# Patient Record
Sex: Female | Born: 1960
Health system: Southern US, Community
[De-identification: ages and names within clinical notes are randomized; demographics above are authoritative.]

## PROBLEM LIST (undated history)

## (undated) ENCOUNTER — Emergency Department (HOSPITAL_COMMUNITY): Payer: Medicare Other

## (undated) DIAGNOSIS — I739 Peripheral vascular disease, unspecified: Secondary | ICD-10-CM

## (undated) DIAGNOSIS — C801 Malignant (primary) neoplasm, unspecified: Secondary | ICD-10-CM

## (undated) DIAGNOSIS — I10 Essential (primary) hypertension: Secondary | ICD-10-CM

## (undated) DIAGNOSIS — E785 Hyperlipidemia, unspecified: Secondary | ICD-10-CM

## (undated) DIAGNOSIS — I2510 Atherosclerotic heart disease of native coronary artery without angina pectoris: Secondary | ICD-10-CM

## (undated) DIAGNOSIS — E0500 Thyrotoxicosis with diffuse goiter without thyrotoxic crisis or storm: Secondary | ICD-10-CM

## (undated) DIAGNOSIS — D6859 Other primary thrombophilia: Secondary | ICD-10-CM

## (undated) HISTORY — PX: COLON SURGERY: SHX602

## (undated) HISTORY — PX: ABDOMINAL HYSTERECTOMY: SHX81

## (undated) HISTORY — PX: ABDOMINAL SURGERY: SHX537

## (undated) HISTORY — DX: Malignant (primary) neoplasm, unspecified: C80.1

## (undated) HISTORY — DX: Hyperlipidemia, unspecified: E78.5

## (undated) HISTORY — DX: Peripheral vascular disease, unspecified: I73.9

---

## 1998-11-01 ENCOUNTER — Other Ambulatory Visit: Admission: RE | Admit: 1998-11-01 | Discharge: 1998-11-01 | Payer: Self-pay | Admitting: Obstetrics & Gynecology

## 1999-04-15 ENCOUNTER — Emergency Department (HOSPITAL_COMMUNITY): Admission: EM | Admit: 1999-04-15 | Discharge: 1999-04-15 | Payer: Self-pay | Admitting: Emergency Medicine

## 1999-07-20 ENCOUNTER — Encounter (INDEPENDENT_AMBULATORY_CARE_PROVIDER_SITE_OTHER): Payer: Self-pay | Admitting: Specialist

## 1999-07-20 ENCOUNTER — Other Ambulatory Visit: Admission: RE | Admit: 1999-07-20 | Discharge: 1999-07-20 | Payer: Self-pay | Admitting: *Deleted

## 1999-10-03 ENCOUNTER — Ambulatory Visit (HOSPITAL_COMMUNITY): Admission: RE | Admit: 1999-10-03 | Discharge: 1999-10-03 | Payer: Self-pay | Admitting: Internal Medicine

## 1999-10-03 ENCOUNTER — Encounter: Payer: Self-pay | Admitting: Internal Medicine

## 1999-12-06 ENCOUNTER — Other Ambulatory Visit: Admission: RE | Admit: 1999-12-06 | Discharge: 1999-12-06 | Payer: Self-pay | Admitting: Obstetrics & Gynecology

## 2001-01-06 ENCOUNTER — Other Ambulatory Visit: Admission: RE | Admit: 2001-01-06 | Discharge: 2001-01-06 | Payer: Self-pay | Admitting: Obstetrics & Gynecology

## 2002-01-12 ENCOUNTER — Ambulatory Visit (HOSPITAL_COMMUNITY): Admission: RE | Admit: 2002-01-12 | Discharge: 2002-01-12 | Payer: Self-pay | Admitting: Internal Medicine

## 2002-01-13 ENCOUNTER — Encounter: Payer: Self-pay | Admitting: Internal Medicine

## 2002-01-14 ENCOUNTER — Ambulatory Visit (HOSPITAL_COMMUNITY): Admission: RE | Admit: 2002-01-14 | Discharge: 2002-01-14 | Payer: Self-pay | Admitting: Internal Medicine

## 2002-01-14 ENCOUNTER — Encounter: Payer: Self-pay | Admitting: Internal Medicine

## 2002-02-23 ENCOUNTER — Other Ambulatory Visit: Admission: RE | Admit: 2002-02-23 | Discharge: 2002-02-23 | Payer: Self-pay | Admitting: Obstetrics & Gynecology

## 2003-04-09 ENCOUNTER — Encounter: Admission: RE | Admit: 2003-04-09 | Discharge: 2003-04-09 | Payer: Self-pay | Admitting: Gastroenterology

## 2003-04-09 ENCOUNTER — Encounter: Payer: Self-pay | Admitting: Gastroenterology

## 2003-04-27 ENCOUNTER — Other Ambulatory Visit: Admission: RE | Admit: 2003-04-27 | Discharge: 2003-04-27 | Payer: Self-pay | Admitting: Obstetrics & Gynecology

## 2003-08-23 ENCOUNTER — Ambulatory Visit (HOSPITAL_COMMUNITY): Admission: RE | Admit: 2003-08-23 | Discharge: 2003-08-23 | Payer: Self-pay | Admitting: Gastroenterology

## 2003-08-23 ENCOUNTER — Encounter (INDEPENDENT_AMBULATORY_CARE_PROVIDER_SITE_OTHER): Payer: Self-pay | Admitting: Specialist

## 2003-09-18 ENCOUNTER — Emergency Department (HOSPITAL_COMMUNITY): Admission: EM | Admit: 2003-09-18 | Discharge: 2003-09-18 | Payer: Self-pay | Admitting: Emergency Medicine

## 2003-09-20 ENCOUNTER — Inpatient Hospital Stay (HOSPITAL_COMMUNITY): Admission: EM | Admit: 2003-09-20 | Discharge: 2003-09-22 | Payer: Self-pay | Admitting: Emergency Medicine

## 2003-09-21 ENCOUNTER — Encounter (INDEPENDENT_AMBULATORY_CARE_PROVIDER_SITE_OTHER): Payer: Self-pay | Admitting: *Deleted

## 2003-10-13 ENCOUNTER — Encounter (INDEPENDENT_AMBULATORY_CARE_PROVIDER_SITE_OTHER): Payer: Self-pay | Admitting: Specialist

## 2003-10-13 ENCOUNTER — Ambulatory Visit (HOSPITAL_COMMUNITY): Admission: RE | Admit: 2003-10-13 | Discharge: 2003-10-13 | Payer: Self-pay | Admitting: Gastroenterology

## 2003-10-20 ENCOUNTER — Ambulatory Visit (HOSPITAL_COMMUNITY): Admission: RE | Admit: 2003-10-20 | Discharge: 2003-10-20 | Payer: Self-pay | Admitting: Gastroenterology

## 2003-11-09 ENCOUNTER — Ambulatory Visit (HOSPITAL_COMMUNITY): Admission: RE | Admit: 2003-11-09 | Discharge: 2003-11-09 | Payer: Self-pay | Admitting: Gastroenterology

## 2003-11-15 ENCOUNTER — Encounter: Payer: Self-pay | Admitting: Gastroenterology

## 2003-11-16 ENCOUNTER — Ambulatory Visit (HOSPITAL_COMMUNITY): Admission: RE | Admit: 2003-11-16 | Discharge: 2003-11-16 | Payer: Self-pay | Admitting: Gastroenterology

## 2003-11-22 ENCOUNTER — Inpatient Hospital Stay (HOSPITAL_COMMUNITY): Admission: EM | Admit: 2003-11-22 | Discharge: 2003-12-11 | Payer: Self-pay | Admitting: Emergency Medicine

## 2003-11-22 HISTORY — PX: OTHER SURGICAL HISTORY: SHX169

## 2004-05-12 ENCOUNTER — Other Ambulatory Visit: Admission: RE | Admit: 2004-05-12 | Discharge: 2004-05-12 | Payer: Self-pay | Admitting: Obstetrics & Gynecology

## 2006-03-01 ENCOUNTER — Encounter: Admission: RE | Admit: 2006-03-01 | Discharge: 2006-03-01 | Payer: Self-pay | Admitting: Vascular Surgery

## 2006-04-27 ENCOUNTER — Emergency Department (HOSPITAL_COMMUNITY): Admission: EM | Admit: 2006-04-27 | Discharge: 2006-04-27 | Payer: Self-pay | Admitting: Emergency Medicine

## 2006-05-14 ENCOUNTER — Encounter: Admission: RE | Admit: 2006-05-14 | Discharge: 2006-05-14 | Payer: Self-pay | Admitting: Internal Medicine

## 2007-01-19 HISTORY — PX: OTHER SURGICAL HISTORY: SHX169

## 2007-01-21 ENCOUNTER — Inpatient Hospital Stay (HOSPITAL_COMMUNITY): Admission: EM | Admit: 2007-01-21 | Discharge: 2007-02-12 | Payer: Self-pay | Admitting: Emergency Medicine

## 2007-01-21 ENCOUNTER — Ambulatory Visit: Payer: Self-pay | Admitting: Vascular Surgery

## 2007-02-27 ENCOUNTER — Inpatient Hospital Stay (HOSPITAL_COMMUNITY): Admission: EM | Admit: 2007-02-27 | Discharge: 2007-03-05 | Payer: Self-pay | Admitting: Emergency Medicine

## 2007-02-27 ENCOUNTER — Ambulatory Visit: Payer: Self-pay | Admitting: Vascular Surgery

## 2007-03-10 ENCOUNTER — Ambulatory Visit: Payer: Self-pay | Admitting: Internal Medicine

## 2007-03-10 ENCOUNTER — Inpatient Hospital Stay (HOSPITAL_COMMUNITY): Admission: EM | Admit: 2007-03-10 | Discharge: 2007-04-10 | Payer: Self-pay | Admitting: Emergency Medicine

## 2007-03-23 ENCOUNTER — Encounter (INDEPENDENT_AMBULATORY_CARE_PROVIDER_SITE_OTHER): Payer: Self-pay | Admitting: Surgery

## 2007-03-28 ENCOUNTER — Ambulatory Visit: Payer: Self-pay | Admitting: Vascular Surgery

## 2007-05-07 ENCOUNTER — Encounter: Admission: RE | Admit: 2007-05-07 | Discharge: 2007-05-07 | Payer: Self-pay | Admitting: Vascular Surgery

## 2007-05-07 ENCOUNTER — Ambulatory Visit: Payer: Self-pay | Admitting: Vascular Surgery

## 2007-09-16 ENCOUNTER — Encounter: Admission: RE | Admit: 2007-09-16 | Discharge: 2007-09-16 | Payer: Self-pay | Admitting: Internal Medicine

## 2007-10-29 ENCOUNTER — Ambulatory Visit: Payer: Self-pay | Admitting: Vascular Surgery

## 2008-04-14 IMAGING — CR DG SMALL BOWEL
7 series · 7 of 7 positions shown · non-contrast
Comparison: none

CLINICAL DATA: Patient with clotting disorder with mesenteric thrombosis with recent surgery to relieve small bowel adhesions.  
SMALL BOWEL FOLLOW-THROUGH:

[view not recorded (1 of 7)]
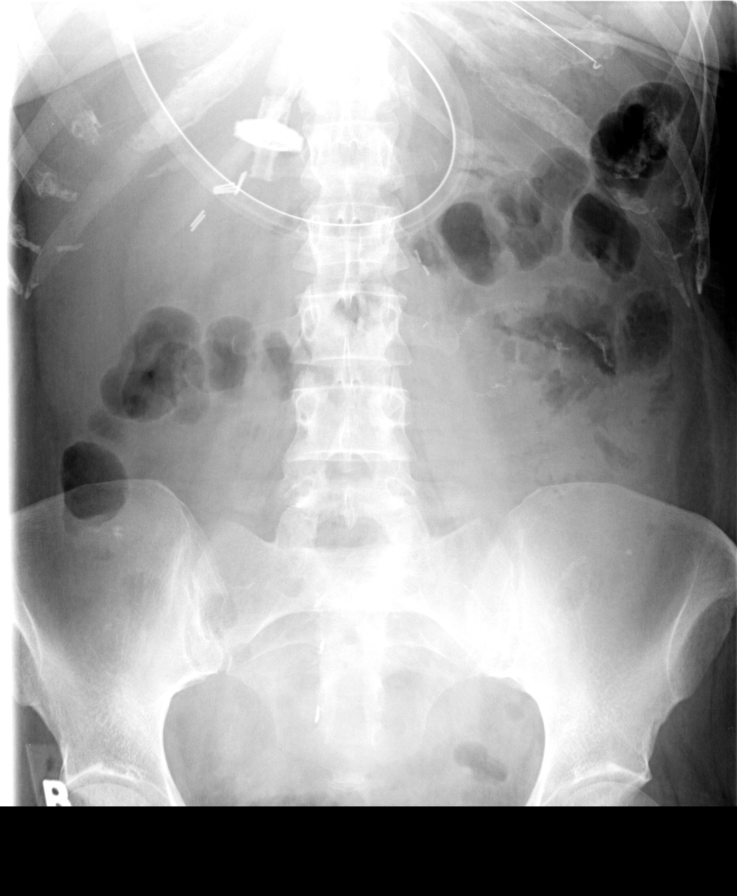

[view not recorded (2 of 7)]
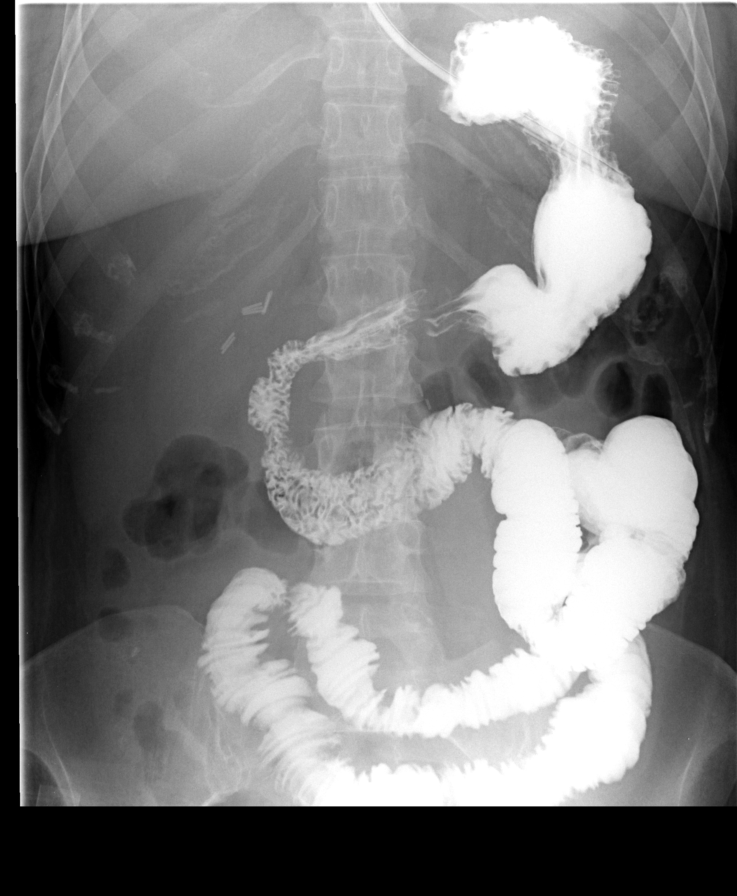

[view not recorded (3 of 7)]
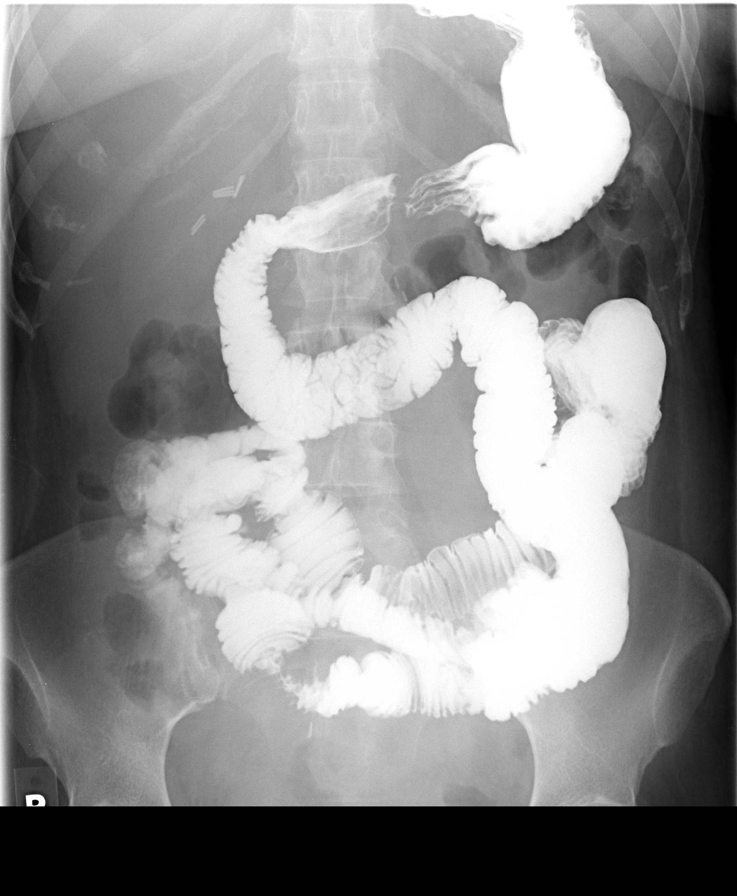

[view not recorded (4 of 7)]
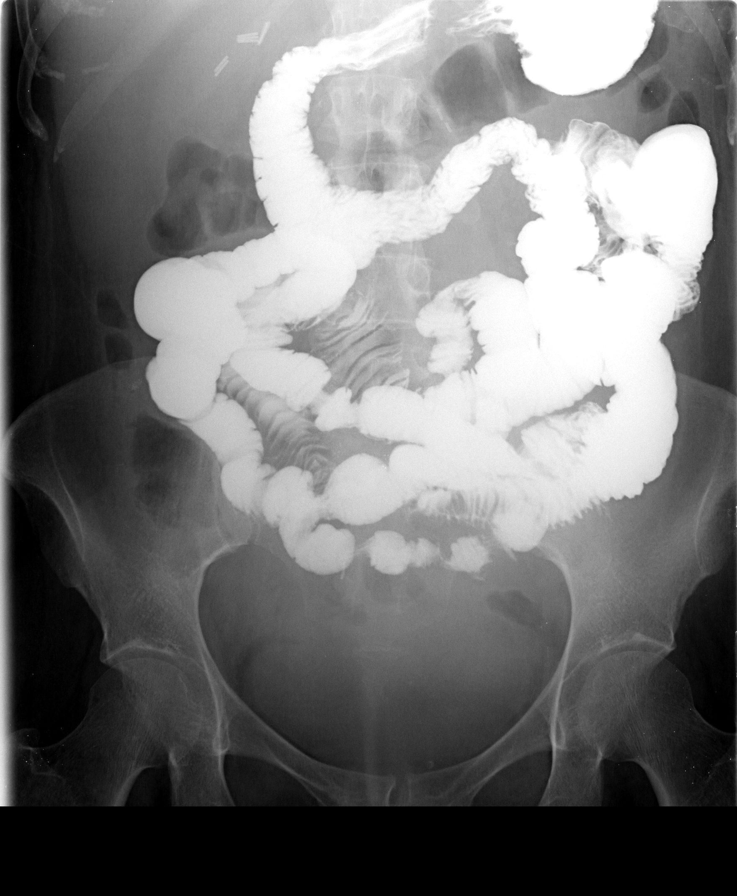

[view not recorded (5 of 7)]
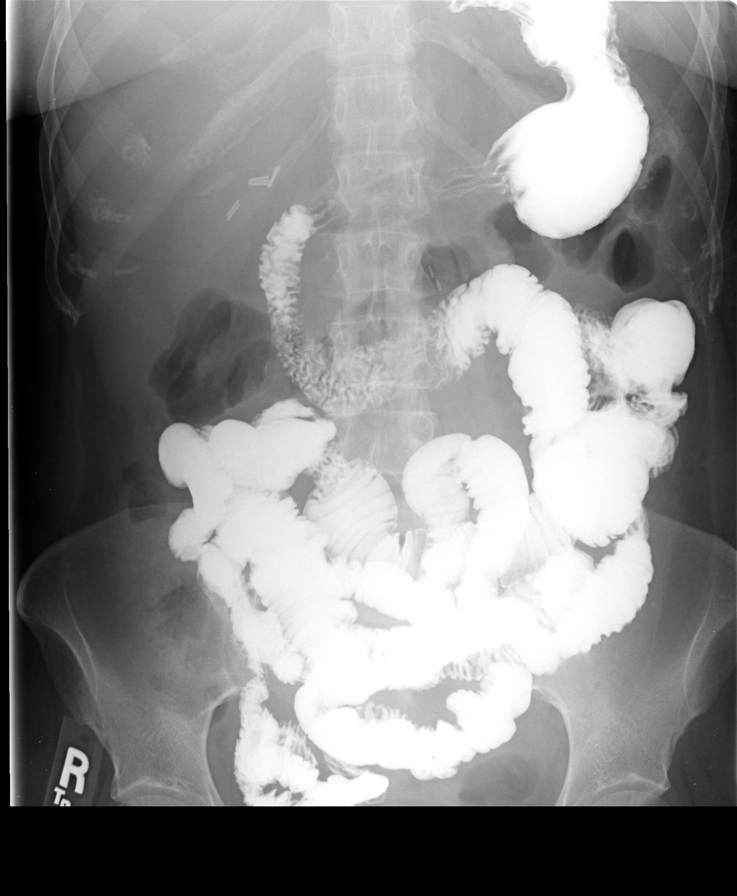

[view not recorded (6 of 7)]
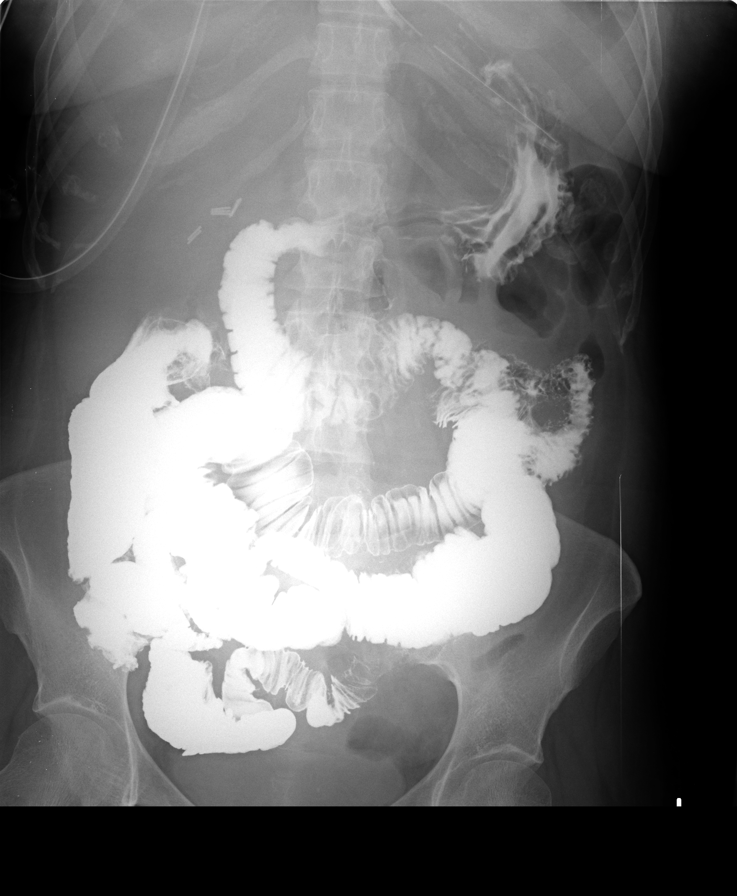

[view not recorded (7 of 7)]
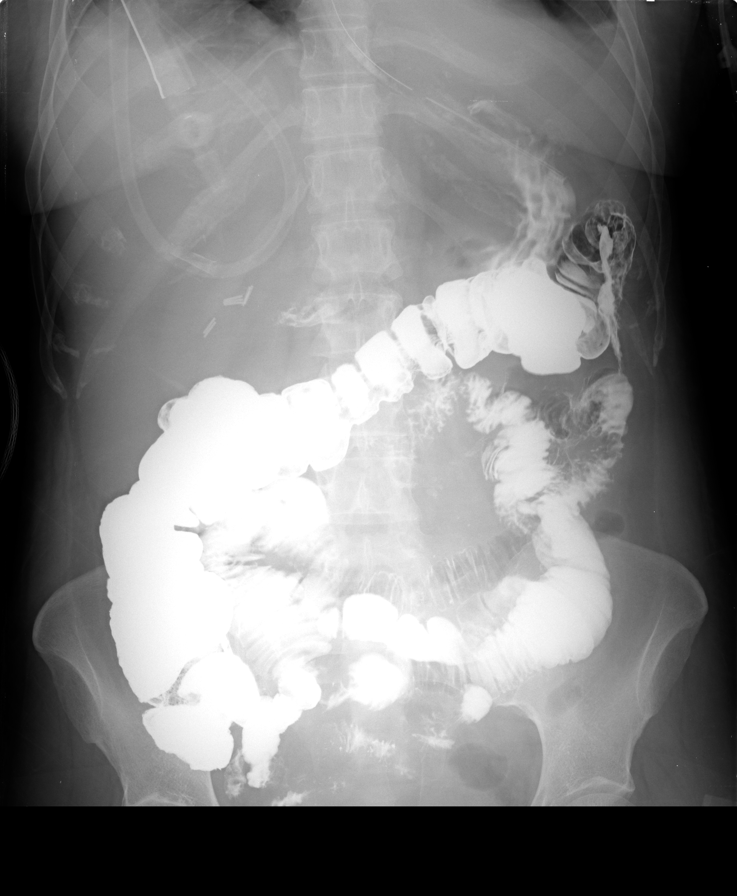

[7 of 7 positions shown; findings below may reference images not displayed]

FINDINGS: As per request of Dr. Urgesi, [place] bowel follow-through was performed via injection through nasogastric tube.  Imaging was performed over 2 hours and 15 minutes in the [HOSPITAL], at which point delayed imaging of the abdomen was performed portably secondary to the slow transit time.   Mild prominence of caliber of proximal small bowel loops initially with normal caliber of distal small bowel loops.  A point of narrowing/obstruction is not identified.  The slow transit time and initially slightly prominent small bowel loops may be related to mild ileus as versus a mild partial obstruction.
IMPRESSION: Slow transit time.  This may be related to a mild ileus as versus mild partial obstruction.

## 2008-04-15 IMAGING — CR DG ABDOMEN 2V
2 series · 2 of 2 positions shown · non-contrast
Comparison: Small bowel followthrough dated 04/01/07 and comparison chest x-ray 03/25/07.

CLINICAL DATA: Follow-up barium.  Patient status post surgery for small bowel obstruction. 
ABDOMEN - 2 VIEW:

[w abdomen upright]
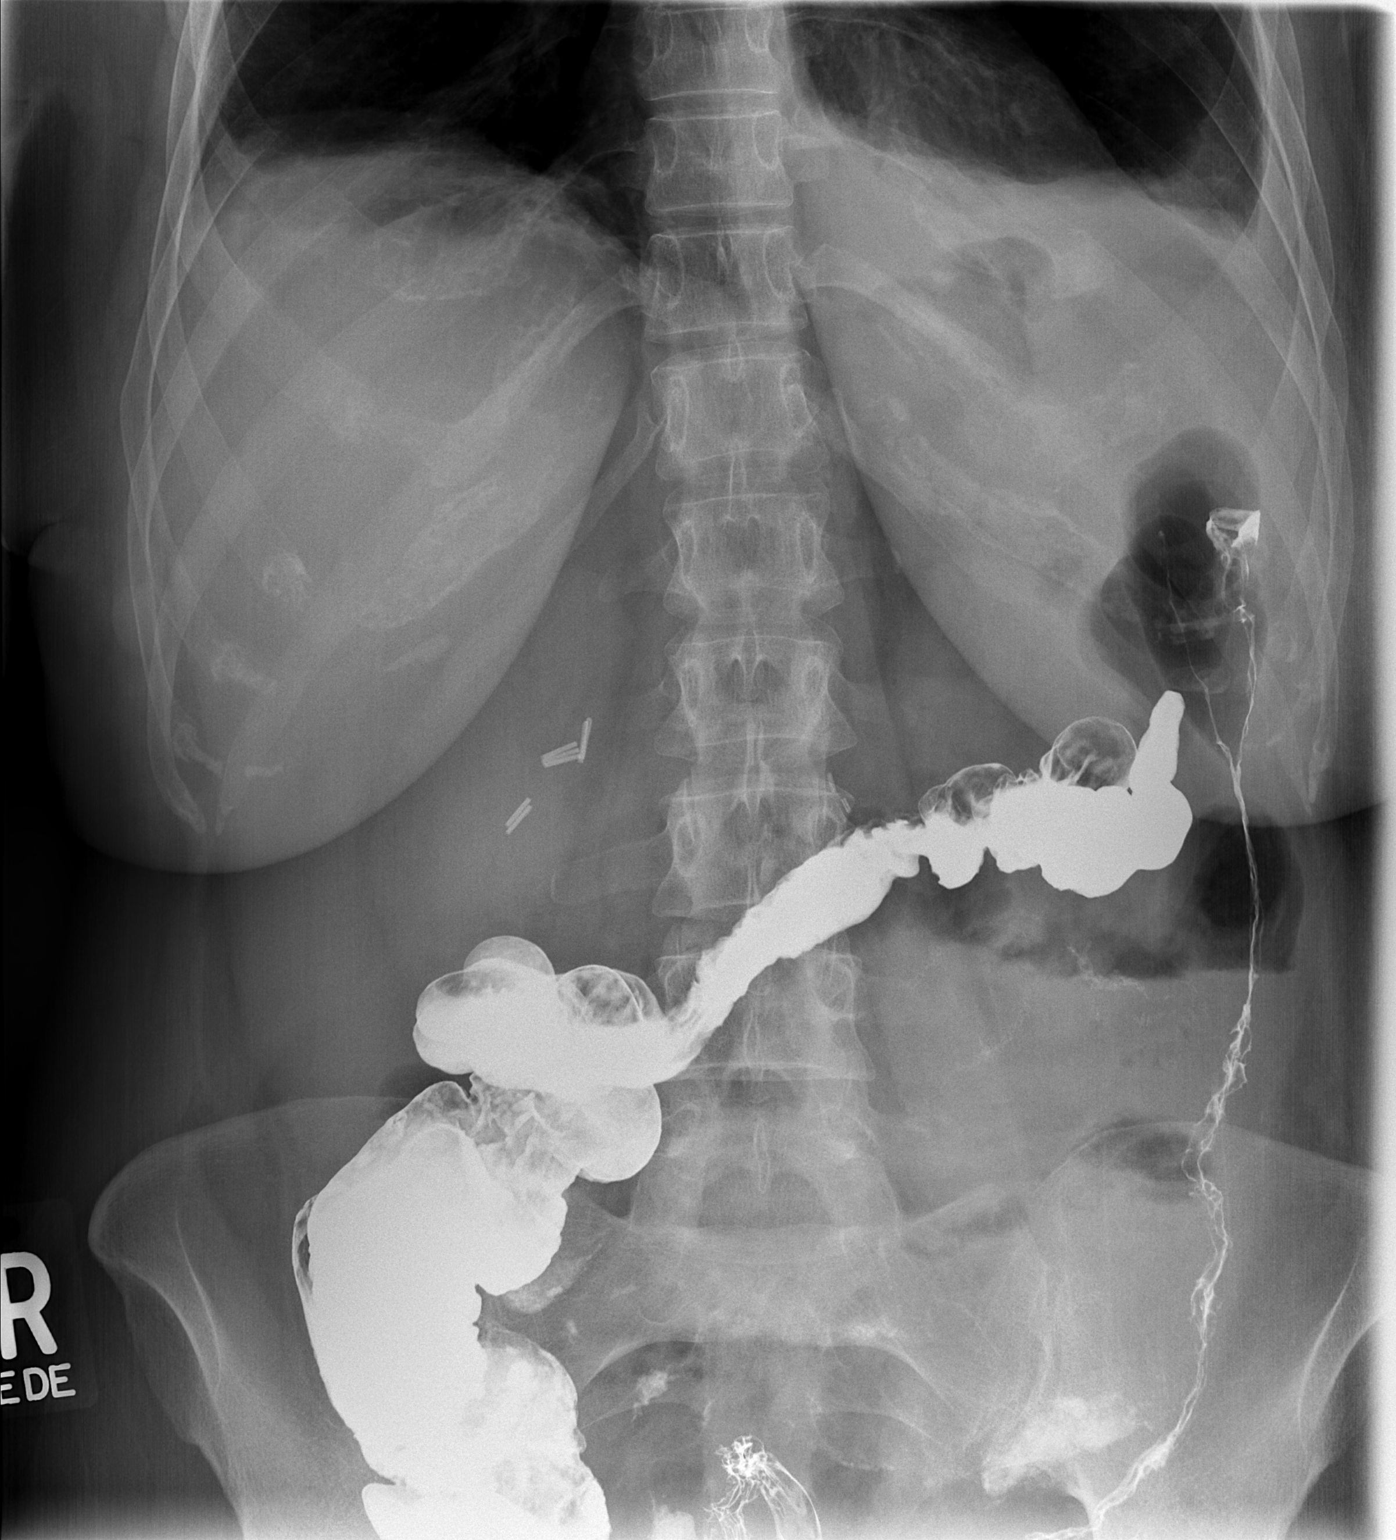

[t abdomen supine]
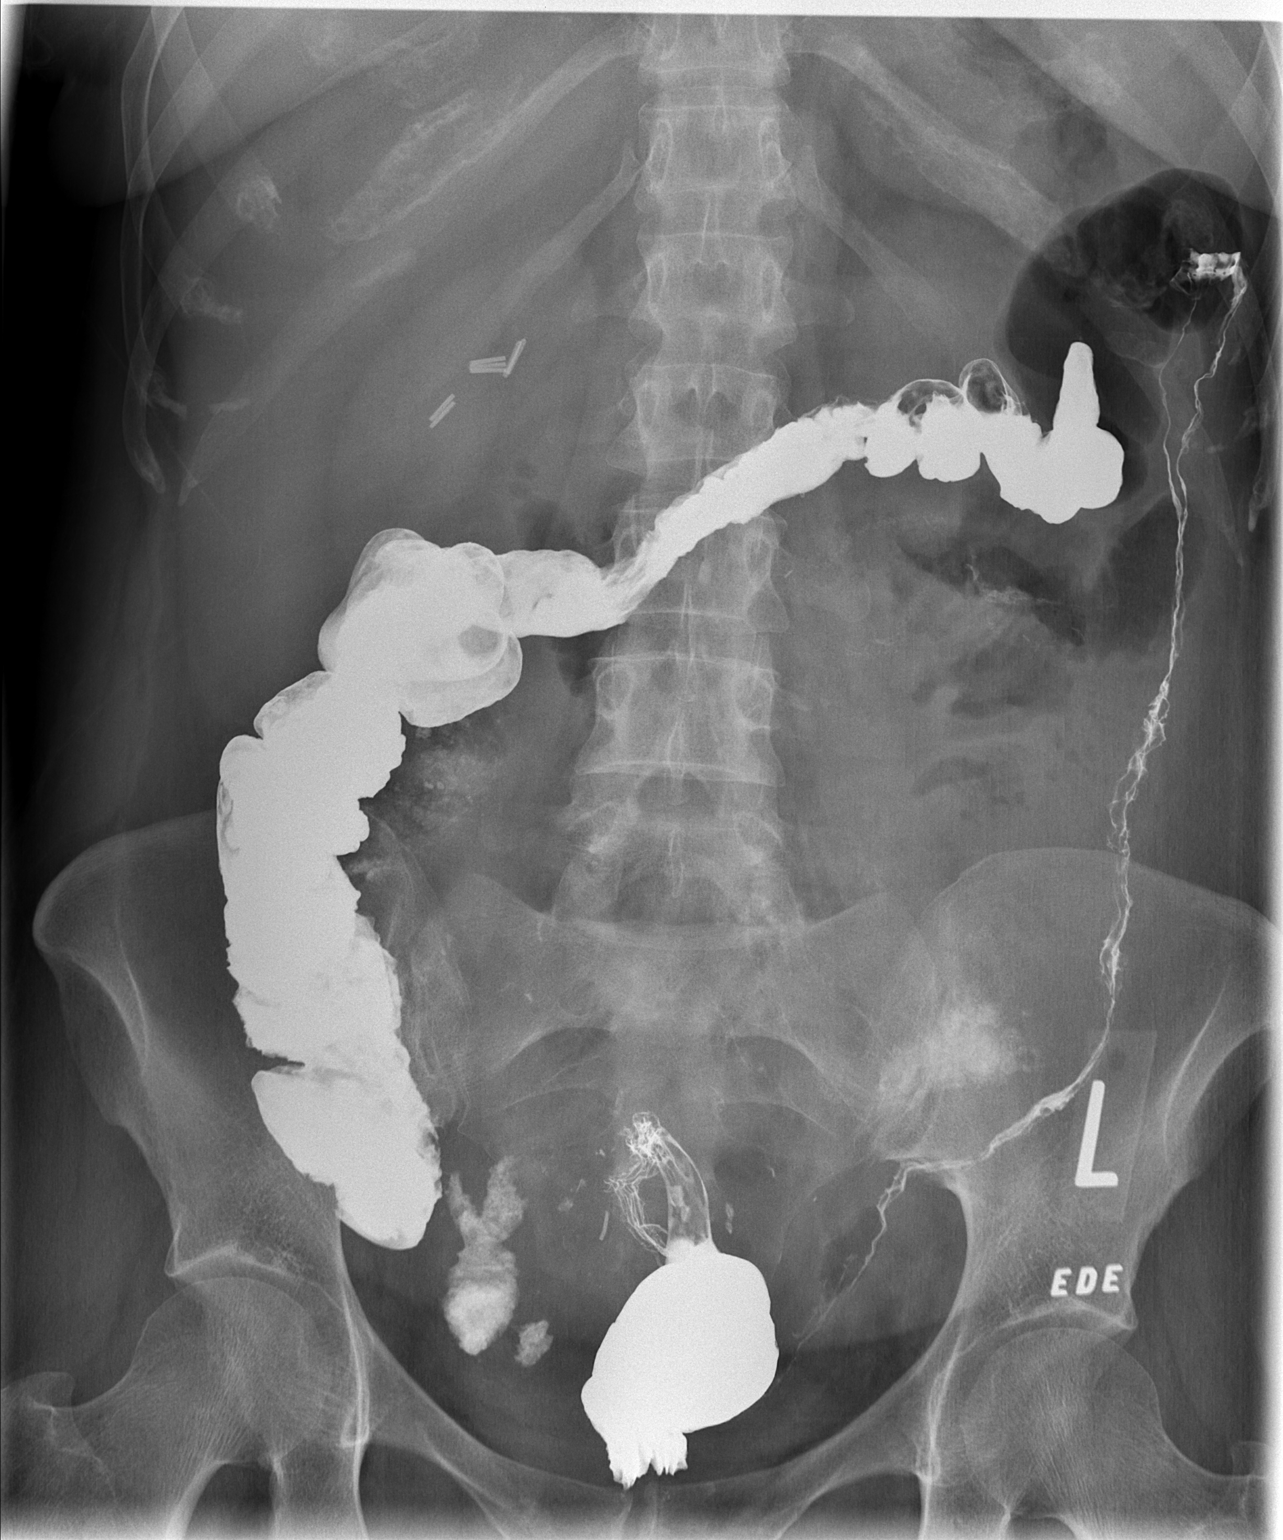

[2 of 2 positions shown; findings below may reference images not displayed]

FINDINGS: Barium has for the most part, traversed into the colon with the majority of this contained within the ascending and transverse colon.  There is a small amount of residual contrast within the left lower quadrant overlying the sacroiliac joint region.  ecommend follow-up until this has cleared to confirm this is in small bowel and not extraluminal.  
Bibasilar atelectatic changes and pleural effusion suspected limiting detection for free intraperitoneal air.    If this were of concern, left side down/right side up decubitus view of the abdomen recommended.  The floor has been contacted.  A call has been made to Dr. Kaki.
IMPRESSION: 1.  Contrast has traversed for the most part into the colon with a small amount remaining within the small bowel. Recommend follow-up until the contrast in the small bowel has completely cleared. 
2.  Bibasilar atelectatic changes and pleural effusions suspected limiting detection for free intraperitoneal air.  Please see above.

## 2008-05-05 ENCOUNTER — Encounter: Admission: RE | Admit: 2008-05-05 | Discharge: 2008-05-05 | Payer: Self-pay | Admitting: Vascular Surgery

## 2008-05-05 ENCOUNTER — Ambulatory Visit: Payer: Self-pay | Admitting: Vascular Surgery

## 2008-05-27 ENCOUNTER — Inpatient Hospital Stay (HOSPITAL_COMMUNITY): Admission: AD | Admit: 2008-05-27 | Discharge: 2008-05-30 | Payer: Self-pay | Admitting: Psychiatry

## 2008-05-27 ENCOUNTER — Ambulatory Visit: Payer: Self-pay | Admitting: Psychiatry

## 2008-07-14 ENCOUNTER — Encounter: Admission: RE | Admit: 2008-07-14 | Discharge: 2008-07-14 | Payer: Self-pay | Admitting: Internal Medicine

## 2008-08-02 ENCOUNTER — Other Ambulatory Visit: Admission: RE | Admit: 2008-08-02 | Discharge: 2008-08-02 | Payer: Self-pay | Admitting: Obstetrics and Gynecology

## 2008-10-29 ENCOUNTER — Ambulatory Visit: Payer: Self-pay | Admitting: Vascular Surgery

## 2008-12-27 ENCOUNTER — Encounter: Admission: RE | Admit: 2008-12-27 | Discharge: 2008-12-27 | Payer: Self-pay | Admitting: Internal Medicine

## 2009-05-11 ENCOUNTER — Ambulatory Visit: Payer: Self-pay | Admitting: Vascular Surgery

## 2009-06-29 ENCOUNTER — Encounter: Admission: RE | Admit: 2009-06-29 | Discharge: 2009-06-29 | Payer: Self-pay | Admitting: Internal Medicine

## 2009-08-08 ENCOUNTER — Other Ambulatory Visit: Admission: RE | Admit: 2009-08-08 | Discharge: 2009-08-08 | Payer: Self-pay | Admitting: Obstetrics and Gynecology

## 2009-11-02 ENCOUNTER — Ambulatory Visit: Payer: Self-pay | Admitting: Vascular Surgery

## 2009-12-21 ENCOUNTER — Observation Stay (HOSPITAL_COMMUNITY): Admission: EM | Admit: 2009-12-21 | Discharge: 2009-12-22 | Payer: Self-pay | Admitting: Emergency Medicine

## 2009-12-22 ENCOUNTER — Encounter (INDEPENDENT_AMBULATORY_CARE_PROVIDER_SITE_OTHER): Payer: Self-pay | Admitting: Internal Medicine

## 2010-02-02 ENCOUNTER — Ambulatory Visit (HOSPITAL_COMMUNITY): Admission: RE | Admit: 2010-02-02 | Discharge: 2010-02-02 | Payer: Self-pay | Admitting: Cardiology

## 2010-02-09 ENCOUNTER — Observation Stay (HOSPITAL_COMMUNITY): Admission: RE | Admit: 2010-02-09 | Discharge: 2010-02-10 | Payer: Self-pay | Admitting: Cardiology

## 2010-02-17 HISTORY — PX: OTHER SURGICAL HISTORY: SHX169

## 2010-05-04 ENCOUNTER — Ambulatory Visit: Payer: Self-pay | Admitting: Vascular Surgery

## 2010-09-09 ENCOUNTER — Encounter: Payer: Self-pay | Admitting: Gastroenterology

## 2010-09-09 ENCOUNTER — Encounter: Payer: Self-pay | Admitting: Vascular Surgery

## 2010-09-10 ENCOUNTER — Encounter: Payer: Self-pay | Admitting: Vascular Surgery

## 2010-10-10 DIAGNOSIS — Z48812 Encounter for surgical aftercare following surgery on the circulatory system: Secondary | ICD-10-CM

## 2010-11-05 LAB — URINALYSIS, ROUTINE W REFLEX MICROSCOPIC
Ketones, ur: NEGATIVE mg/dL
Nitrite: NEGATIVE
Protein, ur: NEGATIVE mg/dL
Urobilinogen, UA: 0.2 mg/dL (ref 0.0–1.0)

## 2010-11-05 LAB — CBC
HCT: 36 % (ref 36.0–46.0)
Hemoglobin: 12.3 g/dL (ref 12.0–15.0)
MCHC: 34.2 g/dL (ref 30.0–36.0)

## 2010-11-05 LAB — PROTIME-INR
INR: 1.65 — ABNORMAL HIGH (ref 0.00–1.49)
INR: 1.79 — ABNORMAL HIGH (ref 0.00–1.49)
INR: 3 — ABNORMAL HIGH (ref 0.00–1.49)
Prothrombin Time: 19.4 seconds — ABNORMAL HIGH (ref 11.6–15.2)

## 2010-11-05 LAB — URINE CULTURE: Culture: NO GROWTH

## 2010-11-05 LAB — APTT: aPTT: 31 seconds (ref 24–37)

## 2010-11-05 LAB — BASIC METABOLIC PANEL
CO2: 27 mEq/L (ref 19–32)
Chloride: 106 mEq/L (ref 96–112)
GFR calc non Af Amer: >60 mL/min (ref >60)
Glucose, Bld: 120 mg/dL — ABNORMAL HIGH (ref 70–99)
Potassium: 4 mEq/L (ref 3.5–5.1)
Sodium: 140 mEq/L (ref 135–145)

## 2010-11-07 LAB — DIFFERENTIAL
Lymphocytes Relative: 22 % (ref 12–46)
Monocytes Absolute: 0.4 10*3/uL (ref 0.1–1.0)
Monocytes Relative: 3 % (ref 3–12)
Neutro Abs: 8.8 10*3/uL — ABNORMAL HIGH (ref 1.7–7.7)

## 2010-11-07 LAB — CARDIAC PANEL(CRET KIN+CKTOT+MB+TROPI)
CK, MB: 0.6 ng/mL (ref 0.3–4.0)
CK, MB: 0.7 ng/mL (ref 0.3–4.0)
Relative Index: INVALID (ref 0.0–2.5)
Troponin I: 0.04 ng/mL (ref 0.00–0.06)

## 2010-11-07 LAB — PROTEIN S, TOTAL: Protein S Ag, Total: 89 % (ref 70–140)

## 2010-11-07 LAB — CK TOTAL AND CKMB (NOT AT ARMC)
CK, MB: 1 ng/mL (ref 0.3–4.0)
Total CK: 95 U/L (ref 7–177)

## 2010-11-07 LAB — MAGNESIUM: Magnesium: 2.4 mg/dL (ref 1.5–2.5)

## 2010-11-07 LAB — POCT CARDIAC MARKERS
CKMB, poc: <1 ng/mL — ABNORMAL LOW (ref 1.0–8.0)
CKMB, poc: <1 ng/mL — ABNORMAL LOW (ref 1.0–8.0)
Myoglobin, poc: 43.2 ng/mL (ref 12–200)
Troponin i, poc: <0.05 ng/mL (ref 0.00–0.09)

## 2010-11-07 LAB — PROTEIN S ACTIVITY: Protein S Activity: 27 % — ABNORMAL LOW (ref 69–129)

## 2010-11-07 LAB — POCT I-STAT, CHEM 8
Chloride: 105 mEq/L (ref 96–112)
Creatinine, Ser: 0.7 mg/dL (ref 0.4–1.2)
Glucose, Bld: 89 mg/dL (ref 70–99)
Potassium: 3.8 mEq/L (ref 3.5–5.1)

## 2010-11-07 LAB — PROTIME-INR
INR: 2.46 — ABNORMAL HIGH (ref 0.00–1.49)
Prothrombin Time: 26.5 seconds — ABNORMAL HIGH (ref 11.6–15.2)

## 2010-11-07 LAB — CARDIOLIPIN ANTIBODIES, IGG, IGM, IGA
Anticardiolipin IgA: 3 APL U/mL — ABNORMAL LOW (ref <22)
Anticardiolipin IgG: 7 GPL U/mL — ABNORMAL LOW (ref <23)

## 2010-11-07 LAB — ETHANOL: Alcohol, Ethyl (B): <5 mg/dL (ref 0–10)

## 2010-11-07 LAB — LIPID PANEL
LDL Cholesterol: 132 mg/dL — ABNORMAL HIGH (ref 0–99)
Total CHOL/HDL Ratio: 7.9 RATIO
VLDL: 76 mg/dL — ABNORMAL HIGH (ref 0–40)

## 2010-11-07 LAB — CBC
Hemoglobin: 14.8 g/dL (ref 12.0–15.0)
MCHC: 34.9 g/dL (ref 30.0–36.0)
RBC: 4.4 MIL/uL (ref 3.87–5.11)

## 2010-11-07 LAB — LUPUS ANTICOAGULANT PANEL
Lupus Anticoagulant: DETECTED — AB
PTTLA 4:1 Mix: 44.3 secs (ref 36.3–48.8)

## 2010-11-07 LAB — T3: T3, Total: 74.7 ng/dl — ABNORMAL LOW (ref 80.0–204.0)

## 2010-11-07 LAB — ANTITHROMBIN III: AntiThromb III Func: 102 % (ref 76–126)

## 2010-11-07 LAB — T4, FREE: Free T4: 1.09 ng/dL (ref 0.80–1.80)

## 2010-11-07 LAB — CULTURE, BLOOD (ROUTINE X 2)

## 2010-11-07 LAB — BETA-2-GLYCOPROTEIN I ABS, IGG/M/A: Beta-2-Glycoprotein I IgA: 15 A Units (ref <20)

## 2011-01-02 NOTE — Procedures (Signed)
MESENTERIC ARTERIAL DUPLEX EVALUATION   INDICATION:  Follow up aorta-SMA bypass graft.  Known aorta-iliac bypass  graft occlusion.   HISTORY:  Diabetes:  No  Cardiac:  No  Hypertension:  Yes  Smoking:  Yes   Mesenteric Duplex Findings:  Aorta - Proximal                            73  Aorta - Mid                                 75  Aorta - Distal                              94   Celiac Trunk - Proximal                     Occluded  Celiac Trunk - Distal                       Occluded   Hepatic Artery                              Not evaluated  Splenic Artery                              Not evaluated   Superior Mesenteric Artery-Origin           80  Superior Mesenteric Artery-Proximal         229  Superior Mesenteric Artery-Mid              99  Superior Mesenteric Artery-Distal           103   Inferior Mesenteric Artery-Proximal         632   IMPRESSION:  1. Patent aorta to SMA bypass graft with stable elevated velocities at      proximal portion of the bypass graft of 229 cm/s, which may be due      to tortuosity.  2. Known high-grade inferior mesenteric artery stenosis with      velocities of 632 cm/s.  3. Known celiac artery occlusion.  4. No significant changes from previous study.        ___________________________________________  Janetta Hora Fields, MD   AS/MEDQ  D:  05/11/2009  T:  05/11/2009  Job:  [id number]

## 2011-01-02 NOTE — Op Note (Signed)
Danielle Barrett, DECLERCQ NO.:  [id number]   MEDICAL RECORD NO.:  [id number]          PATIENT TYPE:  INP   LOCATION:  2115                         FACILITY:  [facility]   PHYSICIAN:  Janetta Hora. Fields, MD  DATE OF BIRTH:  1961/06/09   DATE OF PROCEDURE:  01/22/2007  DATE OF DISCHARGE:                               OPERATIVE REPORT   PROCEDURE:  Exploratory laparotomy and closure of abdomen.   PREOPERATIVE DIAGNOSIS:  Second look laparotomy to inspect for gut  ischemia.   POSTOPERATIVE DIAGNOSIS:  Second look laparotomy to inspect for gut  ischemia.   ANESTHESIA:  General.   SURGEON:  Charles E. Fields, M.D.   ASSISTANT:  Jerold Coombe, P.A.-C.   INDICATIONS:  The patient is a 50 year old female status post  revascularization of her superior mesenteric artery bypass and revision  yesterday.  She presents for a second laparotomy and closure of her  abdomen.   OPERATIVE FINDINGS:  1. Pale appearance of segment of jejunum but no frankly ischemic      segments of bowel and no gangrenous bowel.  2. Ascites.   OPERATIVE DETAILS:  After obtaining informed consent, the patient was  taken to the operating room.  The patient was placed in the supine  position on the operating table.  After induction of general anesthesia,  the patient's abdomen was prepped and draped in the usual sterile  fashion.  The pre-existing laparotomy incision staples were removed and  I proceeded to inspect the bowel from the ligament of Treitz all the way  down to the ileocecal valve.  There was some pale appearing mid to  distal jejunum, but this had good pulses in the distal arterial tree and  there was no frank gangrene. The dusky appearance of the bowel from the  previous day had completely resolve.  The bowel was essentially pink,  normal, and hyperemic in several segments.  The colon was well perfused  and normal in appearance.  The area of the bypass graft was inspected.  There was  a pulse in the graft. This segment of peritoneum was not  reopened.  There was some oozing from the retroperitoneum down below the  segment of operation, but this was not significant.  There was moderate  ascites in the abdomen.  The bowel was edematous.   Dr. Michaell Cowing was also called in to inspect the bowel and there were no  areas that needed resection at this time.  It was decided that we would  close the abdomen.  The fascia was reapproximated with #1 PDS suture.  The wound was  thoroughly irrigated with normal saline solution and the skin closed  with staples.  The patient tolerated the procedure well and there were  no complications.  Instrument, sponge and needle counts were correct at  the end of the case.  The patient was taken to the recovery room in  stable condition.      Janetta Hora. Fields, MD  Electronically Signed     CEF/MEDQ  D:  01/22/2007  T:  01/22/2007  Job:  [id number] 

## 2011-01-02 NOTE — Procedures (Signed)
MESENTERIC ARTERIAL DUPLEX EVALUATION   INDICATION:  Followup of aorto-SMA BPG.  Known aorto-celiac BPG  occlusion.   HISTORY:  Diabetes:  No.  Cardiac:  No.  Hypertension:  Yes.  Smoking:  Yes, one-half pack per day.   Mesenteric Duplex Findings:  Aorta - Proximal                            85  Aorta - Mid                                 96  Aorta - Distal                              111   Celiac Trunk - Proximal                     Known occlusion  Celiac Trunk - Distal                       Known occlusion   Hepatic Artery                              Not evaluated  Splenic Artery                              275   Superior Mesenteric Artery-Origin           62/16 (BPG prox anastomosis)  Superior Mesenteric Artery-Proximal         65 (BPG)  Superior Mesenteric Artery-Mid              119  Superior Mesenteric Artery-Distal           Not evaluated, bowel gas  Distal anastomosis of BPG                   161  Inferior Mesenteric Artery-Proximal         Proximal, 116      IMPRESSION:  1. Patent Aorto-SMA BPG without evidence of stenosis.  2. Native SMA without evidence of stenosis.  3. Patent IMA.   ___________________________________________  Janetta Hora Fields, MD   PB/MEDQ  D:  10/29/2007  T:  10/29/2007  Job:  [id number]

## 2011-01-02 NOTE — Assessment & Plan Note (Signed)
OFFICE VISIT   Danielle Barrett, Danielle Barrett  DOB:  01/28/1961                                       10/29/2007  ZOXWR#:[id number]   Patient is seen back in followup today.  She previously underwent aorta  to celiac and SMA bypass in 04/05.  The celiac portion of this bypass  has been chronically occluded.  She required thrombectomy of the SMA  bypass in 06/08.  She then later required exploratory laparotomy for  small bowel obstruction by Dr. [name] in 08/08.  Since then, she has been  doing well.  She has gained weight.  She is eating well.  Unfortunately,  she continues to smoke.   PHYSICAL EXAMINATION:  Blood pressure is 113/67 in the left arm.  Pulse  is 66 and regular.  Abdomen is soft and nontender.  Normal bowel sounds.  Chest:  Clear to auscultation.  Cardiac:  Regular rate and rhythm.  She  has 2+ femoral pulses bilaterally.  She has 2+ posterior tibial pulses  bilaterally.  Her abdominal incision is well healed.   She reports she is having normal bowel movements.  She is unfortunately  smoking one pack of cigarettes per day.  Her weight today was 155.8  pounds.   She had an ultrasound of her SMA bypass today which showed no stenosis.  The SMA was widely patent.   Patient has continued to take Coumadin for hypercoagulable state.  I  believe she should continue this life-long.  I counseled her again on  smoking cessation and again informed her that she is at risk of her  bypass reoccluding if she continues to smoke.  I will see her again in  six months time with a CT angiogram at that time for further evaluation  of her bypass graft.   Janetta Hora. Fields, MD  Electronically Signed   CEF/MEDQ  D:  10/29/2007  T:  10/30/2007  Job:  [id number]   cc:   Dr. Clydene Pugh. Malon Kindle., M.D.

## 2011-01-02 NOTE — H&P (Signed)
Danielle Barrett, Danielle Barrett                  ACCOUNT NO.:  [id number]   MEDICAL RECORD NO.:  [id number]          PATIENT TYPE:  INP   LOCATION:  2011                         FACILITY:  [facility]   PHYSICIAN:  Larina Earthly, M.D.    DATE OF BIRTH:  1960/12/12   DATE OF ADMISSION:  02/27/2007  DATE OF DISCHARGE:                              HISTORY & PHYSICAL   ADMISSION DIAGNOSIS:  Partial small bowel obstruction, rule out complete  small bowel obstruction.   HISTORY OF PRESENT ILLNESS:  The patient is a 50 year old white female  with a long history of mesenteric ischemia.  She was recently discharged  from the hospital after a prolonged hospitalization on June 24.  She had  presented with recurrent mesenteric ischemia on June 3 and was taken to  the operating room.  She had a prior supraceliac aorta to SMA and celiac  bypass in April of 2005.  She underwent thrombectomy of this and  revision.  She was taken back to the operating room on day 2 for  exploratory laparotomy resecond look and a closure of the abdomen.  She  was initially maintained on TPN and eventually was fed and was  discharged to home.  She did have decreased platelets on heparin and,  therefore, was placed on Coumadin and was discharged with therapeutic  Coumadin.  She has done well in the several weeks since her discharge.  She, over the last several days, has had nausea, vomiting and also loose  stools.  She reports that she was passing gas up until the day prior to  this admission and had a loose bowel movement on the morning of this  admission.  She reports abdominal pain, which is crampy in nature.  She  is admitted for NG tube decompression, observation and hydration.   PAST MEDICAL HISTORY:  Significant for:  1. Hypothyroidism.  2. Graves disease.  3. Asthma,  4. Coronary artery disease.  5. Mesenteric occlusive disease.  6. Anxiety.   DISCHARGE MEDICATIONS:  On February 11, 2007 were:  1. Percocet 5/325 one to two q.4  hours p.r.n. pain.  2. Coumadin dosing.  3. Toprol-XL 50 mg p.o. daily.  4. Xanax 0.5 mg p.o. daily p.r.n. anxiety.  5. Lexapro 10 mg p.o. q.h.s.  6. Altace 5 mg p.o. daily.  7. Singulair 10 mg p.o. daily.  8. Zyrtec 10 mg p.o. daily.  9. Synthroid 75 mcg 1 p.o. daily.  10.TriCor 145 mg p.o. daily.  11.Flax seed oil 1000 mg p.o. daily.  12.Hydrochlorothiazide 25 mg p.o. daily.  13.Vitamin D 400 mg daily.   ALLERGIES:  ARE TO:  1. HEPARIN.  2. PENICILLIN.  3. CODEINE.  4. EGGS.  5. CHICKEN.   SOCIAL HISTORY:  She is married with 2 grown children.  Does smoke 1  pack of cigarettes per day.   PHYSICAL EXAMINATION:  GENERAL:  Well-developed white female in moderate  distress from her abdominal discomfort.  CHEST:  Clear.  ABDOMINAL EXAM:  Reveals a mild, diffuse tenderness, most particularly  in her left upper quadrant.  She does  not have any rebound tenderness.  Her abdominal wound is well-healed without evidence of abdominal hernia.   LABORATORY DATA:  On admission, white blood cell count is normal at  11.0, hemoglobin and hematocrit 13.0 and 39.0.  Creatinine is 0.7,  potassium is 3.8.  Her INR was 2.9, prothrombin time 31.9, PTT was 57.  Total bilirubin is 1.4, direct bili 0.4, indirect 1.0.  Lipase was 40.  CT scan of her abdomen revealed dilated loops of small bowel with some  fluid in her abdomen as well, which was consistent with the study, CT  scan, from 1 month ago.   IMPRESSION:  Partial small bowel obstruction, rule out complete small  bowel obstruction.   PLAN:  The patient will be admitted for NG tube decompression and  hydration.  She will have her Coumadin.  She will have consultation by  [facility] who saw her during her recent admission as  well.      Larina Earthly, M.D.  Electronically Signed     TFE/MEDQ  D:  02/27/2007  T:  02/28/2007  Job:  [id number]

## 2011-01-02 NOTE — Discharge Summary (Signed)
Danielle Barrett, Danielle Barrett                  ACCOUNT NO.:  [id number]   MEDICAL RECORD NO.:  [id number]          PATIENT TYPE:  INP   LOCATION:  5730                         FACILITY:  [facility]   PHYSICIAN:  Currie Paris, M.D.DATE OF BIRTH:  06/09/1961   DATE OF ADMISSION:  03/10/2007  DATE OF DISCHARGE:  04/10/2007                               DISCHARGE SUMMARY   ADMITTING PHYSICIAN:  Dr. Way[facility].   DISCHARGING PHYSICIAN:  Dr. Jamey Ripa.   CONSULTANTS:  Dr. Ovidio Kin with [facility].   OPERATIVE PHYSICIAN:  Dr. Manus Rudd.   CHIEF COMPLAINT AND REASON FOR ADMISSION:  Danielle Barrett is a 50 year old  female patient well known to our service, history of ischemic bowel  status post superior mesenteric artery bypass grafting in 2005,  subsequent occlusion with thrombosis and mesenteric bypass and revision  in June 2008.  During that hospitalization there was some concern that  the patient may have had some ischemic bowel so a 24-hour reexploration  quick-look was done by vascular surgeon within the assistance of [facility]  doctor-of-the-week physician and no obvious ischemic bowel was noted.  The patient apparently did well, had a mild postoperative ileus and had  difficulty with post prandial pain and bloating.  This was in June.  Danielle Barrett  was readmitted to [facility] on July 10 to Dr. Darrick Penna' service.  We  were consulted once again because of partial small-bowel obstruction.  This seemed to resolve with conservative medical management.  Danielle Barrett was re-  discharged on July 16.  By date of discharge the patient apparently was  eating and having bowel movements and appeared to be doing well.   The patient apparently continued to have bowel movements and did  relatively well without nausea or vomiting after discharge, and had no  problems until early in the morning of July 21 when Danielle Barrett had increased  bloating, nausea and vomiting but was still passing bowel  movements.  Danielle Barrett was initially evaluated by Dr. Amador Cunas in the ER for admission  after being evaluated by the ER physicians.  A CT of the abdomen and  pelvis showed what appeared to be no change in the previously seen  partial small-bowel obstruction from prior films.  Her INR was  therapeutic at 2; her white count was mildly elevated at 14,400.  Her  abdominal exam showed slight tenderness in the epigastric and left mid  abdominal area with apparent normal bowel sounds.   Danielle Barrett was admitted by the medicine service with a diagnosis of:  1. Partial small-bowel obstruction.  2. History of mesenteric artery occlusive disease on anticoagulation.  3. History of heparin-induced thrombocytopenia.  4. History of anxiety disorder.  5. Hypothyroidism with history of Graves disease.   HOSPITAL COURSE:  The patient was admitted as stated.  On date of  admission general surgery services were consulted to follow with the  patient.  Because of the partial small-bowel obstruction Danielle Barrett was, as  noted, placed on IV fluids, n.p.o. status, Coumadin placed on hold, and  serial x-rays were followed.   Through the early part  of the hospitalization the patient was quite slow  to progress.  Danielle Barrett continued to have symptoms consistent with a small-  bowel obstruction.  Dr. Darrick Penna also evaluated the patient early in the  hospitalization and recommended Danielle Barrett stay anticoagulated, and since Danielle Barrett  was n.p.o. and there was potential for surgery, Refludan was started as  a bridge for anticoagulation due to the patient's history of HIT.   Within 3 days after admission the patient seemed to worsen.  Danielle Barrett was  having increasing pain, diffuse tenderness in the abdomen.  Danielle Barrett had  bowel sounds but they were hypoactive and Danielle Barrett was quite distended.  Medications were changed to IV route, a PICC line was inserted to begin  TNA, and an NG tube was inserted.   For the next several days the patient seemed to slowly improve.  Danielle Barrett  had  bowel sounds.  Her abdomen was soft and nontender.  Danielle Barrett began passing  flatus but x-rays were more consistent with a partial small-bowel  obstruction with persistent loops of dilated small-bowel seen in the mid  to upper abdomen.  Danielle Barrett was complaining of crampy gas-like pain, even  though Danielle Barrett was still passing flatus and small BMs.  On March 17, 2007, it  was opted to leave the NG place but allow the patient clear liquids.  The following day Danielle Barrett appeared to be tolerating these without nausea and  vomiting but was having the same crampy pain.  On March 19, 2007, the  patient had significant amounts of gas and Mylicon was started.  Her NG  tube was discontinued.  Danielle Barrett had previously been placed on a full-liquid  diet which Danielle Barrett was tolerating and plans were to advance her to a low-  residue diet and resume her Coumadin in the event Danielle Barrett did well and was  able to discharge home soon.   Unfortunately, the next day the patient began to have emesis during the  night, but it seemed to be more related to the Percocet Danielle Barrett was given by  mouth so her diet was placed back to clear, Danielle Barrett was put on IV  medications for discomfort, and we continued to monitor her.   By March 21, 2007, Danielle Barrett seemed to be better.  Two-view x-ray demonstrated  mild small-bowel distention but improved compared to the film on July  28.  Her low-residue diet was resumed and her pain medications were  readjusted to avoid medications that would cause nausea and vomiting.  Unfortunately, by August 2 Danielle Barrett redeveloped nausea and vomiting and by  August 3 Danielle Barrett was having significant abdominal pain with nausea and  vomiting.  Her Coumadin had been placed on hold the day before because  of the nausea and vomiting.  Her INR was 1.4, PTT was 63 on Refludan.  There was some concern that Danielle Barrett may need exploratory laparotomy so the  Refludan was placed on hold.  Danielle Barrett was typed and crossmatched for 4 units  of packed red blood cells as well as  to have FFP available in the event  urgent operative intervention was indicated.   It was decided late in the evening on March 23, 2007, based on the  patient's symptoms, that Danielle Barrett would benefit from exploratory laparotomy.  Danielle Barrett was taken to the OR by Dr. [name] where Danielle Barrett underwent exploratory  laparotomy with extensive lysis of adhesions with small-bowel resection  x2.  Danielle Barrett was found to have a significant fibrous scar formation in the  small bowel, creating the persistent  partial small-bowel obstruction.   In the postoperative period Danielle Barrett was in the intensive care unit because  of requiring ventilator support in the initial postoperative period.  Danielle Barrett remained on TNA.  An NG tube was in place for bowel rest and  Refludan was reinitiated because of her history of ischemic bowel and  SMA grafting.  Postoperatively, vascular services, Dr. Darrick Penna, continued  to follow with [place].   The patient was able to be extubated by postoperative day #2.  By  postoperative day #3 her hemoglobin had dropped down to 7.4, so 2 units  of packed red blood cells were transfused.  Danielle Barrett continued to have  symptoms related to a postoperative ileus.  Her white count remained  elevated at 14,000 but this was trending downward; previous had been  20,000.  By postoperative day #4 Danielle Barrett was deemed appropriate for transfer  to step-down.  Danielle Barrett continued to have difficulties with a postoperative  ileus but otherwise was stable.  Danielle Barrett remained on TNA for nutritional  support.  On postoperative day #5 her abdomen showed significant areas  of induration and erythema about the incision.  No purulence was  expressed but these areas were quite tender.  Eventually, several  staples were removed.  Wound was cultured showing no growth.  The  patient had been placed empirically on Mefoxin postoperatively.  The  wound was packed and within several days the redness had resolved.  The  only other issues related to the wound had to do with  significant  pooling of serous fluid from peripheral edema.  This was treated  initially with dry packing and once the serous fluid resolved Danielle Barrett was  changed back to Restore 4x4 packing once daily.  By date of discharge  the patient's wound was red and granular and beginning to contract in.  Danielle Barrett had two areas in the upper and in the lower wound.   During this same time period the patient continued have persistent  issues related to a mild postoperative ileus, but by postoperative day  #7 her abdomen was much less distended.  Bowel sounds were present.  Her  NG tube was clamped.  Her Foley was discontinued.  By postoperative day  #8 the patient was having bowel sounds, small BM and flatus, so NG tube  was discontinued and Danielle Barrett was started on sips of clear liquids.  For next  several days the patient continued to improve from an ileus standpoint  and her diet was slowly advanced.  Her Coumadin was reinitiated.  By  postoperative day #11 Danielle Barrett was tolerating a low-residue diet.  In  addition, the patient continued to have persistent problems related to  some low-grade leukocytosis, peaking at 14,200 and then stabilizing out  about 11,000.  Urinalysis was checked and was negative.  The wound was  stable, her lungs were clear, Danielle Barrett was not having fevers, but her PICC  line began to become red and so this was discontinued.  TNA was  discontinued since Danielle Barrett was tolerating a low-residue diet, and within 48  hours after discontinuation of PICC line her white count had normalized  to 9500.   At this point we were waiting for her INR to become therapeutic.  This  was the only thing the patient from being discharged home.  Because of  HIT Danielle Barrett had to remain on Refludan and could not take medication such as  Lovenox at home as bridge for Coumadin anticoagulation, and with her  history of SMA grafting  with associated ischemic bowel and history of  thrombosis requiring graft revision, we did not feel that  it would be  appropriate to send the patient home without a therapeutic INR.  By  postoperative day #17 the patient's INR was 1.9 and was felt that soon  the patient will be ready for discharge home.  Discharge planning was  set in place with arranging home health nursing for wound care and  drawing the patient's PT/INR levels.   By April 10, 2007, the patient's INR was 2.6.  Her hemoglobin was 10.7,  white count 7900.  Vital signs were stable, Danielle Barrett was afebrile, and Danielle Barrett  was saturating 95% on room air.  Danielle Barrett was tolerating solid low-residue  diet.  Abdomen was nontender, her midline wound was continuing to  contract down with red granular healing without any purulent exudate,  and Danielle Barrett was otherwise deemed appropriate for discharge home.   FINAL DISCHARGE DIAGNOSES:  1. Persistent small-bowel obstruction secondary to adhesions with      fibrous stricture.  2. Status post exploratory laparotomy with extensive lysis of      adhesions and small-bowel resection x2.  3. Coumadin anticoagulation for history of antitrypsin III deficiency      and associated prior superior mesenteric artery graft with recent      revision for occlusion om June 2008.  4. History of heparin-induced thrombocytopenia requiring Refludan for      anticoagulation.  5. Hypothyroidism.  6. Hypertension.  7. Anxiety disorder.   DISCHARGE MEDICATIONS:  1. Xanax 0.25 mg t.i.d.  2. Coumadin 7.5 mg daily.  3. Toprol-XL 50 mg daily.  4. Lexapro 10 mg at bedtime.  5. Altace 5 mg daily.  6. Singulair 10 mg daily at bedtime.  7. Zyrtec 10 mg at bedtime.  8. Synthroid 75 mcg daily.  9. Hydrochlorothiazide 25 mg daily.  10.Vitamin D 400 units daily.   New medications:  Vicodin 5/325 one to two every 4 hours as need for pain.   FOLLOWUP:  1. Danielle Barrett needs to call our office to be seen by Dr. [name] in 2 weeks.  2. Danielle Barrett needs to follow up with Dr. Nehemiah Settle in 1 week.  3. Home Health will be following the patient's the wound  care which      will be hydrogel packing once daily, cover with dry dressing.  They      will also obtain PT/INR with results faxed to Dr. Nehemiah Settle on this      Monday.  The patient has also been verbally instructed if Danielle Barrett      begins to have more bleeding from the abdominal wound to notify      either [place] or Dr. Idelle Crouch office in the event we need to place      Coumadin on hold briefly.   RETURN TO WORK:  In 6 weeks from March 23, 2007.  Work notes have been  given to the patient.   DIET:  Low residue for the next 2 weeks.   ACTIVITY:  Increase activity slowly.  May walk up steps.  May shower.  No lifting more than 15 pounds for the next 4 weeks.  No driving while  taking Vicodin.   Final determination regarding return to work per Dr. Manus Rudd.      [name], N.P.      Currie Paris, M.D.  Electronically Signed    ALE/MEDQ  D:  04/10/2007  T:  04/11/2007  Job:  [id number]  cc:   Wilmon Arms. Tsuei, M.D.  Janetta Hora. Darrick Penna, MD  Deirdre Peer. Polite, M.D.

## 2011-01-02 NOTE — H&P (Signed)
Danielle Barrett, BAYLESS NO.:  [id number]   MEDICAL RECORD NO.:  [id number]          PATIENT TYPE:  INP   LOCATION:  5707                         FACILITY:  [facility]   PHYSICIAN:  Gordy Savers, MDDATE OF BIRTH:  1960/09/29   DATE OF ADMISSION:  03/10/2007  DATE OF DISCHARGE:                              HISTORY & PHYSICAL   CHIEF COMPLAINT:  Abdominal pain.   HISTORY OF PRESENT ILLNESS:  The patient is a 50 year old white female  with a history of chronic mesenteric artery occlusive disease.  She is  status post aortoceliac and aortosuperior mesenteric artery bypass in  April 2005.  She has a history of chronic graft occlusion and was  admitted on January 20, 2007, with recurrent bowel ischemia and underwent  thrombectomy of the SMA and graft revision.  Antithrombin III deficiency  was diagnosed at that time and the patient has been on chronic Coumadin.  The hospital admission was complicated by heparin-induced  thrombocytopenia and the patient was discharged on February 10, 2007, and  readmitted on February 27, 2007, for partial small-bowel obstruction.  The  patient was discharged from her last admission on March 05, 2007, and  continue had intermittent crampy abdominal pain sporadically.  At  approximately 3:00 a.m., the patient began having severe abdominal pain  with episodes of recurrent vomiting.  Over the past few days, she states  her bowel movements have been looser than normal but does have an  occasional semi-formed bowel movement.  Due to hurt intractable pain and  recurrent nausea and vomiting, the patient presented to the emergency  department for evaluation.  Evaluation revealed the patient to be  afebrile.  White count was 14.4.  INR therapeutic at 2.  Chemistries  were unremarkable except for random blood sugar 129.  Lipase was normal.  CT of the abdomen and pelvis revealed no significant change in the  partial small-bowel obstruction compared to prior  films.  There was  considerable intra-abdominal postoperative fluid collections and  multiple dilated small bowel loops.  The patient is now admitted with  suspected recurrent partial small-bowel obstruction and to rule out  active intestinal ischemia.   PAST MEDICAL HISTORY:  As mentioned,  1. She has extensive history of mesenteric ischemia.  2. History of antithrombin III deficiency.  3. She has a history of heparin-induced thrombocytopenia.  4. Sporadic tobacco use.   ADDITIONAL DIAGNOSES:  1. Asthma.  2. Anxiety disorder.  3. Hypothyroidism.  4. Status post Graves' disease.  5. Hypertension.  6. History of coronary artery disease.   She is a gravida 2, para 2, abortus 0 with history of two C-sections.  She has also had tubal ligation.   PRESENT MEDICAL REGIMEN:  1. Zyrtec 10 mg daily.  2. Alprazolam.  3. Lexapro 10 mg.  4. Hydrochlorothiazide 25 mg daily.  5. Tricor 145 mg daily.  6. Levothyroxine 0.075 mg daily.  7. Altace 5 mg daily.  8. Singulair 10 mg daily.  9. Percocet p.r.n.  10.Valium p.r.n.  11.Coumadin 7.5 mg daily.   ALLERGIES:  CODEINE, EGGS, HEPARIN  and PENICILLIN.   SOCIAL HISTORY:  She is married, two grown children.  She continues to  smoke sporadically, but very infrequent since her readmission in July.   PHYSICAL EXAMINATION:  VITAL SIGNS:  Blood pressure 130/70, pulse rate  74, respiratory rate 20, temperature 98.1, O2 saturation 98%.  GENERAL APPEARANCE:  A well-developed, pleasant white female in no acute  distress.  SKIN:  Warm and dry without rash.  HEENT:  Normal pupil responses.  Conjunctiva anicteric.  Ear, nose and  throat unremarkable.  Mucous membranes pink and moist.  NECK:  A left supra clavicular bruit.  CHEST:  Clear.  CARDIOVASCULAR:  Quiet precordium.  S1-S2 normal.  No murmur.  ABDOMEN:  A midline scar.  There is slight tenderness in epigastric and  left mid abdominal wall area.  Bowel sounds were fairly normal.  No   guarding or rebound.  EXTREMITIES:  No edema.  The left dorsalis pedis pulse was markedly  diminished.  Other peripheral pulses were intact.   IMPRESSION:  1. Probable recurrent partial small-bowel obstruction.  2. History of mesenteric artery occlusive disease.   ADDITIONAL DIAGNOSES:  1. Antithrombin III deficiency.  2. Hypertension.  3. Sporadic tobacco use.   DISPOSITION:  The patient will be admitted to the hospital.  She will be  placed on bowel rest and treated symptomatically.  The patient will be  seen in consultation by general surgery and vascular surgery.  The  patient will be maintained on Coumadin and INRs followed closely.      Gordy Savers, MD  Electronically Signed     PFK/MEDQ  D:  03/10/2007  T:  03/11/2007  Job:  [id number]

## 2011-01-02 NOTE — Discharge Summary (Signed)
NAMEBLAKELEIGH, DOMEK                  ACCOUNT NO.:  [id number]   MEDICAL RECORD NO.:  [id number]          PATIENT TYPE:  INP   LOCATION:  2011                         FACILITY:  [facility]   PHYSICIAN:  Janetta Hora. Fields, MD  DATE OF BIRTH:  06-May-1961   DATE OF ADMISSION:  02/27/2007  DATE OF DISCHARGE:  03/05/2007                               DISCHARGE SUMMARY   ADMISSION DIAGNOSES:  1. Small bowel obstruction.  2. Small bowel partial.  3. Recent admission for recurrent mesenteric ischemia associated with      occlusion of superior mesenteric artery grafting, chronic occlusion      iliac bypass graft, status post thrombectomy of her superior      mesenteric artery bypass on January 21, 2007.  4. Recent diagnosis of antithrombin 3 deficiency, on chronic Coumadin.   DISCHARGE/SECONDARY DIAGNOSES:  1. Small bowel obstruction, improved.  2. Small bowel partial.  3. Recent admission for recurrent mesenteric ischemia associated with      occlusion of superior mesenteric artery grafting, chronic occlusion      iliac bypass graft, status post thrombectomy of her superior      mesenteric artery bypass on January 21, 2007.  4. Positive fecal occult tests this hospitalization.  5. Hypokalemia/supplemented.   OTHER DISCHARGE/SECONDARY DIAGNOSES:  Include:  1. 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  2. Hyperthyroidism.  3. History of Graves disease.  4. Asthma.  5. Ongoing tobacco abuse.  6. Coronary artery disease.  7. History of anxiety.  8. History of C-section x2.  9. Laparoscopic cholecystectomy in 2002 by Dr. Abbey Chatters for a      calculus that is felt acalculous cholecystitis.  10.Aorta to celiac and superior mesenteric artery bypass by Dr.      Fabienne Bruns on November 22, 2003, requiring exploratory laparotomy      and thrombectomy of the aorta to superior mesenteric artery bypass      and revision of aortic to superior mesenteric  artery bypass distal      anastomosis using an 8 mm DACRON graft on January 24, 2007.   ALLERGIES:  SHE IS ALLERGIC TO:  1. PENICILLIN.  2. CODEINE.  3. EGGS.  4. POULTRY   CONSULTS:  On general surgery, Dr. Elliot Dally [facility]).  Smoking cessation, physical therapy and dietician.   BRIEF HISTORY:  Ms. Lady is a 50 year old Caucasian female with a long  history of mesenteric ischemia.  She was recently discharged from the  hospital after a prolonged hospitalization on June 24, where she  presented with recurrent mesenteric ischemia on June 3 and was taken to  the operating room.  She had a prior supraceliac aorta to SMA and celiac  bypass in April of 2005, by Dr. Fabienne Bruns.  This past June, she  underwent thrombectomy and revision of this.  She was taken back to the  operating room 2 days following this surgery for exploratory laparotomy  for a second look and closure of the abdomen, as she had few areas of  dusky appearing bowel at her initial operation for her recurrent  mesenteric ischemia.  These areas appeared to improve.  She was  initially maintained on TPN and was eventually advanced to a regular  diet prior to discharge.  During hospitalization, a workup, she was  found to have positive HIT antibody and tested positive for antithrombin  3 deficiency and was started on Coumadin therapy.  She had been doing  well for the last 2 weeks; however, the last several days she developed  nausea, vomiting and loose stools.  She had been passing some gas up  until July 9 and had a loose bowel movement the morning of July 10.  She  did describe some abdominal pain, which was crampy in nature.  She was  actually going to see her new family physician on July 10 and described  these symptoms and he contacted on-call vascular surgeon, Gretta Began,  for admission for partial small bowel obstruction versus complete bowel  obstruction.   HOSPITAL COURSE:  Ms. Birnie was  admitted to [facility] on February 27, 2007, with the abdominal pain believed partial versus complete small  bowel obstruction.  She is admitted for NG tube decompression,  observation and hydration, as well as a general surgery consultation.  She was seen by Dr. Claud Kelp initially, who recommended continuing  bowel rest.  Apparently, the patient was unable to tolerate NG tube and  this was left out, unless the patient developed recurrent emesis.  She  was continued on IV hydration and started on Refludan while her Coumadin  was held briefly to ensure that she would not need to go to the  operating room.  She had a CT angiogram of the abdomen and pelvis on  admission, which showed dilated loops of small bowel, concerning for  obstructive process, fluid collection in the left lower quadrant  concerning for a small abscess collection, the largest measuring 4.1 cm.  There was another small abscess cavity in the left upper quadrant, which  had probably decreased in size from her prior study.  She had a patent  supraceliac bypass graft.  There is patency of the distal vessels  similar to the previous CTA examination and probable hepatic and renal  cysts were noted.  There was no significant free fluid in the pelvis.  Based on physical exam findings and diagnostics, it was felt that she  most likely did not have ischemia, but agreed with diagnosis of small  bowel obstruction.  It was felt this was most likely due to adhesions.  She did have 2 results that were positive for fecal occult blood.  Her  hemoglobin and hematocrit remained stable, however, at 11.5 and 33.6 at  discharge (she was 13.4 and 39.1 on admission).  She remained n.p.o. for  the first few days.  She did have amylase levels checked, which was  normal at 113.  She had serial and abdominal films performed, which  showed improved daily and her last film, on July 15, showed significant  decrease in small bowel distention  and no clotting gas identified,  except that partial obstruction is expected.  There is no free air  noted.  She was allowed sips of clears on July 13 and slowly advanced to  a pureed diet and was tolerating this.  Once she was tolerating her  diet, she was resume on Coumadin per pharmacy dosing.  By discharge, Dr.  Ezzard Standing, general surgeon, said that she could advance her  diet as  tolerated.  During her admission, she did receive another smoking  cessation consult and again reported that she would quit smoking.  She  also had a nutrition consult that went over her described diet.  Physical therapy also saw her and recommended therapeutic exercises, but  no acute PT needs were felt needed.  At discharge, she was having bowel  movements and was without nausea or vomiting.  She would intermittently  have some short lived abdominal pain, but none were sustained.  She  received relief with Percocet and Xanax.  She maintained stable vital  signs and in sinus rhythm.   LABS AT DISCHARGE:  Showed an INR of 2.1, white blood count of 9.2,  hemoglobin 11.5, hematocrit 33.6, platelet count 370.  Sodium of 139,  potassium of 2.9, which was supplemented that was on July 11, a chloride  of 105, CO2 23, blood glucose 93, BUN of 5, creatinine 0.57, calcium  8.3.   DISPOSITION:  Ms. Dicicco was felt appropriate for discharge on March 05, 2007 as she was tolerating an oral diet and having no further nausea,  vomiting or persistent abdominal pain.  She was discharged home in  stable condition.   DISCHARGE MEDICATIONS:  1. Percocet 5/325 mg 1-2 tabs p.o. q.4 hours p.r.n. pain.  2. Xanax 0.25 mg p.o. t.i.d. p.r.n. anxiety.  3. Coumadin 7.5 mg p.o. q.p.m. and as directed by Dr. Nehemiah Settle.  4. Toprol-XL 50 mg p.o. daily.  5. Lexapro 10 mg p.o. q.h.s.  6. Altace 5 mg p.o. daily.  7. Singulair 10 mg p.o. daily.  8. Zyrtec 10 mg p.o. daily.  9. Synthroid 75 mcg p.o. daily.  10.TriCor 145 mg p.o. daily.  11.Flax  seed oil 1000 mg p.o. daily.  12.Hydrochlorothiazide 25 mg p.o. daily.  13.Vitamin D 400 units daily.  (Patient will resume TriCor and flax      seed oil and vitamin D supplement when she feels that her bowel      movements are more at her baseline).   DISCHARGE INSTRUCTIONS:  She may resume a heart-healthy diet as  tolerated.  She was advised to advance her diet slowly.  She may  increase her activity slowly.  She should avoid heavy lifting for the  next 2 weeks and avoid driving while on Percocet and Xanax.  She should  call if she develops fever greater than 101 or for persistent nausea,  vomiting or abdominal pain.   FOLLOWUP:  1. She is to follow up with Dr. Fabienne Bruns [facility] in 2 weeks.  Our office will contact her      regarding specific appointment, date and time.  2. She is to call [phone], schedule 6 week follow up with Dr. Ovidio Kin for followup for her small bowel obstruction and her heme      positive stools.  3. She is to have  PT/INR on March 10, 2007 at Dr. Idelle Crouch office and      is instructed to call for a specific appointment date and time.      She can discuss rescheduling of routine followup appointment at      that time.      Jerold Coombe, P.A.      Janetta Hora. Fields, MD  Electronically Signed    AWZ/MEDQ  D:  03/05/2007  T:  03/06/2007  Job:  [id number]   cc:  Deirdre Peer. Polite, M.D.  Sandria Bales. Ezzard Standing, M.D.

## 2011-01-02 NOTE — Op Note (Signed)
Danielle Barrett, CHIM NO.:  [id number]   MEDICAL RECORD NO.:  [id number]          PATIENT TYPE:  INP   LOCATION:  2101                         FACILITY:  [facility]   PHYSICIAN:  Danielle Barrett. Danielle Barrett, M.D. DATE OF BIRTH:  17-Oct-1960   DATE OF PROCEDURE:  DATE OF DISCHARGE:                               OPERATIVE REPORT   PREOPERATIVE DIAGNOSIS:  Small bowel obstruction.   POSTOPERATIVE DIAGNOSIS:  Small bowel obstruction.   PROCEDURE PERFORMED:  1. Exploratory laparotomy with extensive lysis of adhesions (greater      than 90 minutes).  2. Small-bowel resection x2.   SURGEON:  Danielle Barrett. Danielle Barrett, M.D.   ASSISTANT:  [name], M.D.   ANESTHESIA:  General endotracheal.   INDICATIONS:  The patient is a 50 year old female who is chronically ill  with a history of chronic mesenteric artery occlusive disease.  She has  undergone an aortoceliac and aorta-SMA bypass, April 2005.  In June  2008, she underwent thrombectomy of the SMA with graft revision..  The  patient has antithrombin III deficiency and has been on chronic  Coumadin.  She also has heparin-induced thrombocytopenia.  She has been  in and out of the hospital recently with partial small bowel  obstruction.  Her latest admission was July 21.  Over the last couple of  weeks, she has intermittently had bowel movements and has been able to  tolerate some liquids.  However, she has failed to progress.  Today she  appeared to be more distended.  Her abdomen was quite tender.  White  count was normal.  The decision was made to proceed with exploratory  laparotomy.  Her Refludan was held for 6 hours prior to proceeding to  the operating room.   DESCRIPTION OF PROCEDURE:  The patient was brought to the operating room  and placed in a supine position on operating table.  After an adequate  level of general anesthesia was obtained, the patient's abdomen was  prepped with Betadine and draped in sterile fashion.  A  time-out was  taken to ensure the proper patient and proper procedure.  We opened up  the midportion of her previous midline incision.  Dissection was carried  down to the fascia with cautery.  We removed some of the previously  placed PDS suture.  We were able to dissect into the peritoneal cavity.  The small bowel was densely adherent to the undersurface of the  abdominal wall, especially just to the left of the midline incision.  Most of the small bowel in the lower abdomen had some of flimsy  adhesions, but this area in the left side of the abdomen was twisted and  densely adherent.  There were several loops, which all were knotted  together.  The degree of inflammation in this area was not easily  explained.  The remainder of abdomen did not appear to have this degree  of inflammation.  We spent over 90 minutes trying to dissect this area  clear.  An enterotomy was made while taking down the small bowel  adhesions.  We  were able to mobilize this knot of small bowel up into  the wound.  We resected a short segment of small bowel.  This appeared  to be fairly proximal in the jejunum.  We were about 30 cm distal to the  ligament of Treitz.  Deep in the mesentery there was a soft mass which  was palpated; this did not appear to be attached to anything.  This was  bluntly dissected free and sent for pathologic examination.  This may  have represent an old walled-off hematoma.  We resected this short  segment of small bowel by firing a GIA stapler twice.  The mesentery was  taken down with the LigaSure device.  We then examined the remainder of  the small bowel, running it distally.  We broke up any adhesions that we  encountered.  We were able to run this all the way to the cecum.  Most  of the ileum appeared to be of normal caliber; however, there was a  clear transition point with a tight stricture in the proximal ileum.  We  attempted to do a stricturoplasty, but when we opened the  small bowel,  there was a thick band of scar tissue causing the stricture.  It was  obvious that a stricturoplasty would not opened this area up we had to  do another segmental resection.  We resected a 5-cm piece of small bowel  with GIA staplers.  We created a side-to-side anastomosis with another  firing of the GIA stapler and closed the enterotomy with a TA-60  stapler.  We then reexamined the small bowel from the cecum retrograde  to our previous resection line.  We then created another side-to-side  stapled anastomosis in the jejunum.  The GIA stapler was fired to create  the anastomosis.  We attempted to close the enterotomy with a TA-60  stapler.  However, when we tested the staple line, it leaked.  It did  not appear that the staples had fired.  We then closed this enterotomy  with 2-0 silk interrupted sutures.  We once again tested our anastomosis  and there was no leak.  Reinforcing sutures of 3-0 silk were placed at  the crotch of each anastomosis.  The mesenteric defects were closed with  figure-of-eight 2-0 silk sutures.  We then copiously irrigated the  peritoneal cavity with 4 L of warm saline.  The omentum was densely  adherent up into the upper abdomen, so there was no omentum to cover the  anastomosis.  A single piece of Seprafilm was placed over the small  bowel underneath the incision.  The fascia was closed with a double-  stranded #1 PDS suture.  The subcutaneous tissues were irrigated.  Skin  staples were used to close the skin.  The patient will be brought to the  intensive care unit intubated.  She was given 2 units of fresh frozen  plasma intraoperatively due to persistent oozing from all raw surfaces.  We will restart Refludan tomorrow.      Danielle Barrett. Tsuei, M.D.  Electronically Signed     MKT/MEDQ  D:  03/23/2007  T:  03/24/2007  Job:  [id number]   cc:   Janetta Hora. Darrick Penna, MD  Deirdre Peer. Polite, M.D.

## 2011-01-02 NOTE — H&P (Signed)
NAMENUVIA, HILEMAN NO.:  [id number]   MEDICAL RECORD NO.:  [id number]          PATIENT TYPE:  IPS   LOCATION:  0507                          FACILITY:  [facility]   PHYSICIAN:  Geoffery Lyons, M.D.      DATE OF BIRTH:  [birth year]   DATE OF ADMISSION:  05/27/2008  DATE OF DISCHARGE:                       PSYCHIATRIC ADMISSION ASSESSMENT   DATE OF THE ASSESSMENT:  May 27, 2008 at 1515 hours.   IDENTIFYING INFORMATION/JUSTIFICATION FOR ADMISSION AND CARE:  This is  50 year old separated Caucasian female.  This is a voluntary admission.   HISTORY OF THE PRESENT ILLNESS:  Chief complaint:  I just wish I didn't  have to be here anymore.Marland Kitchen   HISTORY OF THE PRESENT ILLNESS:  First inpatient psychiatric admission  for this 50 year old female who was referred by her primary care  practitioner for suicidal thoughts with a plan to kill herself by  possibly cutting her wrist.  She is on anticoagulation therapy and last  night made some scratches to her left wrist.  She reports she has had  increased suicidal thoughts for the last week or so and then yesterday  found out that her ex-husband is going to be dating her son's mother-in-  Social worker..  She feels that her husband is doing things that alienate her from  the family, since they have separated in May 2009.  She would like to  resume the marriage but he has declined.  She endorses depressed mood,  frequent tearfulness, increased fatigue, increased anhedonia for about a  month and for the past month notices that her alcohol use has increased  to 2-3 drinks daily when her previous baseline was to drink only rarely,  socially.  In addition, she was placed on Xanax 0.5 mg, to take one a  day, by her primary care practitioner several weeks ago and that is  increased to needing it twice a day and she feels she still cannot cope  with the anxiety.  She denies any homicidal thoughts.   PAST PSYCHIATRIC HISTORY:  No prior treatment.  She  reports 1 prior  episode, of being depressed and anxious, similar to this many years ago  when she cared for her father and handicapped brother at the same time  and at that time had excessive caregiver stress.  She has been followed  by her primary care practitioner who started her 1 year ago on Lexapro  10 mg for depression and she has been taking it consistently for the  past year with no changes in dose, Xanax, for anxiety, was added about a  month ago.   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  She was married 31 years and her husband left in  May 2009.  The patient has been the lifelong caregiver for her older  brother, Fredrik Cove, who is Down syndrome and mute.  She is currently living  at home with this brother and providing his care.  She has 2 sons and 3  grandchildren.  Her son Irene Limbo will look in on her  brother, Fredrik Cove, while  she is in the hospital and she has an older son with 2 children.  No  legal problems.   FAMILY HISTORY:  Remarkable mother with history of alcohol abuse.   ALCOHOL AND DRUG HISTORY:  Noted above.  She denies other substance use.   MEDICAL HISTORY:  Primary care practitioner Trula Slade at [facility].   MEDICAL PROBLEMS:  1. PVD.  2. Dyslipidemia.  3. Antithrombin deficiency.  4. Hypothyroidism.   CURRENT MEDICATIONS:  1. Lexapro 10 mg daily.  2. Xanax 0.5 mg, typically 1 in the morning and 1 at bedtime.  3. HCTZ 25 mg daily.  4. Synthroid 50 mcg every other day and last taken October 8th.  5. Synthroid 75 mcg every other day, last taken on the 7th.  6. Coumadin 7.5 mg on Wednesday, Saturday and Sunday and Coumadin 5 mg      Tuesday, Thursday and Friday.  7. Toprol 50 mg every evening.  8. Altace 25 mg every evening.  9. Zyrtec 10 mg daily.  10.Singulair 10 mg daily.  11.Lexapro 10 mg daily.  12.Tricor 145 mg daily.   DRUG ALLERGIES:  1. HEPARIN has caused HIT reaction in the  past.  2. She is also allergic to PENICILLIN.  3. CODEINE makes her jittery.   POSITIVE PHYSICAL FINDINGS:  Basic labs are pending.  Her physical exam  was done.  As noted in the record, this is a generally healthy  appearing, but anxious-appearing, Caucasian female in no distress.  She  is 5 feet 4-1/2 inches tall, 154 pounds, temperature 97, pulse 63,  respirations 18, blood pressure 158/75.  No significant findings on  physical exam.  MOTOR EXAM:  Is within normal limits.  NEURO EXAM:  Nonfocal.   BASIC LABS:  Are pending.   MENTAL STATUS EXAM:  A fully alert female, pleasant, cooperative with a  blunted affect.  She is tearful but cooperative, gives a coherent  history, good eye contact, fully engaged in conversation.  Mood is  depressed and sad.  Thought process logical, no evidence of psychosis,  no homicidal thoughts.  Clearly has suicidal thoughts.  Feels hopeless  about the future.  Sad about her marriage ending, does not feel that  there is much hope for reconciliation.  Cognitively, she is intact.  Insight is adequate.  Impulse control and judgment within normal limits.  Fund of knowledge good.   AXIS I:  Major depression recurrent, severe, rule out alcohol abuse.  AXIS II:  Deferred.  AXIS III:  (1)  Antithrombin deficiency.  (2)  Hypertension.  (3)  Hypothyroidism with history of Graves disease.  (4)  peripheral vascular  disease.  AXIS IV:  (1)  Severe, issues with marital discord and separation.  (2)  Caregiver stress.  AXIS V:  Current 46, past year 41.   PLAN:  To voluntarily admit her with q.15-minute checks in place, with  the goal of alleviating her suicidal thought.  We are going to continue  her routine medications.  Have already talked with her about the  relationship with alcohol  and benzodiazepines and will start her on Librium 25 mg q.6 h p.r.n. for  any withdrawal symptoms.  Will consider increasing her Lexapro but at  this point will let her settle  in tonight and talk with her further in  the morning.  So, we have provided trazodone 50 mg nightly p.r.n.  insomnia.      Margaret A. Scott, N.P.  Geoffery Lyons, M.D.  Electronically Signed    MAS/MEDQ  D:  05/27/2008  T:  05/27/2008  Job:  [id number]

## 2011-01-02 NOTE — Discharge Summary (Signed)
Danielle Barrett, Danielle Barrett NO.:  [id number]   MEDICAL RECORD NO.:  [id number]          PATIENT TYPE:  INP   LOCATION:  2011                         FACILITY:  [facility]   PHYSICIAN:  Janetta Hora. Fields, MD  DATE OF BIRTH:  11-15-1960   DATE OF ADMISSION:  01/20/2007  DATE OF DISCHARGE:                               DISCHARGE SUMMARY   ADMISSION DIAGNOSIS:  Recurrent mesenteric ischemia associated with  occlusion of the superior mesenteric artery graft and chronic occlusion  of the celiac bypass graft.   DISCHARGE AND SECONDARY DIAGNOSES:  1. Recurrent mesenteric ischemia associated with occlusion of the      superior mesenteric artery graft and chronic occlusion of the      celiac bypass graft status post thrombectomy and revision.  2. Newly-diagnosed antithrombin III deficiency requiring indefinite      anticoagulation therapy.  3. Thrombocytopenia with positive heparin-induced thrombocytopenia      testing (HIT testing).  4. Acute blood loss anemia requiring transfusion.  5. Oral thrush, improved  6. Leukocytosis, resolved.  7. Hypothyroidism.  8. History of Graves' disease.  9. Asthma.  10.Ongoing tobacco abuse.  11.Coronary artery disease.  12.Mesenteric artery occlusive disease.  13.History of anxiety.  14.Allergy to PENICILLIN, CODEINE, EGGS, and POULTRY.   CONSULTS:  1. General surgery.  2. Nutrition.  3. Smoking cessation.  4. Pharmacy for heparin and Refludan management.   PROCEDURES:  1. January 21, 2007:  Exploratory laparotomy with thrombectomy of the      aorta to superior mesenteric artery bypass, revision of aorta to      superior mesenteric artery bypass distal anastomosis using 8-mm      Dacron.  Surgeon, Dr. Fabienne Bruns.  2. January 22, 2007:  Exploratory laparotomy for closure of abdomen, Dr.      Fabienne Bruns.  3. January 28, 2007:  Insertion of right basilic PICC line.   BRIEF HISTORY:  Danielle Barrett is a 50 year old Caucasian female with  history of mesenteric artery occlusive disease.  She underwent aorto-  celiac and aorto-superior mesenteric artery bypass treated by Dr. Darrick Penna  in April 2005.  Subsequent CT scan performed in 2007 revealed occlusion  of the celiac bypass graft.  On January 20, 2007, she presented to [facility] with a complaint of a 52-hour history of central abdominal  pain associated with bilious vomiting and some loose stools.  She has  been anorexic.  CT scan performed after evaluation in the emergency  department revealed occlusion of celiac and SMA bypass grafts.  The  bowel wall was edematous.  There was some perfusion of the bowel with  opacification of the bowel wall with contrast.  The scans were reviewed  by on-call vascular surgeon Dr. Denman George; general surgeon, Dr.  Lindie Spruce; and radiologist, Dr. Kennith Center.  Decision was made that the  patient should be admitted under vascular surgery to undergo mesenteric  arteriogram the next morning for possible redo revascularization.  In  the meantime, she was sent to step-down unit 3300 for IV hydration and  IV  antibiotics with Primaxin.   HOSPITAL COURSE:  Danielle Barrett was admitted to [facility] on January 21, 2007, after being evaluated following the previous evening for  abdominal pain, was found to have recurrent mesenteric ischemia.  As  mentioned, she was started on IV fluids and IV antibiotics and underwent  an INR mesenteric arteriogram.  This showed the bypass graft to the  celiac and SMA both occluded.  The native origin to the celiac and SMA  were occluded.  There was severe stenosis at the origin of the IMA.  This vessel did reconstitute the SMA and the splenic artery.  On exam,  she had mild tenderness but no peritoneal signs.  Decision was made that  she would be taken to the operating room that day for re-exploration.  She ultimately required thrombectomy of aorta to SMA bypass graft and  revision of the distal  anastomosis.  She was transferred to the medical  intensive care unit postoperatively.  Of note, intraoperative findings  showed patchy segments of ischemic-appearing bowel but no gangrene.  General surgeon also examined her bowel intraoperatively and decision  was made to keep her sedated overnight with plans for her to return to  the operating room the following day to recheck her bowel.  This was  done on June 4 and there still appeared to a pale appearance to the  jejunum but no frank ischemia and no gangrene was noted.  She did have  some ascites.  Her abdominal wound was fully closed at that time.  General surgeon was in agreement with this decision.  She was returned  to the medical intensive care unit where she remained sedated and on the  ventilator.  She did have a followup CT angiogram which now showed a  patent graft with edematous bowel.  There was approximately 30%  narrowing in the proximal graft just after the takeoff.  Therefore, it  was felt that she would eventually need the graft revised, but due to  her critically-ill state it was felt it would be best to give her and  her bowel several days to recover.  Instead, she was started on  anticoagulation with IV heparin.  She is also started on TNA for  nutrition and her nasogastric tube remained.   Over the next several days she remained in the unit but was extubated  and eventually weaned from pressors.  She was transferred to telemetry  unit 2000 on January 24, 2007.  She continued on TNA and with her NG tube.  She also remained on Primaxin as she also was being followed for  leukocytosis.  Because of her occlusion of her graft she did undergo an  anticoagulation workup and was found to be negative for factor V Leiden  mutation but was found to have a low antithrombin III level at 61.  Based on this result it was felt that she would require long-term anticoagulation.  After several days of heparin she did have another   followup CT angiogram which now showed decrease in thrombus from the SMA  graft and decreasing small bowel edema.  She appeared to be making  progress, although still with greater than 1000 mL output from her  nasogastric tube.  However, on January 31, 2007, she was noted to have a  rather acute decrease in her hemoglobin from 7.3, down from 8.1.  This  had been trending down over several days.  She also complained of  diffuse abdominal pain and  was having some mild intermittent confusion  and complaints of seeing yellow.  Her IV heparin was subsequently  placed on hold and she was transferred to the surgical intensive care  unit.  It was felt that she was beginning to go into hypovolemic shock.  She was transfused with a total 4 units of packed red blood cells.  A CT  scan was ordered and general surgery was reconsulted.  Her CT showed  stable appearance from her the CT angiogram which had been performed the  day before.  There was no sign of pneumatosis.  There is a stable  mesenteric fluid collection which was thought most likely ischemic  hematoma.  By the time she was examined by general surgery she had  received 2 units of blood and was starting to feel better.  There were  no acute general surgery needs identified, although they continued to  follow her for the next few days.  Because of her acute decrease in  hemoglobin and a decrease in her platelet count from to 252 from 325,  she did undergo rapid HIT panel which was positive.  Subsequently, her  heparin was discontinued and she was started on Refludan for  anticoagulation.   By February 03, 2007, Danielle Barrett was again felt appropriate to transfer back  to telemetry unit 2000.  Her hemoglobin and hematocrit had remained  stable over several days.  She was having decreased NG tube output and  was beginning to have bowel movements.  She had no further diffuse  abdominal pain.  Her nasogastric tube was ultimately discontinued and  she had  no further nausea or vomiting.  She was started on a clear  liquid diet and over the course of several days was slowly advanced to a  regular diet.  Once she was tolerating a diet she was started on  Coumadin and kept on IV Refludan bridge.  Her TNA was discontinued once  she was on a regular diet.  Her medications were also resumed.  At this  point it was felt that she would be appropriate for discharge home once  her INR was therapeutic.  Today - February 10, 2007 - her Coumadin is nearly  therapeutic at 1.8.  She continues to tolerate a regular diet.  Her  abdomen is soft, nontender, with good bowel sounds.  She continues to  have some left upper quadrant tenderness with palpation but has no  guarding or rebound tenderness.  Her incisions are healing well without  signs of infection.  Her extremities remain well-perfused.  On telemetry  she is maintaining sinus rhythm and her lung sounds are clear.  Her oxygen saturations are above 90% on room air and vital signs have been  stable.  Most recently, temperature of 98.4 pulse is 61, respirations  18, blood pressure 129/62.  She had been treated with Diflucan for oral  thrush and this also appeared to be resolving.   Her most recent labs showed a prealbumin of 19.8, triglycerides 218,  sodium 137, potassium 4.0, chloride 104, CO2 26, blood glucose 92, BUN  3, creatinine 0.52, total bilirubin 0.6, alkaline phosphatase 79, AST  12, ALT 18, total protein 6.0, blood albumin 2.6, calcium 8.4.  White  blood count 12.6, hemoglobin 11.9, hematocrit 35.6, platelet count 529.  Her last phosphorus was normal at 4.3.  Her pain has been controlled on  oral medications.  She has been ambulating independently.   DISPOSITION:  If there are no significant changes  in Danielle Barrett status  it is anticipated that she will be ready for discharge home on June 24  or February 12, 2007, pending her INR is therapeutic at greater than or  equal to 2.0.   DISCHARGE  MEDICATIONS:  1. Percocet 5/325 mg one to two tablets p.o. q.4h. p.r.n. pain.  2. Coumadin.  Official dose to be determined date of discharge based      on INR results.  3. Toprol-XL 50 mg p.o. daily.  4. Xanax 0.5 mg p.o. daily p.r.n. anxiety.  5. Lexapro 10 mg p.o. q.h.s.  6. Altace 5 mg p.o. daily.  7. Singulair 10 mg p.o. daily.  8. Zyrtec 10 mg p.o. daily.  9. Synthroid 75 mcg p.o. daily.  10.TriCor 145 mg p.o. daily.  11.Flax seed oil 1000 mg p.o. daily.  12.Hydrochlorothiazide 25 mg p.o.  13.Vitamin D 400 units daily.   DISCHARGE INSTRUCTIONS:  She should follow a low-sodium heart-healthy  diet.  She should increase her activity slowly.  She may shower and  clean her incision gently with soap and water.  She should call if she  develops fever greater than 101, redness or drainage from her incision  site, or if she develops abdominal pain or persistent nausea or  vomiting.  She should avoid driving or heavy lifting for 3 weeks.   FOLLOWUP:  1. She is to follow up with Dr. Fabienne Bruns at the [facility] office on February 28, 2007, at 3:30 p.m.  2. She is to call and schedule routine followup with her primary      physician, Dr. Nehemiah Settle.  3. She is to have her PT/INR lab work done at Dr. Idelle Crouch office      within 1 week of discharge.  We anticipate most likely on February 14, 2007, but we will give her detailed instructions regarding this      prior to discharge.  I have spoken with Dr. Idelle Crouch secretary      regarding these arrangements.      Jerold Coombe, P.A.      Janetta Hora. Fields, MD  Electronically Signed    AWZ/MEDQ  D:  02/10/2007  T:  02/10/2007  Job:  [id number]   cc:   Deirdre Peer. Polite, M.D.  [facility]

## 2011-01-02 NOTE — Consult Note (Signed)
NAMENIREL, BABLER NO.:  [id number]   MEDICAL RECORD NO.:  [id number]          PATIENT TYPE:  EMS   LOCATION:  MAJO                         FACILITY:  [facility]   PHYSICIAN:  Danielle Barrett, M.D.    DATE OF BIRTH:  13-Jul-1961   DATE OF CONSULTATION:  DATE OF DISCHARGE:                                 CONSULTATION   GENERAL SURGERY CONSULTATION:  Dear Dr. Madilyn Barrett:   I was originally asked to see [name] by Dr. Joen Barrett from  the emergency room as an unfortunate 50 year old female who likely had  ischemic small bowel secondary to an occluded, previously placed aorta  mesenteric and celiac artery bypass in 2005.  She had a previous CT scan  done in 2007, which showed the celiac artery branch of this bypass was  occluded.  However, today the CT showed that the SMA branch of this  graft was occluded.  She is only 50 years old and a smoker.   Her problems actually began in 2005 in January when she was having vague  abdominal pain postprandially.  This was to the point, where she thought  to have acute acalculous cholecystitis and a laparoscopic  cholecystectomy was done.  Pathology confirmed the diagnosis of acute  acalculous cholecystitis; however, she continued to have pain and  ischemia and symptoms of mesenteric angina with postprandial abdominal  pain.  Eventually, she had a CT arteriogram, which showed pulses of  significant stenosis of the celiac and the superior mesenteric artery.  In April of 2005, she underwent bypass grafting by Dr. Fabienne Barrett  and she had done well until this past Sunday.  It started in the evening  of Sunday when she had been outside for awhile and  __________  and  thought that she had become dehydrated.  For these reasons, she did not  come to the emergency room right away; however, when it continued over  night through Monday morning, she came to the ED.  She had a CT scan,  which showed occlusion of the superior  mesenteric artery branch and also  fecal small bowel loops without frank evidence of infraction,  pneumatosis or perforation.  Vascular surgery and general surgery were  both consulted.   PAST MEDICAL HISTORY:  Significant for:  1. Peripheral vascular disease, as described previously.  2. History of Graves disease, treated with radioactive iodide      treatment, on replacement therapy currently.  3. Asthma.  4. History of pancreatitis, postop mesenteric bypass.  5. She has a history of a cardiac murmur, but no significant history      of coronary artery disease as listed in previous history and      physical.  She has never had a cardiac catheterization or      myocardial infarction or angina.   CURRENT MEDICATIONS:  Include, Zyrtec, flax seed oil, alprazolam,  hydrochlorothiazide, TriCor, levothyroxine, ramipril, Singulair and  Toprol-XL.  There may be others that were listed, as her medications are  not present when she came in.   ALLERGIES:  SHE HAS ALLERGY  TO:  1. CODEINE.  2. PENICILLIN.  3. EGGS.   SOCIAL HISTORY:  She is a 1 pack a day smoker.  She quit for about 2-1/2  years after her mesenteric bypass, but started up recently at a high  rate.   PAST SURGICAL HISTORY:  1. Cholecystectomy.  2. An aorta mesenteric bypass.  3. Iliac bypass.  4. Colonoscopy in January of 2005.  5. Esophagogastroduodenos at the same time.   REVIEW OF SYSTEMS:  She has had no blood in her stools, but she has had  diarrhea and bilious vomiting.  She has had some feelings of being hot  and chills.   PHYSICAL EXAMINATION:  VITAL SIGNS:  She is afebrile.  Blood pressure  189/94.  Pulse of 113.  Respirations of 18.  HEENT:  She is normocephalic, atraumatic.  She also has scleral icterus.  She does have a bilateral exophthalmos with the right being more  prominent than the left.  She has no carotid bruits.  NECK:  Supple.  LUNGS:  Clear to auscultation.  CARDIAC EXAM:  Grade 2/6 murmur at  the left lower sternal border without  radiation.  ABDOMEN:  Soft with markedly decreased bowel sounds.  She has no  peritonitis, but mild to moderate tenderness in the epigastrium with  deep palpation.  She also has a mild to moderate tenderness in the left  lower quadrant.  She has no peritonitis.  EXTREMITIES:  No pain.  Good pulses.  NEUROLOGIC EXAM:  Cranial nerves II-XII are grossly intact.   LABORATORY DATA:  She has a white count of 30.7, hemoglobin of 14.9,  hematocrit of 43.0, platelets of 400,000.  Electrolytes within normal  limits.  She has a slightly elevated liver function tests, AST and ALT.  Lipase is normal.  UA shows a specific gravity of 1.-31; otherwise,  negative.   IMPRESSION:  1. Patient has significant mesenteric ischemia and possible ischemic      bowels, but does not appear to be frankly infarct at this time.  2. She has nausea and vomiting secondary to number 1 with dehydration.  3. She has a vascular path, which likely coronary artery disease, but      this has not been proven.  4. She has got significant peripheral vascular disease and is a      smoker.  5. History of Graves disease.  6. Hypothyroidism.   PLAN:  Have both vascular surgery and general surgery combine in the  treatment of this patient.  Dr. Liliane Barrett was in to see the patient and  has currently recommended that she get a mesenteric arteriogram to help  define whether or not she has graftable vessels downstream from where  she now likely has an occluded graft.  I do believe that her bowel is  ischemic.  I cannot say that it is not infarct at all; however, I do not  think so based on her clinical findings.  If it is ischemic and we  explore her now, there is no way of deciding what bowel to keep or which  to resect.  Therefore, I think she thinks she needs to be given the best  chance of revascularization prior to proceeding with exploratory laparotomy.  This includes getting an  arteriogram and deciding whether  or not a revascularization is potentially possible.  If not, it could  lead to a very grave situation of short-gut syndrome in a very young  woman.   Even though she has  nausea and vomiting, she is throwing up bilious  fluid and not large amounts and I think this is thus reactive to her  occlusion.  I would not pass an NG tube at this time.  We both agree  upon giving the patient some additional antiemetics and also Primaxin  for the possibility of ischemic bowel.      Danielle Barrett, M.D.  Electronically Signed     JOW/MEDQ  D:  01/21/2007  T:  01/21/2007  Job:  [id number]   cc:   Balinda Quails, M.D.

## 2011-01-02 NOTE — H&P (Signed)
NAMEYANCY, HASCALL                  ACCOUNT NO.:  [id number]   MEDICAL RECORD NO.:  [id number]          PATIENT TYPE:  INP   LOCATION:  1823                         FACILITY:  [facility]   PHYSICIAN:  Balinda Quails, M.D.    DATE OF BIRTH:  05-25-1961   DATE OF ADMISSION:  01/20/2007  DATE OF DISCHARGE:                              HISTORY & PHYSICAL   CHIEF COMPLAINT:  Abdominal pain.   HISTORY.:  Danielle Barrett is a 50 year old female with history of mesenteric  artery occlusive disease.  She underwent aorto-celiac and aorto-superior  mesenteric artery bypass carried out by Dr. Darrick Penna in April 2005.  Subsequent CT scan performed in 2007 revealed occlusion of the celiac  bypass graft.   She presents at this time with an approximately 36-hour history of  central abdominal pain associated with bilious vomiting and some loose  stools.  She has been anorexic.   A CT scan performed after evaluation in the emergency department reveals  occlusion of the celiac and SMA bypass grafts.  There is bowel wall  edema.  There is some perfusion of the bowel with opacification of the  bowel wall with contrast.   PAST MEDICAL HISTORY.:  1. Hypothyroidism.  2. Grave's disease.  3. Asthma.  4. Coronary disease.  5. Mesenteric artery occlusive disease.  6. Anxiety.   MEDICATIONS:  1. Zyrtec 10 mg p.o. daily.  2. Flaxseed oil 1000 mg p.o. daily.  3. Xanax 0.5 mg p.o. p.r.n.  4. Lexapro 10 mg p.o. nightly.  5. Hydrochlorothiazide 25 mg p.o. daily.  6. Toprol XL 50 mg p.o. daily.  7. Levothyroxine 75 mcg p.o. daily.  8. Vitamin D 400 international units daily.  9. Ramipril 5 mg p.o. daily.  10.Omega-3 fish all 1000 mg p.o. daily.  11.Singulair 10 mg p.o. daily.  12.Tricor 140 5 mg p.o. daily   ALLERGIES:  PENICILLIN, CODEINE, EGGS and CHICKEN.   SOCIAL HISTORY:  The patient is married with 2 grown children.  She has  started smoking again, more than 1 pack per day.  Consumes 2-4 alcoholic  beverages weekly.   PHYSICAL EXAMINATION:  Moderately anxious 50 year old female.  She is  alert and oriented.  In mild distress.  VITAL SIGNS:  BP 178/91, heart rate is 113 per minute, temperature 97.2,  respiratory rate 18 per minute.  HEENT:  Reveals marked exophthalmos.  Extraocular movements intact.  Normocephalic.  Mouth and throat clear.  NECK:  Supple.  No thyromegaly or adenopathy.  CHEST:  Reveals equal entry bilaterally without rales or rhonchi.  CARDIOVASCULAR:  Normal heart sounds.  No murmurs.  No carotid bruits.  ABDOMEN:  Midline scar from some xiphoid umbilicus.  No hernia.  Mild to  moderate diffuse tenderness.  No acute peritoneal signs.  No rebound,  guarding.  Bowel sounds are active.  No masses, organomegaly.  No  abdominal bruits.  EXTREMITIES:  Reveal 2+ femoral, popliteal, posterior tibial, dorsalis  pedis pulses bilaterally.  No ankle edema.  NEUROLOGIC:  Alert and oriented.  Moves all extremities to command.  Cranial nerves intact.  LABORATORY:  CBC reveals hemoglobin 14.9, hematocrit 43.7, WBC 30.7, and  platelets 403,000.  B-MET:  Sodium 137, potassium 3.7, chloride 104, CO2 25, BUN 16, creatinine 0.8,  blood sugar 150.   IMPRESSION:  1. Recurrent mesenteric ischemia associated with occlusion of the      superior mesenteric artery graft and chronic occlusion of celiac      bypass graft.  2. Atherosclerotic vascular disease.  3. Hypothyroidism associated with Grave's disease.  4. Coronary artery disease.  5. Asthma   PLAN:  Discussed with Dr. Lindie Spruce who was also consulted on the patient.  CT scans have been reviewed with Dr. Lindie Spruce and Dr. Kennith Center.  The  patient will undergo a mesenteric arteriogram for possible redo  revascularization.  Continue IV hydration.  Start IV antibiotics with  Primaxin.      Balinda Quails, M.D.  Electronically Signed     PGH/MEDQ  D:  01/21/2007  T:  01/21/2007  Job:  [id number]

## 2011-01-02 NOTE — Procedures (Signed)
MESENTERIC ARTERIAL DUPLEX EVALUATION   INDICATION:  Follow-up aorta 2 SMA bypass grafts.   HISTORY:  Diabetes:  No  Cardiac:  No  Hypertension:  Yes  Smoking:  Yes   Mesenteric Duplex Findings:  Aorta - Proximal                            72  Aorta - Mid                                 83  Aorta - Distal                              87   Celiac Trunk - Proximal                     Occluded  Celiac Trunk - Distal                       Occluded   Hepatic Artery                              Not evaluated  Splenic Artery                              Not evaluated   Superior Mesenteric Artery-Origin           61  Superior Mesenteric Artery-Proximal         247  Superior Mesenteric Artery-Mid              169  Superior Mesenteric Artery-Distal           113   Inferior Mesenteric Artery-Proximal         527   IMPRESSION:  1. Greater than 75% stenosis noted at the proximal to mid inferior      mesenteric artery.  2. Patent superior mesenteric artery bypass with increased velocity at      the proximal portion and it may be due to vessel tortuosity.  3. Known celiac artery occlusion noted.      ___________________________________________  Janetta Hora Fields, MD   MG/MEDQ  D:  10/29/2008  T:  10/29/2008  Job:  [id number]

## 2011-01-02 NOTE — Op Note (Signed)
Danielle Barrett, Danielle Barrett NO.:  [id number]   MEDICAL RECORD NO.:  [id number]          PATIENT TYPE:  INP   LOCATION:  2115                         FACILITY:  [facility]   PHYSICIAN:  Janetta Hora. Fields, MD  DATE OF BIRTH:  10-10-1960   DATE OF PROCEDURE:  01/21/2007  DATE OF DISCHARGE:                               OPERATIVE REPORT   PROCEDURE:  1. Exploratory laparotomy.  2. Thrombectomy of the aorta to superior mesenteric artery bypass.  3. Revision of aorta to superior mesenteric artery bypass distal      anastomosis with 8 mm Dacron.   PREOPERATIVE DIAGNOSIS:  Thrombosed mesenteric bypass with gut ischemia,  acute.   POSTOPERATIVE DIAGNOSIS:  Thrombosed mesenteric bypass with gut  ischemia, acute.   ANESTHESIA:  General.   SURGEON:  Charles E. Fields, M.D.   ASSISTANT:  Jerold Coombe, P.A.-C.   INDICATIONS:  The patient is a 50 year old female who is three years  status post aorta to celiac and superior mesenteric artery bypass.  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  She  presented to the emergency room yesterday evening with increasing  abdominal pain, nausea and vomiting.  CT scan of the abdomen and pelvis  showed thrombosis of the SMA limb.  Subsequent arteriogram showed high  grade stenosis of the inferior mesenteric artery greater than 90% with  occlusion of the superior mesenteric and celiac arteries.  The bypass  graft was also occluded.   OPERATIVE FINDINGS:  1. Thrombosed mesenteric bypass with no obvious narrowing.  2. Patchy segments of ischemic appearing bowel, but no gangrene.   OPERATIVE DETAIL:  After obtaining informed consent, the patient was  taken to the operating room.  The patient was placed in the supine  position on the operating table.  After induction of general anesthesia  and endotracheal intubation, the patient's abdomen was prepped and  draped in the usual sterile fashion.  A midline laparotomy  incision was  made through the pre-existing scar.  The incision was carried down  through the subcutaneous tissues down to the level of the fascia.  The  fascia was incised.  There was some abdominal wall adhesions that were  probably omental in character.  There was one area of small bowel which  was adherent to the fascia.  This was taken down with cautery and the  fascia left attached to the bowel.  After the laparotomy incision had  been opened for its full length, the small bowel was inspected from the  ligament of Treitz down to the ileocecal valve.  There was one segment  of bowel approximately 3 cm in length at the mid jejunum that was fairly  dusky in appearance but not frankly gangrenous.  There were other  several patchy areas of duskiness along the distal jejunum but no  gangrene anywhere within the small bowel.  The colon was pale in  appearance but viable.  There was no area of ischemia noted.  The left  lateral segment of the liver was slightly pale in appearance.  Next, dissection was carried down to the level of the root of the  mesentery.  The previous retroperitoneal closure had been done with a  Prolene suture.  This was found and reopened.  The mesenteric bypass was  dissected free circumferentially.  There was no flow within it.  Dissection was carried down to the level of the distal anastomosis to  the superior mesenteric artery.  The SMA was dissected free  circumferentially above and below the area of stenosis.  The artery was  obviously full of clot under direct visualization.  Several small  branches of the SMA were dissected free circumferentially and controlled  with vessel loops.  At this point, the patient was given 7000 units of  intravenous heparin.  A transverse graftotomy was made just above the  level of distal anastomosis.  Number 3, 4, and 5 Fogarty catheters were  used to thrombectomize the mesenteric limb of the graft.  A large amount  of thrombus  was obtained.  An arterialized plug was then obtained and  there was excellent arterial inflow at this point.  The catheter was  passed until two clean passes were obtained.  Next, this was clamped  proximally with an angled coarctate clamp.  The 3 and 4 Fogarty  catheters were then passed proximally up the native SMA.  A moderate  amount of clot was removed.  This was a blind sac at the end of the  proximal SMA.  There was a small amount of forward bleeding, but this  was trivial.  The catheters were then passed down the distal SMA.  A  large amount of thrombus was removed.  The catheter was also passed off  several side branches of the SMA.  All thrombotic material was removed.  The catheters were passed multiple times until two clean passes were  obtained.  There was good arterial back bleeding at this point.  The  distal SFA was then controlled with a fine bulldog clamp.  The distal  anastomosis was completely taken down in order to visualize this portion  of the SMA and make sure there was no narrowing.  There was no obvious  intimal hyperplasia or narrowing of the distal anastomosis.  Next, a new  8 mm Dacron graft was brought up on the operative field.  This was  spatulated and sewn end of graft to side of superior mesenteric artery  using a running 6-0 Prolene suture.  At completion of the  anastomosis,  this was then cut to length and attached to the old graft end-to-end  using a running 5-0 Prolene suture.  Just prior to completion of the  anastomosis, everything was forward bled, back bled, and thoroughly  flushed.  The anastomosis was secured, the clamps were released. There  was a small side branch of the SMA that was repaired with two 6-0  Prolene sutures.  Doppler was then used to check the SMA and there was  good flow in the distal SMA and out into the arterial arcade of the  mesentery.  Next, hemostasis was obtained.  The retroperitoneum was closed with a  running 3-0  Prolene suture.  After hemostasis was adequate, the small  bowel was again inspected from the ligament of Treitz all the way to the  ileocecal valve.  The small bowel pinked up considerably, although there  still were several patchy areas of questionable ischemia.  There was no  frank gangrene.  It was decided, at this point, to bring  the patient  back for a second look laparotomy the following day.  I also called Dr.  Karie Soda in to inspect the bowel and he also agreed that nothing  needed resection at this time.  The abdomen was thoroughly irrigated  with normal saline solution and the skin closed with staples.  The  fascia was left open as a repeat laparotomy was planned for the  following day.  The patient was taken to the intensive care unit in  stable condition.  Instrument, sponge and needle counts were correct at  the end of the case.  The patient tolerated the procedure well and there  were no complications.      Janetta Hora. Fields, MD  Electronically Signed     CEF/MEDQ  D:  01/22/2007  T:  01/22/2007  Job:  [id number]

## 2011-01-02 NOTE — Assessment & Plan Note (Signed)
OFFICE VISIT   Danielle Barrett, Danielle Barrett  DOB:  [birth year]                                       05/04/2010  EAVWU#:[id number]   The patient returns for followup today.  She was last seen in September  of 2009.  She previously underwent an aorto to celiac and SMA bypass  graft in April of 2005.  She has a chronic occlusion of the celiac limb  of her graft but her SMA graft has been patent without any problems.  She did undergo a thrombectomy of the SMA portion of the bypass in June  of 2008.  She has not required any further procedures since then  regarding her mesenteric vascular tree.  She did undergo a lysis of  adhesions by general surgery in 2008 after her latest revision.  She is  on chronic Coumadin therapy.  This is primarily for a history of  antithrombin III deficiency.   Since her last office visit with me she states that her appetite has  been good.  Her current weight is 160 pounds.  She is neither gaining  nor losing weight.  She denies any postprandial abdominal pain.  Unfortunately she continues to smoke one pack of cigarettes per day.   She was recently found to have an endometrial cancer and is under  evaluation by Dr. Clifton James in [place] for possible surgical  intervention for this.   On review of systems she denies any shortness of breath or chest pain  today.   PAST MEDICAL HISTORY:  Coronary artery angioplasty in July of 2011,  hypertension, elevated cholesterol.   SOCIAL HISTORY:  She works as a Nutritional therapist.  She is married.  She has two children.  Smoking history is as listed above.   FAMILY HISTORY:  Remarkable for coronary artery disease at a young age.   PHYSICAL EXAM:  Vital signs:  Blood pressure is 134/80 in the left arm,  temperature is 97.8, heart rate 64 and regular.  HEENT:  Unremarkable.  Neck:  Has no carotid bruits.  Chest:  Clear to auscultation.  Cardiac:  Exam is regular rate and rhythm.  Abdomen:  Soft,  nontender,  nondistended.  No hernia.  She has no abdominal bruits.  She has 2+  radial, 2+ femoral and 1+ posterior tibial pulses in her lower  extremity.  Musculoskeletal:  She has no significant lower extremity  edema.  She has no musculoskeletal deformities.  Neurologic:  Exam shows  symmetric upper extremity and lower extremity motor strength.  Skin:  Has no ulcers or rashes.   She had a mesenteric duplex scan today which I reviewed and interpreted.  This shows chronic occlusion of the celiac limb of her bypass graft but  a widely patent SMA graft with no elevated velocities.  Overall the  patient is doing well.  We again discussed smoking cessation.  She will  return for followup and a repeat mesenteric graft duplex in one year's  time.  I discussed with her today that if she requires an operation in  the future she would need to come off of her Coumadin and would need  heparin bridging around the time of that operation.     Janetta Hora. Fields, MD  Electronically Signed   CEF/MEDQ  D:  05/04/2010  T:  05/05/2010  Job:  [id number]   cc:  Deirdre Peer. Polite, M.D.  Dr Clifton James

## 2011-01-02 NOTE — H&P (Signed)
NAMEJOVAN, Danielle Barrett                  ACCOUNT NO.:  [id number]   MEDICAL RECORD NO.:  [id number]          PATIENT TYPE:  EMS   LOCATION:  MAJO                         FACILITY:  [facility]   PHYSICIAN:  Danielle Barrett, M.D.  DATE OF BIRTH:  November 06, 1960   DATE OF ADMISSION:  03/10/2007  DATE OF DISCHARGE:                              HISTORY & PHYSICAL   REASON FOR CONSULTATION:  Partial bowel obstruction.   HISTORY OF PRESENT ILLNESS:  Danielle Barrett is a 50 year old white female who  was seeing Danielle Barrett through [facility] (she has left) and now  is seeing Danielle Barrett as her primary care doctor.  She has a history  of a ischemic bowel requiring an aorta superior mesenteric artery bypass  in April of 2005 by Danielle Barrett.  She did well, until she  represented with an occlusion or thrombosis of her mesenteric bypass and  required a revision by Danielle Barrett on the 3rd of June 2008.  He even did  a 24-hours re-exploration on her abdomen, to make sure there was no  evidence of ischemic bowel.   She had done well, was discharged; however, she had increasing  difficulty with post prandial pain and bloating.  She required re-  admission to [facility] on the 10th of July by Danielle Barrett  service.  We were consulted to follow her in the hospital where she  stayed until the 16th of July.  It appeared on her CT scan during that  admission she had a partial bowel obstruction.  However, by the date of  discharge, she was eating, having bowel movements, and appeared to be  doing well.   She continued to have bowel movements after hospitalization.  She had no  nausea or vomiting.  She looks like she was doing well enough to have  gone home last Wednesday on the 16th.  She did well at home until early  today, when she had increasing bloating, nausea, vomiting, but she still  even had a bowel movement this morning.   Her only other significant surgery is she had a prior cholecystectomy  by  Danielle Barrett in February of 2005, and I think she has had 2 C-  sections.   Of note, Danielle Barrett also did an upper endoscopy on her the 23rd of  February 2005.   PAST MEDICAL HISTORY:  SHE IS ALLERGIC TO CODEINE, WHICH MAKES HER  JITTERY.  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   CURRENT MEDICATIONS:  Include:  1. Zyrtec 10 mg nightly.  2. Alprazolam daily.  3. Lexapro 10 mg nightly.  4. Hydrochlorothiazide.  5. Tricor, though she has not been taking this recently.  6. Synthroid.  7. Ramipril.  8. Singulair.  9. Coumadin 16.5 mg daily.   REVIEW OF SYSTEMS:  NEUROLOGIC:  She has had no history of seizure or  loss of consciousness.  CARDIAC:  She has hypertension, which she has had for about 3 years.  No  known heart attack or cardiac catheterization.  PULMONARY:  She has  adult-onset asthma.  She has had about 5 years followed by Danielle Barrett.  GASTROINTESTINAL:  See history of present illness.  UROLOGIC:  no history of kidney infections.  ENDOCRINE:  She has a history of hypothyroidism of about 5 years.  She  also has a history of hypertriglyceridemia, though she is unsure kind of  where the status is of that right now.   She is accompanied by her husband in the emergency room.   PHYSICAL EXAMINATION:  VITAL SIGNS:  Her temperature is 98.1, blood  pressure 127/70, pulse is 74, respirations 20.  GENERAL:  She is well-nourished light-haired white female, alert and  cooperative with physical exam.  HEENT:  Unremarkable.  NECK:  Supple.  I felt no mass or thyromegaly.  LUNGS:  Clear to auscultation with symmetric breath sounds.  HEART:  Regular rate and rhythm.  I do not hear a murmur.  ABDOMEN:  Mildly distended.  She has maybe some very mild tenderness or  soreness in her left upper quadrant but no guarding, no rebound.  Her  abdominal incision is well healed.  I feel no hernia, no organomegaly.  EXTREMITIES:   She has good strength in upper and lower extremities.  NEUROLOGIC:  Grossly intact.   LABORATORY:  The labs I have show a white blood count of 14,400,  hemoglobin 15, hematocrit 42.  Platelet count is 456,000.  Her PT is  23.5 and an INR or 2.0.  Her sodium is 133, potassium 3.9, chloride of  96, CO2 of 21, glucose of 129.  Her lipase is 23.  Her creatinine is  0.7.   I reviewed her CT scan with Danielle Barrett.  This appears to show a very  similar appearance to her last films.  First, this was not arteriogram  phase, but she did have oral contrast which does show some dilated small  bowel, ends kind of up in her right lower quadrant, but she really did  not have any symptoms in this area.  She has a couple of extra luminal  fluid collections which seem stable from 2 weeks ago.   DIAGNOSES:  1. Partial bowel obstruction versus ischemic bowel.  Her clinical      picture is more like a partial bowel obstruction.  Danielle Barrett has been consulted.  I think she is being admitted by the  [facility] hospitalist, since she has several medical problems.  Her plans  for IV fluids, NPO, hold her Coumadin, repeat her KUB in the morning,  but if her symptoms persist, I think that  abdominal exploration  is a  possiblity.  Ther is a chance that she has underlying ischemic bowel as  a cause of her symptoms.  I discussed all of these options/possiblities  with her and husband.  1. Antitrypsin 3 deficiency.  2. History of on Coumadin anticoagulate.  3. Asthma followed by Danielle Barrett.  4. Hypothyroidism, followed by Danielle Barrett.  5. Hypertension.  6. Hypertriglyceridemia.      Danielle Barrett, M.D.  Electronically Signed     DHN/MEDQ  D:  03/10/2007  T:  03/10/2007  Job:  [id number]   cc:   Janetta Hora. Darrick Penna, MD  Deirdre Peer. Polite, M.D.  Kerry Kass, M.D. [facility]  Danielle Barrett, M.D.  James L. Malon Kindle., M.D.

## 2011-01-02 NOTE — Discharge Summary (Signed)
Danielle Barrett, SHARPLES NO.:  [id number]   MEDICAL RECORD NO.:  [id number]          PATIENT TYPE:  INP   LOCATION:  2011                         FACILITY:  [facility]   PHYSICIAN:  Janetta Hora. Fields, MD  DATE OF BIRTH:  [birth year]   DATE OF ADMISSION:  01/20/2007  DATE OF DISCHARGE:  02/12/2007                               DISCHARGE SUMMARY   ADDENDUM   This is an addendum to a previously dictated discharge summary; see Job  No. [id number] regarding specific details of patient's admission and  discharge diagnoses, consults, procedures, history and hospital course  through February 10, 2007.   HOSPITAL COURSE FROM JUNE 23 TO February 12, 2007:  As previously mentioned  in her initial discharge summary, [name] was felt appropriate for  discharge, but we were waiting for her INR to become therapeutic.  Her  INR was finally above 2 on February 12, 2007, specifically at 2.1.  Subsequently, her Refludan was discontinued as well as her PICC line and  she was felt appropriate for discharge home on February 12, 2007 in stable  condition.   UPDATED MEDICATIONS:  Medications are as previously dictated with the  exception that she was instructed to take Coumadin 7.5 mg p.o. q.p.m.  and as directed by Dr. Nehemiah Settle.   DISCHARGE INSTRUCTIONS:  Discharge instructions are as previously  dictated.  She was given specific details regarding getting her PT/INR  checked at Dr. Idelle Crouch office on Thursday, February 13, 2007, between 9:00  a.m. and 4:00 p.m.  Otherwise, there are no changes from her previously  dictated discharge summary.      Jerold Coombe, P.A.      Janetta Hora. Fields, MD  Electronically Signed    AWZ/MEDQ  D:  04/04/2007  T:  04/04/2007  Job:  [id number]

## 2011-01-02 NOTE — Procedures (Signed)
MESENTERIC ARTERIAL DUPLEX EVALUATION   INDICATION:  Follow up aorta to superior mesenteric and celiac artery  bypass graft, which was placed in 2005 with a known occlusion of the  celiac limb.   HISTORY:  Diabetes:  No.  Cardiac:  No.  Hypertension:  Yes.  Smoking:  Yes.   Mesenteric Duplex Findings:  Aorta - Proximal                            71  Aorta - Mid                                 97  Aorta - Distal                              83   Celiac Trunk - Proximal                     Occluded  Celiac Trunk - Distal                       Occluded   Hepatic Artery                              138 (collateral)  Splenic Artery                              Not evaluated   Superior Mesenteric Artery-Origin           40  Superior Mesenteric Artery-Proximal         119  Superior Mesenteric Artery-Mid              102  Superior Mesenteric Artery-Distal           123   Inferior Mesenteric Artery-Proximal         Not visualized     IMPRESSION:  1. Patent mesenteric bypass graft with a sharp kink noted at the      proximal segment.  Unable to replicate increased velocities noted      in this region during previous examinations.  2. Known occlusion of the celiac limb of the bypass graft.  3. There is a collateral vessel feeding into the liver, which      originates from the mid superior mesenteric artery with a velocity      of 245 cm/s noted at its origin.  4. Unable to adequately visualize the inferior mesenteric artery,      where a previously noted elevated velocity was seen, due to      overlying bowel gas patterns.       ___________________________________________  Janetta Hora Fields, MD   CH/MEDQ  D:  11/02/2009  T:  11/02/2009  Job:  [id number]

## 2011-01-02 NOTE — Assessment & Plan Note (Signed)
OFFICE VISIT   Danielle Barrett, Danielle Barrett  DOB:  July 18, 1961                                       05/07/2007  EAVWU#:[id number]   HISTORY:  This patient recently underwent thrombectomy and revision of  her SMA bypass on June 3.  This was complicated postoperatively by small  bowel obstruction and she eventually required exploratory laparotomy and  bowel resection by Dr. [name] on August 3.  She has a history of  antithrombin 3 deficiency which was noted on the last hospitalization  for her graft thrombosis.  She also has a history of heparin induced  thrombocytopenia.  She currently is on Coumadin 7.5 mg once a day Monday  through Friday and 5 mg Saturday and Sunday.  Her weight currently is  135 pounds.  This represents a 20 pound weight loss from one year ago.  However, she states she has gained a few pounds back recently.  She does  not have any post prandial abdominal pain.  She says her appetite is  improving.   PHYSICAL EXAMINATION:  Blood pressure is 108/63, heart rate 75 and  regular.  ABDOMEN:  Wounds are granulating with some healing.  She has  continued to have wound care on these.   She had a CT angiogram which showed no evidence of bowel obstruction.  Her superior mesenteric artery bypass graft is widely patent with good  contrast opacification of the celiac by collaterals from the SMA.   Overall the patient seems to be doing well.  Hopefully she will continue  to recover with time.  She will have a mesenteric duplex in our office  in six months time to continue to do surveillance of her bypass graft.  She will also continue her Coumadin for life.   Janetta Hora. Fields, MD  Electronically Signed   CEF/MEDQ  D:  05/07/2007  T:  05/08/2007  Job:  [id number]   cc:   Wilmon Arms. Tsuei, M.D.  Ike Bene, M.D.  James L. Malon Kindle., M.D.

## 2011-01-02 NOTE — Procedures (Signed)
MESENTERIC ARTERIAL DUPLEX EVALUATION   INDICATION:  Followup aorto to celiac/SMA bypass graft in 2005, known  occlusion of the celiac limb of the bypass graft.   HISTORY:  Diabetes:  No.  Cardiac:  No.  Hypertension:  Yes.  Smoking:  Yes.   Mesenteric Duplex Findings:  Aorta - Proximal                            55  Aorta - Mid                                 65  Aorta - Distal                              89   Celiac Trunk - Proximal                     Occluded  Celiac Trunk - Distal                       Occluded   Hepatic Artery                              Not visualized  Splenic Artery                              Not visualized   Superior Mesenteric Artery-Origin           66  Superior Mesenteric Artery-Proximal         124  Superior Mesenteric Artery-Mid              94  Superior Mesenteric Artery-Distal           102   Inferior Mesenteric Artery-Proximal         Not visualized   IMPRESSION:  1. Patent aorta to mesenteric bypass graft with no increase in the      velocities noted.  2. Known occlusion of the celiac limb of the bypass graft.  3. The proximal common hepatic, splenic and inferior mesenteric      arteries were not adequately visualized.  4. No significant change noted when compared to the previous exam on      11/02/2009.       ___________________________________________  Janetta Hora. Fields, MD   CH/MEDQ  D:  05/04/2010  T:  05/04/2010  Job:  [id number]

## 2011-01-02 NOTE — Consult Note (Signed)
NAMEJAHNI, Danielle Barrett NO.:  [id number]   MEDICAL RECORD NO.:  [id number]          PATIENT TYPE:  INP   LOCATION:  2011                         FACILITY:  [facility]   PHYSICIAN:  Angelia Mould. Derrell Lolling, M.D.DATE OF BIRTH:  20-Aug-1961   DATE OF CONSULTATION:  02/28/2007  DATE OF DISCHARGE:                                 CONSULTATION   CONSULTING SURGEON:  Angelia Mould. Derrell Lolling, M.D.   VASCULAR SURGEON:  Janetta Hora. Fields, MD   REASON FOR CONSULTATION:  Small-bowel obstruction.   HISTORY OF PRESENT ILLNESS:  [name] is a 50 year old female patient  with history of aortic superior mesenteric artery bypass graft in 2005,  most recently admitted in June of this year because of occlusion of same  graft. She underwent thrombectomy as well as revision of the distal  superior mesenteric artery to aorta graft.  On initial look during that  procedure, no evidence of ischemia in the bowel and several days later  repeat quick look was done, and this again demonstrated no evidence of  bowel ischemia.  The patient recovered from that procedure and was  discharged home.  Over the past 4 days, the patient has noticed  difficulty with postprandial pain and bloating.  This has progressed to  nausea and vomiting.  The pain would be quite severe, most especially  within 30 minutes after eating.  She would hear gurgling noises and had  some reflux-type symptoms.  She is now complaining of liquid stool  without flatus.  She subsequently presented to the ER where she was  admitted by vascular service.  Her plain x-rays were consistent with a  small-bowel obstruction, and CT angiography demonstrated no ischemia and  patent mesenteric grafts. She has stable fluid collections in the  mesentery.  Her initial white count was somewhat elevated at 11,000 but  has now decreased to 6500 after bowel rest and IV fluid hydration.  There were several attempts to insert an NG tube since admission, and  this  has been unsuccessful.   REVIEW OF SYSTEMS:  As above.  The patient currently denies any nausea.  She feels full but has no actual pain. When she would have pain, it  would be cramp like at times, but again this is markedly improved as  compared to prior to admission.   PAST MEDICAL HISTORY:  1. History of Graves disease. Now she is hypothyroid. She apparently      has either undergone radioactive iodine and/or ablation treatment.  2. Asthma.  3. CAD.  4. Mesenteric artery occlusive disease.  5. Anxiety.  6. 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   PAST SURGICAL HISTORY:  1. C-section x2.  2. Laparoscopic cholecystectomy in 2002 by Dr. Abbey Chatters for      acalculous cholecystitis.  3. Aorta to celiac and superior mesenteric artery bypass grafting in      2005 by Dr. Darrick Penna..  4. Exploratory laparotomy with thrombectomy of the aorta superior      mesenteric artery graft with revision of the distal anastomosis of      same graft in 2008.  ALLERGIES:  PENICILLIN, CODEINE.  She is also sensitive or allergic to  EGGS and CHICKEN.   CURRENT MEDICATIONS:  Include IV fluids, morphine for pain, Zofran for  symptoms, potassium for low potassium.  She also Hirudin ordered when INR less than 2.   PHYSICAL EXAMINATION:  GENERAL:  Pleasant female patient sitting up in  the chair and occasionally ambulating, complaining of recent recurrence  of abdominal pain that has improved since admission to the hospital.  VITAL SIGNS:  Temperature 98.5, blood pressure 117/57, pulse 78 and  regular, respirations 20.  NEUROLOGIC: The patient is alert, oriented x3, moving all extremities  x4.  No focal deficits.  She ambulates without difficulty.  HEENT:  Head normocephalic. Sclera not injected.  NECK:  Supple.  No adenopathy.  CHEST:  Bilateral lung sounds clear to auscultation. Respiratory effort  is not labored.  She is on room air.  CARDIAC:  S1-S2, no rubs, murmurs, or gallops.  Pulses regular.   ABDOMEN:  Slightly distended and soft, mildly tender in the upper  quadrant without guarding or rebound. No bowel sounds are auscultated.  EXTREMITIES:  Symmetrical in appearance without edema, cyanosis or  clubbing.  Pulses were not checked.   LABORATORY DATA:  Sodium 139, potassium  2.9, CO2 23,  Glucose 93,  BUN  5, creatinine 0.57.  PT 30.2, INR 2.7.  White count 6500, hemoglobin  10.9 after hydration, platelets 377,000. Urinalysis is negative.   Diagnostic CT of the abdomen and pelvis as noted.   IMPRESSION:  1. Small-bowel obstruction.  2. Known mesenteric artery occlusive disease status post recent      thrombectomy and revision of distal superior mesenteric artery      graft.  3. Hypothyroidism.  4. Hypokalemia.  5. Leukocytosis, resolved.   PLAN:  1. Would continue bowel rest. NG tube only if the patient begins to      develop emesis.  Since she has had difficulty with previous stent      insertion, NG tube may need to be inserted in radiology if      indicated.  2. Agree with repletion of potassium as will help with increasing      intestinal motility.  3. Continue IV fluid hydration.  4. Clinical exam, laboratory workup, and diagnostics are not      consistent with ischemia as etiology to the bowel obstructions.      Expect this is probably due to adhesions.  It is important to note      that on her clinical exam for ischemia on prior hospitalization.      there was a band-like area of some mild tissue discoloration but      was not consistent enough or concerning enough to be felt more      ischemic. This area could have become more fibrotic,      and this could be the etiology of her bowel obstruction at this      time.  5. I will follow serial two-view abdominal x-rays and give the patient      at least 72 hours to open up without surgical intervention. At that      time, will need to reevaluate and determine if surgical      intervention is necessary.       [name], N.P.      Angelia Mould. Derrell Lolling, M.D.  Electronically Signed    ALE/MEDQ  D:  02/28/2007  T:  03/01/2007  Job:  [id number]  cc:   Janetta Hora. Darrick Penna, MD

## 2011-01-02 NOTE — Assessment & Plan Note (Signed)
OFFICE VISIT   Danielle Barrett, Danielle Barrett  DOB:  [birth year]                                       05/05/2008  ZOXWR#:[id number]   The patient is a 50 year old female who previously underwent aorto to  superior mesenteric and celiac bypass in April of 2005.  She  subsequently underwent thrombectomy of the superior mesenteric limb in  June of 2008.  The celiac limb has been chronically occluded.  Of note,  she does have a replaced right hepatic artery anatomically.   Since the last time she was seen she has been doing well.  Unfortunately  she continues to smoke one pack of cigarettes per day.  She has not had  any recent weight loss.  She denies any postprandial pain.  She says she  has some occasional aches and pains in her abdomen and some occasional  nausea but overall has done well.  She has had no symptoms consistent  with the mesenteric type ischemic symptoms she had previously.   SOCIAL HISTORY:  She is married and has two children.  Smoking history  is as above.   REVIEW OF SYSTEMS:  She does have some mild depression and anxiety.  She  also has a history of hypercoagulable state.  ORTHOPEDICS:  She has some  muscle pains.  NEUROLOGIC:  She has occasional headaches.  PULMONARY:  She has asthma.  CARDIAC:  She is a history of a heart murmur.   PHYSICAL EXAMINATION:  Vital signs:  On physical exam today blood  pressure is 155/75, heart rate 66 and regular.  Weight today was 157.8  pounds.  HEENT:  Is unremarkable.  She has 2+ carotid pulses.  She has  no bruits.  Chest:  Clear to auscultation.  Heart:  Is regular rate and  rhythm without murmur.  Abdomen:  Soft, nontender, nondistended with no  bruits.  She has normal active bowel sounds.  She has a well-healed  midline scar with no evidence of hernia.  She has 2+ femoral pulses  bilaterally.   Overall the patient looks well.  I again counseled her today on smoking  cessation.  She will follow up with me in  1 year's time for a mesenteric  duplex.  She will continue to take her Coumadin for life.   Janetta Hora. Fields, MD  Electronically Signed   CEF/MEDQ  D:  05/05/2008  T:  05/06/2008  Job:  [id number]   cc:   Deirdre Peer. Polite, M.D.

## 2011-01-05 NOTE — H&P (Signed)
NAME:  Danielle Barrett, Danielle Barrett                            ACCOUNT NO.:  [id number]   MEDICAL RECORD NO.:  [id number]                   PATIENT TYPE:  INP   LOCATION:  1825                                 FACILITY:  [facility]   PHYSICIAN:  James L. Malon Kindle., M.D.          DATE OF BIRTH:  [birth year]   DATE OF ADMISSION:  09/20/2003  DATE OF DISCHARGE:                                HISTORY & PHYSICAL   REASON FOR ADMISSION:  Persistent abdominal pain.   HISTORY OF PRESENT ILLNESS:  A 50 year old white female who has had several  month of epigastric abdominal pain.  This has been worse after eating.  She  has also had one or two occasions of rectal bleeding.  We saw her with these  symptoms.  At that time, she had a gallbladder ultrasound, a stimulated  hepatobiliary scan with ejection fraction of 48%, and a CT scan of her  abdomen which had been done in August for ovarian cyst, showing fluid around  the ovary which was subsequently followed up by her gynecologist.  I saw her  first with these symptoms exactly one month ago, and she did have positive  stools and rectal bleeding that was probably hemorrhoids, but she had had  some weight loss and abdominal pain.  We went ahead with an endoscopy and  colonoscopy.  The colonoscopy revealed a small polyp in the cecum and rectum  that were hyperplastic.  Upper endoscopy revealed multiple small shallow  gastric ulcers that had the appearance of ulcers from nonsteroidal drugs,  although the patient has adamantly denied taking any nonsteroidal drugs.  We  have increased her PPI to b.i.d. and added Carafate.  However, this really  has not helped.  She came in to the hospital over the weekend after for the  past month having good days and bad days, but persistent epigastric pain  after eating.  Labs have always revealed a slight elevation of her white  count, but with normal liver tests and amylase.  I have seen the patient  back in the office.  Again, she  has adamantly denied pain.  We have her set  up for tomorrow, an MRCP.  She came in to the ER, had worsening pain in the  right upper quadrant, going through to the back, worse after eating.  Her  bowel movements have been okay.  She has had normal bowel movements, no  visible blood over the weekend.  She went home with some Vicodin and came  back still having severe pain.  She was up all night, nausea, with pain and  unable to eat or take any medicines.  She came back to the ER.  At this  point, her white count is 16 with normal differential.  She has not had any  vomiting but has been nauseated.  She has had no fever, chills or burning  with urination.  Due to  her continued pain that has not responded as would  be expected, it was felt that she needed to be admitted.   CURRENT MEDICATIONS:  1. Singulair.  2. Zyrtec.  3. Synthroid.  4. Protonix.  5. Carafate.   ALLERGIES:  CODEINE.  Chicken and feathers.   PAST MEDICAL HISTORY:  1. She has a history of heart murmur, coronary disease.  2. She has asthma.  She is seen by Dr. Graceann Congress for this in the past.     It comes and goes.  3. She has also had Graves disease that was treated in the past by Dr.     Margaretmary Bayley.   PREVIOUS SURGERY:  Two cesarean section.   FAMILY HISTORY:  Mother had hypertension and died of a heart attack.  Father  died of heart failure.  There is ulcers, gout, mental retardation, asthma,  breast cancer as well as diabetes in her family.  She has two children who  are healthy.  There is no family history of colon cancer.   SOCIAL HISTORY:  She is married.  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  She does not drink.  She does smoke and has for  about 25 years.   REVIEW OF SYSTEMS:  Remarkable for a seven pound weight loss, with  persistent abdominal pain.  Her periods have been unremarkable.  Apparently,  her ovaries have been okay when they were checked out by Dr.  _________  recently.   PHYSICAL EXAMINATION:  VITAL SIGNS:  Temperature 95, blood pressure 126/84,  pulse 100, respirations 22.  GENERAL:  An alert, white female, in no acute distress.  HEENT:  Eyes:  Sclerae nonicteric.  Extraocular movements intact.  Mouth:  Mucosal membranes are dry.  NECK:  Supple, no lymphadenopathy.  Thyroid is normal.  LUNGS:  Clear.  HEART:  Regular rate and rhythm, no murmurs or gallops.  ABDOMEN:  Nondistended.  Soft, positive bowel sounds with marked tenderness  in the right upper quadrant.  Positive Murphy's sign with deep inspiration.  EXTREMITIES:  Without edema.   LABORATORY DATA:  Hemoglobin is normal.  Lipase normal.  Liver function  tests __________ Urinalysis ___________.  White count 16.7 with 77% segs.   ASSESSMENT:  1. Right upper quadrant pain with increased white count, could be     gallbladder disease in spite of the negative ultrasound.  Hepatobiliary     scan.  Ulcers could cause the pain, but they should have been cleared up     at this point.  Agree that we need to start thinking about other causes.  2. History of Graves disease.  3. History of asthma.  4. History of gastric ulcer.   PLAN:  1. Admit.  2. We will get culture of blood and a CT of the abdomen and pelvis with     contrast __________.                                                James L. Malon Kindle., M.D.    Waldron Session  D:  09/20/2003  T:  09/20/2003  Job:  [id number]   cc:   Gabriel Earing, M.D.  [address]  [place]  [place] [address]  Fax: [phone]   Margaretmary Bayley, M.D.  [address]  [place]  [place]  [address]  Fax: [phone]   Adolph Pollack, M.D.  [address], [address]  [place]  [place] [address]  Fax: [phone]

## 2011-01-05 NOTE — Op Note (Signed)
NAME:  Danielle Barrett, Danielle Barrett                            ACCOUNT NO.:  [id number]   MEDICAL RECORD NO.:  [id number]                   PATIENT TYPE:  AMB   LOCATION:  ENDO                                 FACILITY:  [facility]   PHYSICIAN:  James L. Malon Kindle., M.D.          DATE OF BIRTH:  [birth year]   DATE OF PROCEDURE:  08/23/2003  DATE OF DISCHARGE:                                 OPERATIVE REPORT   PROCEDURE PERFORMED:  Colonoscopy and polypectomy.   ENDOSCOPIST:  Llana Aliment. Randa Evens, M.D.   MEDICATIONS:  The patient received a total of fentanyl 150 mcg and Versed  10 mg IV for both procedures.   INSTRUMENT USED:  Pediatric Olympus colonoscope.   INDICATIONS FOR PROCEDURE:  Positive stool.   DESCRIPTION OF PROCEDURE:  The procedure had been explained to the patient  and consent obtained.  With the patient in the left lateral decubitus  position, the pediatric adjustable Olympus pediatric adjustable colonoscope  was inserted and advanced under direct visualization.  Prep was excellent  and we were able to reach the cecum using abdominal pressure and position  changes.  The ileocecal valve and appendiceal orifice were seen.  Very near  the appendiceal orifice was a small polyp 3 to 5 mm.  This was removed with  a snare and sucked through the scope.  It was fairly small and only tiny  fragments were found.  They were placed in jar #1.  No polyps were seen in  the ascending colon, transverse colon, descending and sigmoid colon. No  significant diverticular disease.  In the rectum, a 0.5 cm sessile polyp was  encountered and removed with a snare and sucked through the scope.  This was  placed in jar #2.  The patient tolerated the procedure well.   ASSESSMENT:  Polyps removed from the cecum and rectum, 211.3.   PLAN:  Routine post polypectomy instructions.  Will see back in the office  in six weeks.                                               James L. Malon Kindle., M.D.    Waldron Session  D:   08/23/2003  T:  08/23/2003  Job:  [id number]   cc:   Gabriel Earing, M.D.  [address]  [place]  [place] [address]  Fax: [phone]   Margaretmary Bayley, M.D.  [address]  [place]  [place] [address]  Fax: [phone]

## 2011-01-05 NOTE — Op Note (Signed)
NAME:  Danielle Barrett, Danielle Barrett                            ACCOUNT NO.:  [id number]   MEDICAL RECORD NO.:  [id number]                   PATIENT TYPE:  AMB   LOCATION:  ENDO                                 FACILITY:  [facility]   PHYSICIAN:  James L. Malon Kindle., M.D.          DATE OF BIRTH:  May 13, 1961   DATE OF PROCEDURE:  08/23/2003  DATE OF DISCHARGE:                                 OPERATIVE REPORT   PROCEDURE:  Esophagogastroduodenoscopy and biopsy.   MEDICATIONS:  Fentanyl 50 mcg, Versed 5 mg IV.   INDICATIONS FOR PROCEDURE:  Heme positive stool and abdominal pain.   DESCRIPTION OF PROCEDURE:  The procedure had been explained to the patient  and consent obtained.  With the patient in the left lateral decubitus  position, the Olympus scope was inserted and advanced.  The stomach was  entered.  The pyloris identified and passed.  The duodenum including the  bulb and second portion were seen well and was unremarkable.  The scope was  withdrawn back into the stomach.  There were multiple small gastric ulcers  in the antrum which were not actively bleeding.  A biopsy was taken near  this for Helicobacter.  They were all shallow.  The body of the stomach,  fundus, and cardia were seen well and were normal.  The distal and proximal  esophagus was endoscopically normal.  The scope was withdrawn.  The patient  tolerated the procedure well.   ASSESSMENT:  Multiple gastric ulcers probably due to nonsteroidal drugs.  I  suspect this is the cause of her positive stool and abdominal pain.  I will  increase her Protonix to b.i.d.  Check the results of the test for  Helicobacter.  Make absolutely certain she takes no nonsteroidal drugs.  Will see her back in the office in six weeks and proceed with colonoscopy at  that time.                                               James L. Malon Kindle., M.D.    Waldron Session  D:  08/23/2003  T:  08/23/2003  Job:  [id number]   cc:   Gabriel Earing, M.D.  [address]  [place]  [place] [address]  Fax: [phone]   Margaretmary Bayley, M.D.  [address]  [place]  [place] [address]  Fax: [phone]

## 2011-01-05 NOTE — Op Note (Signed)
NAME:  Danielle Barrett, Danielle Barrett                            ACCOUNT NO.:  [id number]   MEDICAL RECORD NO.:  [id number]                   PATIENT TYPE:  AMB   LOCATION:  ENDO                                 FACILITY:  [facility]   PHYSICIAN:  James L. Malon Kindle., M.D.          DATE OF BIRTH:  1960/08/31   DATE OF PROCEDURE:  10/13/2003  DATE OF DISCHARGE:                                 OPERATIVE REPORT   PROCEDURE:  Esophagogastroduodenoscopy with biopsy.   MEDICATIONS:  Fentanyl 75 mcg, Versed 8 mg IV.   INDICATIONS:  The patient has had a history of Graves disease treated by Dr.  Margaretmary Bayley.  She had EGD which showed small polyps and several shallow  ulcers.  Tests for Helicobacter were negative.  These were felt to be  nonsteroidal.  She has continued to have nausea and vomiting with  progressively worse abdominal pain.  She presented to the ER.  She had a  previous ultrasound and a stimulated hepatobiliary scan by Dr. _________  that was negative.  CT scan showed a massively dilated gallbladder  consistent with acute cholecystitis.  She underwent a laparoscopic  cholecystectomy by Dr. Avel Peace with finding of a distended  gallbladder but no stones.  Her abdominal pain is gone completely but she is  still having nausea and vomiting when she eats and has lost weight, down  approximately six pounds.  Due to the fact that she did have shallow ulcers  on endoscopy, she is continuing to have nausea and vomiting after eating.  An endoscopy is performed to rule out recurrent progressive ulcers or to  document healing.   DESCRIPTION OF PROCEDURE:  After the procedure had been explained to the  patient, consent obtained.  The patient was in the left lateral decubitus  position.  The Olympus scope was inserted and advanced.  The stomach was  entered, pylorus identified and passed.  Duodenal bulb was completely normal  as we went down into the second duodenum.  The folds were seen to be  edematous with whitish material and scalloping of the folds all through the  second duodenum and multiple biopsies were taken.  I did not recall seeing  this on the previous endoscopy, but at that time she had marked inflammation  of the duodenal bulb and the stomach due to her ulcers.  Again the duodenal  bulb was normal.  The pyloric channel was normal.  The antrum and body of  the stomach were completely normal and there are no ulcers there to biopsy.  The fundus and cardia in the retroflexed view were normal.  The scope was  withdrawn.  The distal and proximal esophagus were endoscopically normal.   ASSESSMENT:  1. Gastric ulcers healed.  531.30.  2. Abnormal duodenal folds with scalloping, consider Giardia, celiac,     Whipple's disease, etc.   PLAN:  Will decrease Protonix to once daily.  Will  obtain an outpatient  gastric emptying scan and see back in the office in two weeks.                                               James L. Malon Kindle., M.D.    Waldron Session  D:  10/13/2003  T:  10/13/2003  Job:  [id number]   cc:   Gabriel Earing, M.D.  [address]  [place]  [place] [address]  Fax: [phone]   Adolph Pollack, M.D.  [address]  [place]  [place] [address]  Fax: [phone]

## 2011-01-05 NOTE — Op Note (Signed)
NAME:  Danielle Barrett, Danielle Barrett                            ACCOUNT NO.:  [id number]   MEDICAL RECORD NO.:  [id number]                   PATIENT TYPE:  INP   LOCATION:  1478                                 FACILITY:  [facility]   PHYSICIAN:  Adolph Pollack, M.D.            DATE OF BIRTH:  03/16/1961   DATE OF PROCEDURE:  09/21/2003  DATE OF DISCHARGE:                                 OPERATIVE REPORT   PREOPERATIVE DIAGNOSIS:  Acute acalculous cholecystitis.   POSTOPERATIVE DIAGNOSIS:  Acute acalculous cholecystitis.   PROCEDURE:  Laparoscopic cholecystectomy with intraoperative cholangiogram.   SURGEON:  Adolph Pollack, M.D.   ASSISTANT:  Sandria Bales. Ezzard Standing, M.D.   ANESTHESIA:  General.   INDICATIONS FOR PROCEDURE:  This is a 50 year old female who has been having  problems with epigastric and right upper quadrant pain for a number of  months. Her symptoms have escalated and she was admitted last night by Dr.  Randa Evens.  CT scan demonstrated gallbladder wall thickening and  pericholecystic edema and fluid consistent with acute cholecystitis.  She is  now brought to the operating room.  The procedure and the risks were  discussed with her preoperatively.   DESCRIPTION OF PROCEDURE:  She was seen in the holding area and then brought  to the operating room, placed supine on the operating table and a general  anesthetic was administered.  The abdominal wall was sterilely prepped and  draped.  Local anesthetic was infiltrated in the subumbilical region and a  small supraumbilical incision was made through the skin, subcutaneous  tissue, fascia and peritoneum.  A pursestring suture of 0 Vicryl was placed  around the fascial edges. A Hasson trocar was introduced into the peritoneal  cavity and a pneumoperitoneum was created by insufflation of C02 gas.  Next,  the laparoscope was introduced and she was placed in reverse Trendelenburg  position with the right side tilted slightly up.  An 11 mm  trocar was placed  through an epigastric incision and an inflamed gallbladder was visualized  with edema and some inflammatory adhesions to the omentum.  Two 5 mm trocars  were then placed in the right mid abdomen and the fundus of the gallbladder  was able to be grasped.  Using blunt dissection the omentum was swept free  from the gallbladder and the fundus of the gallbladder was retracted towards  the right shoulder. The infundibulum was then grasped and mobilized with the  cautery and blunt dissection.  An anterior branch of the cystic artery was  isolated and seen to be running anterior to the cystic duct.  It was clipped  and divided.  The cystic duct was then isolated and a window created around  it.  A clip was placed above the cystic duct/gallbladder junction. I then  made an incision at the cystic duct/gallbladder junction. A  cholangiocatheter was passed through the anterior abdominal wall, placed  into the cystic duct and a cholangiogram was performed.   Under real time fluoroscopy, blue contrast material was injected into the  cystic duct.  The cystic duct was of moderate length.  The common hepatic,  right and left hepatic and common bile ducts all filled promptly and  contrast drained promptly from the common bile duct into the duodenum  without obvious evidence of obstruction.  Final report is pending the  radiologist's interpretation.   The cholangiocatheter was then removed.  The cystic duct was then clipped  three times staying inside and divided.  A posterior branch of the cystic  artery was clipped and divided.  The gallbladder was dissected free from the  liver bed intact with electrocautery and placed in an Endopouch bag.   The gallbladder fossa was irrigated with a liter of saline solution and  bleeding points controlled with the cautery.  Once hemostasis was adequate,  the gallbladder was removed through the supraumbilical port and the  supraumbilical fascial  defect was closed by tightening up and tying down the  pursestring suture and adding a simple 0 Vicryl suture.  The perihepatic  area was again evaluated and the gallbladder fossa evaluated. No bleeding or  bile leak was noted. As much of the irrigation fluid as possible was  evacuated.   Following this all trocars were removed and the pneumoperitoneum was  released.  The skin incisions were closed with 4-0 Monocryl subcuticular  stitches.  Steri-Strips and sterile dressings were applied.   She tolerated the procedure well without any apparent complications and was  taken to the recovery room in satisfactory condition.                                               Adolph Pollack, M.D.    Kari Baars  D:  09/21/2003  T:  09/21/2003  Job:  [id number]   cc:   Fayrene Fearing L. Malon Kindle., M.D.  [address], [address]  [place]  [place] [address]  Fax: [phone]   Gabriel Earing, M.D.  [address]  [place]  [place] [address]  Fax: [phone]

## 2011-01-05 NOTE — Consult Note (Signed)
NAME:  Danielle Barrett, Danielle Barrett                            ACCOUNT NO.:  [id number]   MEDICAL RECORD NO.:  [id number]                   PATIENT TYPE:  INP   LOCATION:  1825                                 FACILITY:  [facility]   PHYSICIAN:  James L. Randa Evens, M.D.              DATE OF BIRTH:  06-May-1961   DATE OF CONSULTATION:  09/20/2003  DATE OF DISCHARGE:                                   CONSULTATION   REASON FOR CONSULTATION:  Epigastric and right upper quadrant abdominal  pain.   HISTORY OF PRESENT ILLNESS:  Danielle Barrett is a 50 year old female who has had  epigastric pain after eating for a number of months.  It appears to be more  in the right upper quadrant, now radiating some to the back and associated  with nausea.  She has had a fairly extensive workup by Dr. Fayrene Fearing L. Edwards  including an ultrasound of the abdomen which has demonstrated normal  gallstone. A nuclear medicine and hepatobiliary scan with CCK injection  demonstrated normal ejection fraction.  She had an upper endoscopy which  demonstrated some shower gastric ulcerations and colonoscopy was relatively  unremarkable.  She has been treated with proton pump inhibitor and Carafate.  She continues to have pain.  She denies use of aspirin-like products.   Over the weekend, her pain has become worse and Dr. Fayrene Fearing L. Randa Evens has  seen her and admitted her.  She denies any jaundice or hepatitis.  Apparently, in the past, she has had a CT scan which was unremarkable as  well.   PAST MEDICAL HISTORY:  1. Grave's disease.  2. Asthma.  3. Hypothyroidism.   ALLERGIES:  She states CODEINE causes a rapid heart rate in her.   PAST SURGICAL HISTORY:  1. Cesarean section x 2.  2. Bilateral tubal ligation.   SOCIAL HISTORY:  She is married and employed.  She has a 25-year history of  smoking.  Occasionally has an alcoholic beverage.   FAMILY HISTORY:  Positive for heart disease and hypertension.  There is  breast cancer and diabetes  present as well.   REVIEW OF SYMPTOMS:  CARDIOVASCULAR:  She denies any known heart attack.  PULMONARY:  She denies any emphysema, tuberculosis, or pneumonia.  GASTROINTESTINAL:  She denies any hepatitis or diverticulitis.  GENITOURINARY:  No kidney stones.  ENDOCRINE:  She denies  hypercholesterolemia or diabetes mellitus.  NEUROLOGIC:  No strokes or  seizures.  HEMATOLOGIC:  No bleeding, sores, or deep vein thrombosis.   PHYSICAL EXAMINATION:  GENERAL:  A well-developed, well-nourished female who  is in no acute distress.  Pleasant and cooperative.  VITAL SIGNS:  She is afebrile.  Blood pressure is 156/59, pulse is 68.  SKIN:  Warm, dry, and no jaundice.  HEENT:  Mild exophthalmos is present.  Extraocular movements intact.  No  icterus is noted.  NECK:  Supple without palpable or obvious thyroid  enlargement.  LUNGS:  Breath sounds are equal and clear.  Respirations are unlabored.  CARDIOVASCULAR:  Demonstrates a regular rate and rhythm.  No murmur is  heard.  ABDOMEN:  Soft with mild to moderate right upper quadrant tenderness to  palpation.  No palpable masses or organomegaly.  A naval ring is present.  EXTREMITIES:  Good muscle tone.  No cyanosis or edema.   LABORATORY DATA:  A white cell count has been chronically elevated in the  12,000 and 13,000 range as checked by Dr. Fayrene Fearing L. Edwards and currently  today it is 16,000 which is up from 13,900 on September 15, 2003.  Her liver  function tests from September 10, 2003 are within normal limits as was her  amylase and lipase.   I have reviewed all of her laboratory data from Dr. Fayrene Fearing L. Edwards' office  as well as her ultrasound report and hepatobiliary report from [facility].  I have also reviewed a CT of her abdomen and pelvis from August  2004 which demonstrated no significant abnormality except some small cyst on  the right ovary.   IMPRESSION:  Upper gastric and right upper quadrant pain radiating to the  back  with nausea, particularly after eating.  Symptoms certainly suggest  potential for gallbladder disease, although current diagnostic studies have  been negative for gallstones or functional gallbladder problem.  Other  differentials certainly could include some kind of mild diverticulitis or  persistent gastric ulcerations.   PLAN:  I agree with getting another CT scan and the plan for upper endoscopy  in the morning.  Further recommendations are pending these results.  I did  tell the patient if all of these results were unrevealing and  I discussed  with her the possibility that some people with this type of situation indeed  have gallbladder disease despite normal test and have proceeded with a  laparoscopic cholecystectomy in the past.  Some people improve and some  people do not.  I did go over the procedure and the risks with her.  The  risk include but are not limited to bleeding, infection, common bile duct  injury, hepatic injury, bile leak, small intestinal injury, risks of general  anesthesia, and possible postcholecystectomy diarrhea.  She seems to  understand all this.     Adolph Pollack, M.D.                  James L. Randa Evens, M.D.    Kari Baars  D:  09/20/2003  T:  09/20/2003  Job:  [id number]   cc:   Fayrene Fearing L. Malon Kindle., M.D.  [address], [address]  [place]  [place] [address]  Fax: [phone]

## 2011-01-05 NOTE — Op Note (Signed)
NAME:  Danielle Barrett, Danielle Barrett                            ACCOUNT NO.:  [id number]   MEDICAL RECORD NO.:  [id number]                   PATIENT TYPE:  INP   LOCATION:  2314                                 FACILITY:  [facility]   PHYSICIAN:  Janetta Hora. Fields, MD               DATE OF BIRTH:  05-24-1961   DATE OF PROCEDURE:  11/22/2003  DATE OF DISCHARGE:                                 OPERATIVE REPORT   POSTOPERATIVE DIAGNOSIS:  Chronic mesenteric ischemia with acute change in  abdominal exam.   POSTOPERATIVE DIAGNOSIS:  Chronic mesenteric ischemia.   PROCEDURE:  Aorto- to celiac and superior mesenteric artery bypass.   ANESTHESIA:  General.   ASSISTANT:  Carolyn A. Duffy Rhody Hart Rochester, M.D., Di Kindle.  Edilia Bo, M.D.   INDICATIONS:  The patient is a 50 year old female with a lengthy history of  smoking.  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  The patient had increase in abdominal pain over the last 24 hours.   OPERATIVE FINDINGS:  A 12 x 6 mm Dacron bifurcated graft to celiac trunk and  superior mesenteric artery.   OPERATIVE DETAILS:  After obtaining informed consent, the patient was taken  to the operating room.  The patient was placed in the supine position on the  operating room table.  After induction of general anesthesia and  endotracheal intubation, a nasogastric tube and a Foley catheter were  placed.  Next the patient's entire abdomen was prepped and draped in the  usual sterile fashion.  A midline laparotomy incision was then made  extending from the xiphoid down to just below the umbilicus.  The incision  was carried down through the subcutaneous tissues, down through the fascia  and through the peritoneum.  Exploration of the abdomen showed a large left  lobe of the liver.  Otherwise, the viscera were normal in appearance and no  evidence of any other gross  abnormality.   Next the Omni retractor was brought up in the operative field to assist in  mobilizing the supraceliac aorta.  The supraceliac aorta was dissected free  by dividing the left crus of the diaphragm.  Dissection then proceeded down  to the level of the celiac trunk.  The celiac trunk was dissected free  circumferentially and controlled with a vessel loop.  All branches of the  celiac artery were then dissected free circumferentially and controlled with  vessel loops.  Next attention was turned toward isolation of the superior  mesenteric artery.  The colon and greater omentum were reflected superiorly.  The duodenum was reflected slightly to the left.  The superior mesenteric  artery was then isolated near its origin and dissected free proximally and  distally and controlled with vessel loops.  Next a retropancreatic tunnel  was made and an  umbilical tape passed through this tunnel.  To assist in  exposing the supraceliac aorta, the entire left lateral segment of the liver  was taken down as well as dividing the falciform ligament.  The esophagus  was also retracted to the patient's left and out of harm's way.  Next the  patient was given 5000 units of intravenous heparin and 25 g of mannitol.  The supraceliac aorta was then clamped proximally and a clamp was also  applied just above the celiac origin.  A longitudinal arteriotomy was made.  A 12 x 6 mm Dacron graft was brought up in the operative field and sewn end-  to-side to the aorta using a running 3-0 Prolene suture.  Prior to  completion of the anastomosis, it was tested and two small repair stitches  with pledgets were placed.  Supraceliac clamp time was approximately 15-20  minutes.  The clamp was then moved down just below the bifurcation of the  graft.  Vessel loops were pulled taut on the celiac artery.  The graft was  cut to length.  A longitudinal arteriotomy was made in the celiac artery  near the confluence of  the branches.  The graft was then cut to length and  spatulated.  The graft was then sewn end of graft to side of artery using a  running 6-0 Prolene suture.  Prior to completion of the anastomosis the  usual flushing procedures were performed.  After completion of the  anastomosis, it was checked for hemostasis and one repair stitch was placed.  Next attention was turned toward the superior mesenteric artery.  The other  limb of the graft was pulled through the retropancreatic tunnel down to the  level of the superior mesenteric artery dissection.  The graft was then cut  to length and one limb in this portion of the graft was clamped proximally.  The superior mesenteric artery was clamped at its origin and several side  branches controlled with vessel loops.  A longitudinal arteriotomy was made.  There was noted to be good backbleeding from all the branches.  There was  very slow and sluggish forward bleeding from the native superior mesenteric  trunk.  Next the graft was cut to length and spatulated and sewn end of  graft to side of artery using a running 6-0 Prolene suture.  Prior to  completion of the anastomosis the usual flushing procedures were performed.  The anastomosis was found to be hemostatic.  Both anastomoses were covered  in thrombin and Gelfoam, and the patient was given 50 mg of protamine.  Doppler inspection of the celiac trunk, its branches, and the SMA trunk and  its branches all had biphasic Doppler signals at the end of the case.  After  hemostasis was obtained, the fascia was closed with a running #1 PDS suture.  The bowel on closing the abdomen was pink in character.  Also the left  lateral segment of the liver was pink in character.  The spleen had no  evidence of laceration.  After the fascia was closed with PDS suture, the  abdomen wound was thoroughly irrigated with normal saline solution.  The  skin was then closed with staples.  The patient tolerated the  procedure well, and there were no complications.  Instrument, sponge, and needle count  was correct at the end of the case.  The patient was taken to the recovery  room in stable condition.  Janetta Hora. Fields, MD    CEF/MEDQ  D:  11/22/2003  T:  11/23/2003  Job:  [id number]

## 2011-01-05 NOTE — Discharge Summary (Signed)
NAME:  Danielle Barrett, Danielle Barrett                            ACCOUNT NO.:  [id number]   MEDICAL RECORD NO.:  [id number]                   PATIENT TYPE:  INP   LOCATION:  5033                                 FACILITY:  [facility]   PHYSICIAN:  Janetta Hora. Fields, MD               DATE OF BIRTH:  12-25-1960   DATE OF ADMISSION:  11/22/2003  DATE OF DISCHARGE:  12/11/2003                                 DISCHARGE SUMMARY   ADMISSION DIAGNOSES:  Chronic mesenteric ischemia with acute change  abdominal examination.   PAST MEDICAL HISTORY:  1. Hypothyroidism.  2. Hypertension.  3. History of coronary artery disease.  4. Asthma.   ALLERGIES:  EGGS, CHICKEN, and CODEINE.   DISCHARGE DIAGNOSES:  1. Chronic mesenteric ischemia with acute change in abdominal exam status     post aortic-to-celiac-and-superior-mesenteric-artery bypass.  2. Postoperative pancreatitis.   BRIEF HISTORY:  This is a 50 year old female with known three-vessel  mesenteric ischemia.  The patient presented to the emergency room on November 22, 2003, with worsening abdominal pain and diarrhea.  Dr .Fabienne Bruns, of  [facility], was consulted to evaluate the patient.  The patient complained of some  vomiting at that time.  On exam, the abdomen was soft and diffusely tender  to palpitations without guarding.  The patient was ill appearing with a  blood pressure of 178/92, temperature 98.1, and heart rate of 114.  After  examination of the patient, and due tot he patient's known chronic  mesenteric ischemia, it was Dr. Darrick Penna' opinion the patient should undergo  emergency exploration and mesenteric bypass.   HOSPITAL COURSE:  The patient was admitted through the emergency department  by Dr. Fabienne Bruns and subsequently taken to the OR on November 22, 2003, for  aorta-to-celiac-and-superior-mesenteric-artery bypass with a 6 mm Hemashield  graft.  The patient tolerated the procedure well and was hemodynamically  stable immediately  postoperatively.  The patient was transferred from the OR  to the postanesthesia care unit and then to the SICU in stable condition.  The patient was extubated without complication and woke up from anesthesia  neurologically intact.  The patient was transfused with fresh frozen plasma  postoperatively.   On postop day #1, the patient was complaining of abdominal spasm and was,  therefore, started on a PCA pump.  The patient had 2+ posterior tibial  pulses, and the abdomen was soft.   It was noted on postoperative day #2 that the patient had elevated LFTs and  amylase.  The patient's AST was 991, ALT 774, alkaline phosphatase 83,  amylase 225, lipase 36.  The patient's INR was 2.3 at that time.  The  patient's pain was improved, and physical exam was unremarkable.  The  patient was started on TNA and kept n.p.o.  The patient was transferred to  unit 2000 without complications.   The patient's bowel function was slow to return,  and she was kept n.p.o.  with NG tube in place.  On postoperative day #4, chest x-ray revealed  bilateral pleural effusions, and the patient was subsequently scheduled for  a left thoracentesis.  The patient was ambulating well and was stable  overall.  The patient underwent a left ultrasound-guided thoracentesis by  interventional radiology on November 26, 2003.  Approximately 500 ml of  serosanguineous fluid was drained, and The patient tolerated the procedure  well.   On postoperative day #7, the patient's bowel function was noted to be slowly  returning with flatus and small bowel movement.  The abdomen was soft and  nontender.  It was nondistended, and the incisions were clean and intact.  The patient's amylase remained elevated at 364, alkaline phosphatase 183,  SGOT 36, and SGPT 128.  The patient's amylase remained elevated; however,  she was clinically asymptomatic for pancreatitis.  Her NG tube was  discontinued on postop day #7, and the patient was kept  strictly n.p.o.  The  patient's TNA was continued.   On postoperative day #8, the patient remained stable overall clinically;  however, she continued to have a rise in amylase.  On postoperative day #8,  amylase level was 450.  Therefore, Dr. Darrick Penna ordered a CT angiogram of the  abdomen and pelvis.  The patient was kept n.p.o.  CT of the abdomen and  pelvis revealed a mild peripancreatic inflammation.  There was no necrosis  or pseudocyst present.  There was some infarction of the left lateral  segment, probably secondary to retraction during surgery.  The patient was  stable overall and continued to improve.   On postoperative day #10, the patient's thyroid function tests were  significant for a TSH of 52.33, free T4 of 52, thyroxin of 4.3, and a T3  uptake of 25.3.  The patient sees Dr. Margaretmary Bayley regularly for her  thyroid disorder and, therefore, Dr. Chestine Spore was notified.  The patient was  doing well after discontinuation of NG tube.  She was having flatus and  small bowel movements.  The patient was started on clear liquid diet.  The  patient was ambulating well and continued to do so independently.   On postoperative day #11, Dr. Chestine Spore started patient on Levothyroxine.  The  patient was tolerating clear liquids without difficulty, and amylase  continued to trend down.  The patient's diet was slowly advanced from clear  liquids to soft diet and then to a full diet as her amylase trended down.  The patient continued to progress as expected postoperatively.  The patient  TNA was discontinued on postop day #16.  The patient was stable overall;  however, her white count was noted to be slightly elevated at 13.  The  source was questionable, but the patient's central line was discontinued as  a possible source.   On postoperative day 17, the patient was eating better.  She was afebrile,  and vital signs were stable.  The abdominal incision was intact; however, there was a small  stitch abscess at the superior portion.  The abdomen was  soft and nontender, and the patient's white count was trending down.  The  patient was felt to be in stable condition at this time.  As long as the  white count continues to improved, it is felt the patient will be ready for  discharge within the next one to two days.   LABORATORY AND X-RAY DATA:  CBC on December 14, 2003:  White count  12.4,  hemoglobin 10.9, hematocrit 32, platelets 507.  CMP on December 09, 2003:  Sodium 138, potassium 4.1, chloride 105, CO2 27, glucose 99, BUN 12,  creatinine 0.6, total bilirubin 0.4, alkaline phosphatase 122, SGOT 24, SGPT  43, total protein 6.3, albumin 3, calcium 8.6, phosphorus 4.5, magnesium  2.0, amylase 281, lipase 75.   CONDITION ON DISCHARGE:  Improved.   DISCHARGE MEDICATIONS:  The patient is to resume her home medications which  include;  1. Toprol XL 25 mg daily.  2. Singulair 10 mg daily.  3. Zyrtec.  4. She was given prescription for Synthroid 100 mcg daily.  5. Tylox 1 to 2 q.4-6h. p.r.n. pain.   ACTIVITY:  No driving, no strenuous activity, and the patient is to continue  daily breathing and walking exercises.   DIET:  The patient is to eat small, frequent meals high in protein.   WOUND CARE:  The patient may shower daily and clean the incisions with soap  and water.  If the incision becomes red, swollen, or drainage, or if patient  has fever of 101 degrees F, she is to call [facility] office.   FOLLOW UP:  The patient will follow up with Dr. Darrick Penna on Dec 24, 2003, at  10:30 a.m.  The patient must go to [facility] one hour prior to  that appointment to have CMP performed, specifically amylase and lipase  checked.   The patient is to call Dr. Chestine Spore, her endocrinologist and set up an  appointment with him one to two weeks after discharge for followup of  thyroid disease.      Pecola Leisure, PA                      Charles E. Fields, MD    AY/MEDQ  D:  12/10/2003   T:  12/12/2003  Job:  [id number]

## 2011-01-05 NOTE — Discharge Summary (Signed)
NAME:  Danielle Barrett, Danielle Barrett                            ACCOUNT NO.:  [id number]   MEDICAL RECORD NO.:  [id number]                   PATIENT TYPE:  INP   LOCATION:  6702                                 FACILITY:  [facility]   PHYSICIAN:  James L. Malon Kindle., M.D.          DATE OF BIRTH:  01/27/1961   DATE OF ADMISSION:  09/20/2003  DATE OF DISCHARGE:  09/22/2003                                 DISCHARGE SUMMARY   ADMISSION DIAGNOSIS:  Abdominal pain.   FINAL DIAGNOSIS:  Acalculous cholecystitis.   PERTINENT HISTORY:  A 50 year old who has had several months of epigastric  abdominal pain, worse after eating.  He has had endoscopy and colonoscopy.  Continues to feel sick. We sent for MRCP tomorrow to look at her biliary  system.  She came into the ER with worsening pain.  White count was 16,000.  She was quite tender in the right upper quadrant.  For more details, please  see dictated History and Physical.   HOSPITAL COURSE:  The patient was admitted to the medical floor where CT of  abdomen and pelvis was obtained as well as blood cultures.  The CT showed  markedly dilated gallbladder with gallbladder wall thickening,  pericholecystic fluid, and inflammatory changes.  This was felt to be  consistent with acalculous cholecystitis.  She was seen in consultation by  Dr. Avel Peace who agreed, and she underwent laparoscopic  cholecystectomy on February 1 with intraoperative cholangiogram that was  normal.   The patient did well postoperatively, and her pain was much better.  It was  felt that she was in satisfactory condition for discharge.   DISPOSITION:  The patient was discharged home.  I will plan on seeing her  back in the office for followup of her pain.  She will follow up with Dr.  Abbey Chatters and will continue all of her home medications.                                                James L. Malon Kindle., M.D.    Waldron Session  D:  11/06/2003  T:  11/08/2003  Job:  [id number]   cc:    Adolph Pollack, M.D.  [address]  [place]  [place] [address]  Fax: [phone]   Margaretmary Bayley, M.D.  [address]  [place]  [place] [address]  Fax: [phone]   Gabriel Earing, M.D.  [address]  [place]  [place] [address]  Fax: [phone]

## 2011-01-05 NOTE — Discharge Summary (Signed)
Danielle Barrett, Danielle Barrett NO.:  [id number]   MEDICAL RECORD NO.:  [id number]          PATIENT TYPE:  IPS   LOCATION:  0507                          [facility]ACILITY[facility]   PHYSICIAN:  Anselm Jungling, MD  DATE OF BIRTH:  1961-05-09   DATE OF ADMISSION:  05/27/2008  DATE OF DISCHARGE:  05/30/2008                               DISCHARGE SUMMARY   IDENTIFYING DATA/REASON FOR ADMISSION:  The patient is a 50 year old  female who was admitted on a voluntary basis due to increasing  depression and suicidal ideation.  Please refer to the admission note  for further details pertaining to the symptoms, circumstances and  history that led to her hospitalization.  She was given an initial Axis  I diagnosis of major depressive disorder, recurrent, and rule out  alcohol abuse.   MEDICAL/LABORATORY:  The patient was medically and physically assessed  by the psychiatric nurse practitioner.  She came to [place] with a history of  peripheral vascular disease, dyslipidemia, antithrombin  deficiency and  hypothyroidism.  She had been on nonpsychotropic regimen of  hydrochlorothiazide, Synthroid, Coumadin, Toprol, Altace, Zyrtec,  Singulair, and Tricor.  Her primary care physician is Dr. Nehemiah Settle at  [facility].  Her nonpsychotropic medications were followed closely.  There were no significant medical issues during this brief inpatient  psychiatric stay.   HOSPITAL COURSE:  The patient was admitted to the adult inpatient  psychiatric facility.  She presented as a well-nourished, normally-  developed adult female who was described as pleasant and cooperative in  the initial interview, but with blunted affect.  She was somewhat  tearful.  She appeared to be a fairly good historian.  Her mood was  depressed with sad affect.  Thought processes were logical with no  evidence of psychosis or homicidal thoughts, but she clearly had  suicidal thoughts and was hopeless about the future.  She was sad  about  the recent ending of her marriage and did not feel that there was much  hope for reconciliation.  Her insight level was felt to be good.   She was continued on her usual Lexapro, but the dose was increased to 20  mg daily.  She was involved in therapeutic groups and activities, and  was a good participant in the treatment program.  She was initially  evaluated by Dr. Milford Cage, and on the second hospital day, the  undersigned assumed her care.  At that time, she indicated I got real  depressed, I was going through a separation.  She described a complex  marital and family situation.  She denied any further suicidal ideation.  She stated that she felt her treatment was going pretty good.  She  stated God wants me here for a reason and I want to see my  grandchildren grow up.   She was also treated with low-dose Librium to address alcohol cessation  which she was in favor of.   On the third hospital day, the patient stated that she was feeling  better, still without suicidal ideation, and felt ready for discharge.  She agreed fully to the idea of outpatient treatment.  She reported that  her husband had visited her every day and was quite supportive in spite  of their marital crisis.  The patient was discharged according to her  wishes.  She agreed to the following aftercare plan.   AFTERCARE:  The patient was to follow up with [facility] for  individual therapy with an appointment on June 02, 2008.   DISCHARGE MEDICATIONS:  1. Lexapro 20 mg daily.  2. Toprol XL 50 mg daily.  3. Altace 2.5 mg daily.  4. Hydrochlorothiazide 25 mg daily.  5. Synthroid 50 mcg every other day alternating with 75 mcg every      other day.  6. Claritin 10 mg daily.  7. Singulair 10 mg daily.  8. Tricor 145 mg daily.  9. Coumadin per usual regimen.   DISCHARGE DIAGNOSES:  AXIS I:  Major depressive disorder, recurrent.  Marital problem.  Alcohol abuse.  AXIS II:  Deferred.  AXIS  III:  History of hypertension, hypothyroidism, peripheral vascular  disease.  AXIS IV:  Stressors severe.  AXIS V:  Global assessment of functioning on discharge 63.      Anselm Jungling, MD  Electronically Signed     SPB/MEDQ  D:  06/03/2008  T:  06/03/2008  Job:  [id number]

## 2011-02-10 ENCOUNTER — Inpatient Hospital Stay (HOSPITAL_COMMUNITY)
Admission: EM | Admit: 2011-02-10 | Discharge: 2011-02-11 | DRG: 313 | Disposition: A | Discharge status: Home / Self Care | Payer: Medicaid Other | Attending: Cardiology | Admitting: Cardiology

## 2011-02-10 ENCOUNTER — Emergency Department (HOSPITAL_COMMUNITY): Payer: Medicaid Other

## 2011-02-10 DIAGNOSIS — I739 Peripheral vascular disease, unspecified: Secondary | ICD-10-CM | POA: Diagnosis present

## 2011-02-10 DIAGNOSIS — F17200 Nicotine dependence, unspecified, uncomplicated: Secondary | ICD-10-CM | POA: Diagnosis present

## 2011-02-10 DIAGNOSIS — E0500 Thyrotoxicosis with diffuse goiter without thyrotoxic crisis or storm: Secondary | ICD-10-CM | POA: Diagnosis present

## 2011-02-10 DIAGNOSIS — R0789 Other chest pain: Principal | ICD-10-CM | POA: Diagnosis present

## 2011-02-10 DIAGNOSIS — Z9861 Coronary angioplasty status: Secondary | ICD-10-CM

## 2011-02-10 DIAGNOSIS — Z888 Allergy status to other drugs, medicaments and biological substances status: Secondary | ICD-10-CM

## 2011-02-10 DIAGNOSIS — F411 Generalized anxiety disorder: Secondary | ICD-10-CM | POA: Diagnosis present

## 2011-02-10 DIAGNOSIS — E785 Hyperlipidemia, unspecified: Secondary | ICD-10-CM | POA: Diagnosis present

## 2011-02-10 DIAGNOSIS — Z880 Allergy status to penicillin: Secondary | ICD-10-CM

## 2011-02-10 DIAGNOSIS — I2510 Atherosclerotic heart disease of native coronary artery without angina pectoris: Secondary | ICD-10-CM | POA: Diagnosis present

## 2011-02-10 DIAGNOSIS — I10 Essential (primary) hypertension: Secondary | ICD-10-CM | POA: Diagnosis present

## 2011-02-10 DIAGNOSIS — Z91012 Allergy to eggs: Secondary | ICD-10-CM

## 2011-02-10 DIAGNOSIS — D6859 Other primary thrombophilia: Secondary | ICD-10-CM | POA: Diagnosis present

## 2011-02-10 DIAGNOSIS — J45909 Unspecified asthma, uncomplicated: Secondary | ICD-10-CM | POA: Diagnosis present

## 2011-02-10 DIAGNOSIS — Z885 Allergy status to narcotic agent status: Secondary | ICD-10-CM

## 2011-02-10 LAB — DIFFERENTIAL
Basophils Absolute: 0.1 10*3/uL (ref 0.0–0.1)
Eosinophils Absolute: 0.3 10*3/uL (ref 0.0–0.7)
Eosinophils Relative: 2 % (ref 0–5)
Lymphs Abs: 2.3 10*3/uL (ref 0.7–4.0)
Monocytes Absolute: 0.6 10*3/uL (ref 0.1–1.0)

## 2011-02-10 LAB — COMPREHENSIVE METABOLIC PANEL
AST: 17 U/L (ref 0–37)
Albumin: 3.6 g/dL (ref 3.5–5.2)
Alkaline Phosphatase: 111 U/L (ref 39–117)
Chloride: 101 mEq/L (ref 96–112)
Potassium: 3.8 mEq/L (ref 3.5–5.1)
Sodium: 137 mEq/L (ref 135–145)
Total Bilirubin: 0.5 mg/dL (ref 0.3–1.2)

## 2011-02-10 LAB — TROPONIN I
Troponin I: <0.3 ng/mL (ref <0.30)
Troponin I: <0.3 ng/mL (ref <0.30)

## 2011-02-10 LAB — CK TOTAL AND CKMB (NOT AT ARMC)
CK, MB: 1.2 ng/mL (ref 0.3–4.0)
Relative Index: INVALID (ref 0.0–2.5)
Relative Index: INVALID (ref 0.0–2.5)

## 2011-02-10 LAB — CBC
MCH: 32.2 pg (ref 26.0–34.0)
MCV: 90.7 fL (ref 78.0–100.0)
Platelets: 298 10*3/uL (ref 150–400)
RDW: 13 % (ref 11.5–15.5)

## 2011-02-10 LAB — PROTIME-INR: Prothrombin Time: 31.8 seconds — ABNORMAL HIGH (ref 11.6–15.2)

## 2011-02-11 LAB — CK TOTAL AND CKMB (NOT AT ARMC): CK, MB: 1.2 ng/mL (ref 0.3–4.0)

## 2011-02-20 NOTE — Discharge Summary (Signed)
Danielle Barrett, Danielle Barrett NO.:  [id number]  MEDICAL RECORD NO.:  [id number]  LOCATION:  2025                         FACILITY:  [facility]  PHYSICIAN:  Georga Hacking, M.D.DATE OF BIRTH:  July 02, 1961  DATE OF ADMISSION:  02/10/2011 DATE OF DISCHARGE:  02/11/2011                              DISCHARGE SUMMARY   FINAL DIAGNOSES: 1. Prolonged chest pain of undetermined etiology. 2. Coronary artery disease with previous cutting balloon angioplasty     of the diagonal branch.     a.     Residual disease of the left anterior descending of 40% and      marginal of 80%.     b.     Normal left ventricular function. 3. History of heparin-induced thrombocytopenia. 4. Antithrombin III deficiency, on chronic warfarin anticoagulation. 5. Ongoing cigarette abuse. 6. Peripheral vascular disease with previous mesenteric ischemia and     mesenteric bypass. 7. Hypertension. 8. Hyperlipidemia. 9. Anxiety disorder.  HISTORY:  The patient is a 50 year old female with a long history of vascular disease.  A year ago, she had chest pain and had a cutting balloon angioplasty done of a severe diagonal stenosis with Dr. Eldridge Dace.  She 80% stenosis of the marginal branch and a mild LAD stenosis.  She presented to the emergency room with sharp chest pain radiating to her jaw and the left side of her chest that persisted. Point-of-care enzymes were negative and EKG showed minor ST changes. She also has chronic pain and has had adjustments of several of her pain medicines recently.  Please see the previously dictated history and physical for the remainder of the details.  HOSPITAL COURSE:  Her pain abated gradually with a whole lot of intervention other than the addition of some Imdur.  White count was 11,300.  Protime INR is 3.02.  Chemistry panel was normal.  Serial cardiac enzymes were negative with negative troponin and negative CPK- MB.  EKG was normal the next morning.  Chest x-ray  showed no disease. She wanted to go home the next day and so we sent her home to continue outpatient followup.  She will be seen later on this week at Dr. Anne Fu' office and there is a question of whether he could do stress testing or whether he should just continue to treat her medically at this point. She was not anticoagulated while she was in the hospital.  She is discharged on the following medications: 1. Aspirin 81 mg daily. 2. Isosorbide mononitrate 60 mg daily. 3. Metoprolol 25 b.i.d. 4. Alprazolam 0.5 mg 1-2 tablets 3 times a day as needed. 5. Colace 100 mg daily. 6. Warfarin 7.5 mg nightly. 7. Cymbalta 90 mg at bedtime. 8. Fish oil 1000 mg b.i.d. 9. Gabapentin 600 mg 1 in the morning and 3 at bedtime. 10.Levothyroxine 0.075 mg daily. 11.Lisinopril/hydrochlorothiazide 20/25 mg daily. 12.Morphine sulfate continuous release 15 mg 3 times a day. 13.Nitroglycerin p.r.n. 14.Pravachol 40 mg daily.  She is to follow up with Dr. Anne Fu later this week.     Georga Hacking, M.D.     WST/MEDQ  D:  02/11/2011  T:  02/11/2011  Job:  [id number]  cc:  Deirdre Peer. Polite, M.D.  Electronically Signed by Lacretia Nicks. Donnie Aho M.D. on 02/20/2011 05:12:38 PM

## 2011-02-20 NOTE — H&P (Signed)
Danielle Barrett, HAMRE NO.:  [id number]  MEDICAL RECORD NO.:  [id number]  LOCATION:  [facility]                         FACILITY:  [facility]  PHYSICIAN:  Georga Hacking, M.D.DATE OF BIRTH:  [birth year]  DATE OF ADMISSION:  02/10/2011                             HISTORY & PHYSICAL   REASON FOR ADMISSION:  Chest pain.  HISTORY OF PRESENT ILLNESS:  The patient is a very complex 50 year old female with a history of vascular disease.  She unfortunately continues to smoke.  She has a longstanding history of chest pain, precipitated by emotional stress.  She has a significant history of peripheral vascular disease and continues to smoke a pack of cigarettes per day.  She has a history of heparin-induced thrombosis in the past as well as hypertension and hyperlipidemia.  She was admitted with chest discomfort a year ago and had a cutting balloon angioplasty done of a severe diagonal stenosis by Dr. Eldridge Dace.  She had an 80% stenosis in the first marginal branch of the circumflex, but it was not felt good for intervention and a 40% LAD site.  She had the onset of sharp chest discomfort this morning that radiated up into her jaw and around the left side of her chest that persisted and she came to the emergency room.  Initial point-of-care enzymes were negative and the pain has been gradually abating and has been present for several hours.  She has a history of antithrombin III deficiency and is on chronic warfarin anticoagulation.  She has had a hysterectomy earlier this year for some sort of possible cancer and states that showed nerve damage from the surgery and is in a pain clinic now and has recently been placed on a higher dose of gabapentin as well as morphine every 8 hours and a higher dose of Cymbalta.  She is not currently having active chest discomfort.  PAST MEDICAL HISTORY:  Complex.  She has a history of antithrombin III deficiency, previous mesenteric ischemia  with previous bypass.  She has heparin-induced thrombocytopenia in the past and anxiety disorder with a previous hospitalization for suicidal ideation, asthma, a history of previous Graves' disease, hypertension.  PAST SURGICAL HISTORY:  Multiple abdominal surgeries secondary to thrombosis of the mesenteric artery after a mesenteric bypass, cesarean section, cholecystectomy, tubal ligation, and hysterectomy.  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  CURRENT MEDICATIONS:  Unfortunately, the family took the medicines home. We do not know the doses.  She is on alprazolam, Colace, Cymbalta, gabapentin, levothyroxine, lisinopril, morphine, nitroglycerin, and pravastatin.  She is supposed to be on aspirin.  SOCIAL HISTORY:  Previously worked as a Nutritional therapist.  She is single and lives alone, is previously divorced.  She smokes a pack of cigarettes per day for multiple years.  Has two children.  FAMILY HISTORY:  Positive for coronary artery disease at a young age a.  REVIEW OF SYSTEMS:  She complains of occasional headaches.  She has chronic pain due to her previous pelvic surgery.  She does not have any recent abdominal discomfort, diarrhea, or hematochezia.  No genitourinary  symptoms, but significant pain.  Other than as noted above, the remainder of review of systems unremarkable.  PHYSICAL EXAMINATION:  GENERAL:  She is a pleasant female appearing her stated age and currently in no acute distress. VITAL SIGNS:  Blood pressure currently 130/80, pulse currently 80 and regular. SKIN:  Warm and dry. HEENT:  EOMI.  PERRLA.  CNS clear.  Fundi not examined.  Pharynx negative. NECK:  Supple.  No masses, JVD, thyromegaly, or bruits. LUNGS:  Clear to A and P. CARDIAC:  Normal sinus rhythm.  No S3, S4, or murmur. ABDOMEN:  Soft, nontender.  Pulses present were 2+.  No edema is noted. NEUROLOGIC:   Normal cranial nerves.  Sensory and motor intact.  LABORATORY DATA:  EKG shows minor nonspecific ST and T-wave changes. White count is 11,300.  INR is 3.02.  Initial point-of-care enzymes was normal.  Liver enzymes were normal.  Glucose is 106.  IMPRESSION: 1. Prolonged chest discomfort with minor nonspecific changes on her     EKG. 2. Coronary artery disease with previous cutting balloon angioplasty     of the diagonal.     a.     Residual disease of 40% LAD and 80% of first marginal      branch.     b.     Normal LV function. 3. History of heparin-induced thrombosis. 4. Antithrombin III deficiency, on chronic warfarin anticoagulation. 5. Cigarette abuse ongoing. 6. History of mesenteric ischemia with previous mesenteric bypass with     redo and multiple small-bowel obstructions and resection of bowel. 7. Hypertension. 8. Hyperlipidemia.  RECOMMENDATIONS:  The patient is clinically very complex.  We will use no anticoagulation.  At the present time, her pain seems to have resolved.  Add nitroglycerin long-acting to her regimen.  Continue beta- blockers.  Unfortunately, all of her medicines were taken home.  We will hold her Coumadin for the time being and let it slowly drift down.  Dr. Anne Fu may or may not want to do a catheterization on Monday or may do stress testing.     Georga Hacking, M.D.     WST/MEDQ  D:  02/10/2011  T:  02/10/2011  Job:  [id number]  cc:   Jake Bathe, MD Deirdre Peer. Nehemiah Settle, M.D. Madaline Guthrie, MD  Electronically Signed by Lacretia Nicks. Donnie Aho M.D. on 02/20/2011 05:12:25 PM

## 2011-05-01 ENCOUNTER — Other Ambulatory Visit: Payer: Self-pay | Admitting: Lab

## 2011-05-01 DIAGNOSIS — I708 Atherosclerosis of other arteries: Secondary | ICD-10-CM

## 2011-05-03 ENCOUNTER — Other Ambulatory Visit (INDEPENDENT_AMBULATORY_CARE_PROVIDER_SITE_OTHER): Payer: Self-pay | Admitting: *Deleted

## 2011-05-03 DIAGNOSIS — K551 Chronic vascular disorders of intestine: Secondary | ICD-10-CM

## 2011-05-14 ENCOUNTER — Other Ambulatory Visit: Payer: Self-pay | Admitting: *Deleted

## 2011-05-14 DIAGNOSIS — I774 Celiac artery compression syndrome: Secondary | ICD-10-CM

## 2011-05-14 NOTE — Procedures (Unsigned)
VASCULAR LAB EXAM  INDICATION:  Chronic mesenteric ischemia with bifurcated aorta to celiac trunk and aorta to SMA graft performed for 2005.  Known occlusion of celiac trunk limb.  Revision and thrombectomy of the SFA limb, 01/2007.  Diabetes:  No Cardiac:  No Hypertension:  Yes. Smoking:  Yes  HISTORY:  EXAM: 1. Duplex of aorta to SMA graft is patent with elevated velocities in     the proximal segment, which appears to be a slight kink in the     graft, possibly at the graft-to-graft revision site. 2. Waveforms within the graft are low resistant with triphasic     waveforms in the distal SMA.  IMPRESSION:  Patent graft, as described above.  ___________________________________________ Janetta Hora. Fields, MD  LT/MEDQ  D:  05/03/2011  T:  05/03/2011  Job:  [id number]

## 2011-05-16 ENCOUNTER — Encounter: Payer: Self-pay | Admitting: Vascular Surgery

## 2011-05-22 LAB — COMPREHENSIVE METABOLIC PANEL
AST: 29
CO2: 27
Calcium: 9.6
Creatinine, Ser: 0.91
GFR calc Af Amer: >60
GFR calc non Af Amer: >60
Glucose, Bld: 159 — ABNORMAL HIGH

## 2011-05-22 LAB — TSH: TSH: 0.556

## 2011-05-22 LAB — PROTIME-INR: Prothrombin Time: 33.3 — ABNORMAL HIGH

## 2011-05-22 LAB — CBC
MCHC: 33.9
MCV: 96.1
RBC: 4.76

## 2011-05-22 LAB — APTT: aPTT: 50 — ABNORMAL HIGH

## 2011-06-01 LAB — CBC
Hemoglobin: 10.2 — ABNORMAL LOW
Hemoglobin: 10.4 — ABNORMAL LOW
MCHC: 33.2
MCHC: 33.4
MCHC: 33.6
MCHC: 33.6
MCHC: 33.8
MCHC: 33.8
MCHC: 33.9
MCV: 88.2
MCV: 88.8
MCV: 89.3
MCV: 89.4
MCV: 90
Platelets: 517 — ABNORMAL HIGH
Platelets: 566 — ABNORMAL HIGH
Platelets: 593 — ABNORMAL HIGH
Platelets: 722 — ABNORMAL HIGH
RBC: 3.55 — ABNORMAL LOW
RDW: 15 — ABNORMAL HIGH
RDW: 15 — ABNORMAL HIGH
RDW: 15.1 — ABNORMAL HIGH
RDW: 15.4 — ABNORMAL HIGH
RDW: 15.6 — ABNORMAL HIGH
RDW: 15.9 — ABNORMAL HIGH

## 2011-06-01 LAB — PROTIME-INR
INR: 1.3
INR: 1.3
INR: 1.3
INR: 1.3
INR: 1.6 — ABNORMAL HIGH
INR: 1.9 — ABNORMAL HIGH
INR: 2.6 — ABNORMAL HIGH
Prothrombin Time: 16.3 — ABNORMAL HIGH
Prothrombin Time: 16.5 — ABNORMAL HIGH
Prothrombin Time: 19.6 — ABNORMAL HIGH
Prothrombin Time: 22.5 — ABNORMAL HIGH
Prothrombin Time: 29 — ABNORMAL HIGH

## 2011-06-01 LAB — COMPREHENSIVE METABOLIC PANEL
ALT: 70 — ABNORMAL HIGH
BUN: 10
Calcium: 8.8
Creatinine, Ser: 0.53
Glucose, Bld: 77
Sodium: 141
Total Protein: 6

## 2011-06-01 LAB — DIFFERENTIAL
Basophils Relative: 0
Eosinophils Absolute: 0.2
Lymphocytes Relative: 17
Lymphs Abs: 1.9
Monocytes Relative: 6
Neutro Abs: 8.4 — ABNORMAL HIGH
Neutrophils Relative %: 76
Neutrophils Relative %: 79 — ABNORMAL HIGH

## 2011-06-01 LAB — PHOSPHORUS: Phosphorus: 4.6

## 2011-06-01 LAB — PREALBUMIN: Prealbumin: 21.7

## 2011-06-01 LAB — APTT: aPTT: 53 — ABNORMAL HIGH

## 2011-06-01 LAB — CHOLESTEROL, TOTAL: Cholesterol: 82

## 2011-06-04 LAB — BASIC METABOLIC PANEL
BUN: 11
BUN: 7
BUN: 9
BUN: 14
CO2: 25
CO2: 26
CO2: 26
CO2: 27
CO2: 23
CO2: 25
Calcium: 8.7
Calcium: 8.8
Chloride: 101
Chloride: 102
Chloride: 103
Chloride: 100
Chloride: 107
Chloride: 107
Creatinine, Ser: 0.4
Creatinine, Ser: 0.5
Creatinine, Ser: 0.58
GFR calc Af Amer: >60
GFR calc Af Amer: >60
GFR calc Af Amer: >60
GFR calc non Af Amer: >60
GFR calc non Af Amer: >60
GFR calc non Af Amer: >60
GFR calc non Af Amer: >60
GFR calc non Af Amer: >60
Glucose, Bld: 111 — ABNORMAL HIGH
Glucose, Bld: 114 — ABNORMAL HIGH
Glucose, Bld: 134 — ABNORMAL HIGH
Glucose, Bld: 74
Glucose, Bld: 74
Potassium: 3.7
Potassium: 3.9
Potassium: 4
Potassium: 4
Potassium: 4.2
Potassium: 4.5
Potassium: 4.1
Potassium: 4.1
Sodium: 135
Sodium: 136
Sodium: 137
Sodium: 138
Sodium: 136

## 2011-06-04 LAB — COMPREHENSIVE METABOLIC PANEL
ALT: 147 — ABNORMAL HIGH
ALT: 31
ALT: 17
AST: 21
AST: 11
AST: 15
Albumin: 1.4 — ABNORMAL LOW
Albumin: 1.9 — ABNORMAL LOW
Albumin: 2.2 — ABNORMAL LOW
Albumin: 2.5 — ABNORMAL LOW
Albumin: 2.7 — ABNORMAL LOW
Albumin: 3.9
Alkaline Phosphatase: 198 — ABNORMAL HIGH
Alkaline Phosphatase: 209 — ABNORMAL HIGH
Alkaline Phosphatase: 256 — ABNORMAL HIGH
Alkaline Phosphatase: 267 — ABNORMAL HIGH
Alkaline Phosphatase: 67
Alkaline Phosphatase: 70
Alkaline Phosphatase: 68
Alkaline Phosphatase: 70
Alkaline Phosphatase: 73
BUN: 10
BUN: 13
BUN: 7
BUN: 7
BUN: 7
BUN: 9
BUN: 14
BUN: 3 — ABNORMAL LOW
BUN: 6
CO2: 25
CO2: 26
CO2: 27
CO2: 23
Calcium: 7.8 — ABNORMAL LOW
Calcium: 7.9 — ABNORMAL LOW
Calcium: 8.6
Chloride: 104
Chloride: 104
Chloride: 105
Chloride: 106
Chloride: 107
Chloride: 109
Creatinine, Ser: 0.44
Creatinine, Ser: 0.66
Creatinine, Ser: 0.67
Creatinine, Ser: 0.58
Creatinine, Ser: 0.77
GFR calc Af Amer: >60
GFR calc Af Amer: >60
GFR calc non Af Amer: >60
GFR calc non Af Amer: >60
GFR calc non Af Amer: >60
GFR calc non Af Amer: >60
GFR calc non Af Amer: >60
Glucose, Bld: 235 — ABNORMAL HIGH
Glucose, Bld: 71
Glucose, Bld: 71
Glucose, Bld: 79
Glucose, Bld: 88
Glucose, Bld: 89
Glucose, Bld: 110 — ABNORMAL HIGH
Glucose, Bld: 126 — ABNORMAL HIGH
Potassium: 3.5
Potassium: 3.7
Potassium: 3.8
Potassium: 3.9
Potassium: 4.1
Potassium: 4.2
Potassium: 3.9
Potassium: 4.1
Potassium: 4.2
Sodium: 138
Sodium: 137
Total Bilirubin: 0.4
Total Bilirubin: 0.5
Total Bilirubin: 1.2
Total Bilirubin: 1.6 — ABNORMAL HIGH
Total Bilirubin: 1.9 — ABNORMAL HIGH
Total Bilirubin: 0.9
Total Bilirubin: 0.9
Total Bilirubin: 1
Total Protein: 4.5 — ABNORMAL LOW
Total Protein: 4.7 — ABNORMAL LOW
Total Protein: 4.8 — ABNORMAL LOW
Total Protein: 5.8 — ABNORMAL LOW
Total Protein: 5.6 — ABNORMAL LOW
Total Protein: 7.8

## 2011-06-04 LAB — CBC
HCT: 23.1 — ABNORMAL LOW
HCT: 28.1 — ABNORMAL LOW
HCT: 28.3 — ABNORMAL LOW
HCT: 28.6 — ABNORMAL LOW
HCT: 28.7 — ABNORMAL LOW
HCT: 29.4 — ABNORMAL LOW
HCT: 29.6 — ABNORMAL LOW
HCT: 30 — ABNORMAL LOW
HCT: 30.1 — ABNORMAL LOW
HCT: 30.4 — ABNORMAL LOW
HCT: 30.5 — ABNORMAL LOW
HCT: 31.2 — ABNORMAL LOW
HCT: 32.7 — ABNORMAL LOW
HCT: 32.9 — ABNORMAL LOW
HCT: 33.4 — ABNORMAL LOW
HCT: 33.6 — ABNORMAL LOW
HCT: 33.7 — ABNORMAL LOW
HCT: 34.2 — ABNORMAL LOW
HCT: 34.7 — ABNORMAL LOW
HCT: 44.2
Hemoglobin: 10 — ABNORMAL LOW
Hemoglobin: 10.1 — ABNORMAL LOW
Hemoglobin: 10.2 — ABNORMAL LOW
Hemoglobin: 10.3 — ABNORMAL LOW
Hemoglobin: 10.7 — ABNORMAL LOW
Hemoglobin: 11 — ABNORMAL LOW
Hemoglobin: 7.7 — CL
Hemoglobin: 9.7 — ABNORMAL LOW
Hemoglobin: 9.7 — ABNORMAL LOW
Hemoglobin: 9.8 — ABNORMAL LOW
Hemoglobin: 9.9 — ABNORMAL LOW
Hemoglobin: 10.7 — ABNORMAL LOW
Hemoglobin: 11.1 — ABNORMAL LOW
Hemoglobin: 11.2 — ABNORMAL LOW
Hemoglobin: 11.4 — ABNORMAL LOW
Hemoglobin: 11.5 — ABNORMAL LOW
Hemoglobin: 11.7 — ABNORMAL LOW
MCHC: 33.3
MCHC: 33.3
MCHC: 33.4
MCHC: 33.5
MCHC: 33.6
MCHC: 33.6
MCHC: 33.7
MCHC: 33.7
MCHC: 33.8
MCHC: 34.1
MCHC: 34.1
MCHC: 33.8
MCHC: 33.8
MCHC: 34
MCHC: 34
MCHC: 34.1
MCV: 88.6
MCV: 88.9
MCV: 89.8
MCV: 91.1
MCV: 91.8
MCV: 92.4
MCV: 92.7
MCV: 93.3
MCV: 90.2
MCV: 91.7
MCV: 91.8
MCV: 91.9
MCV: 92
MCV: 92.3
MCV: 92.5
MCV: 93
Platelets: 275
Platelets: 280
Platelets: 298
Platelets: 312
Platelets: 346
Platelets: 394
Platelets: 313
Platelets: 322
Platelets: 325
Platelets: 351
Platelets: 368
Platelets: 374
Platelets: 456 — ABNORMAL HIGH
RBC: 3.19 — ABNORMAL LOW
RBC: 3.21 — ABNORMAL LOW
RBC: 3.26 — ABNORMAL LOW
RBC: 3.3 — ABNORMAL LOW
RBC: 3.31 — ABNORMAL LOW
RBC: 3.34 — ABNORMAL LOW
RBC: 3.36 — ABNORMAL LOW
RBC: 3.37 — ABNORMAL LOW
RBC: 3.58 — ABNORMAL LOW
RBC: 3.56 — ABNORMAL LOW
RBC: 3.67 — ABNORMAL LOW
RBC: 3.68 — ABNORMAL LOW
RBC: 3.73 — ABNORMAL LOW
RBC: 3.85 — ABNORMAL LOW
RBC: 3.88
RDW: 13.9
RDW: 14.1 — ABNORMAL HIGH
RDW: 14.1 — ABNORMAL HIGH
RDW: 14.3 — ABNORMAL HIGH
RDW: 14.4 — ABNORMAL HIGH
RDW: 14.4 — ABNORMAL HIGH
RDW: 14.5 — ABNORMAL HIGH
RDW: 14.7 — ABNORMAL HIGH
RDW: 15.3 — ABNORMAL HIGH
RDW: 15.3 — ABNORMAL HIGH
RDW: 15.5 — ABNORMAL HIGH
RDW: 15.5 — ABNORMAL HIGH
RDW: 15.6 — ABNORMAL HIGH
RDW: 13.8
RDW: 14.1 — ABNORMAL HIGH
RDW: 14.1 — ABNORMAL HIGH
RDW: 14.1 — ABNORMAL HIGH
RDW: 14.3 — ABNORMAL HIGH
RDW: 14.5 — ABNORMAL HIGH
WBC: 12.7 — ABNORMAL HIGH
WBC: 14.6 — ABNORMAL HIGH
WBC: 17.3 — ABNORMAL HIGH
WBC: 20.4 — ABNORMAL HIGH
WBC: 8.3
WBC: 9.3
WBC: 5.3
WBC: 6.7
WBC: 7
WBC: 7.4
WBC: 7.6
WBC: 7.9
WBC: 8.6
WBC: 8.7

## 2011-06-04 LAB — CROSSMATCH

## 2011-06-04 LAB — PREPARE FRESH FROZEN PLASMA

## 2011-06-04 LAB — DIFFERENTIAL
Basophils Absolute: 0
Basophils Absolute: 0
Basophils Absolute: 0
Basophils Absolute: 0.1
Basophils Absolute: 0.1
Basophils Relative: 0
Basophils Relative: 0
Basophils Relative: 0
Basophils Relative: 0
Basophils Relative: 1
Basophils Relative: 1
Basophils Relative: 1
Eosinophils Absolute: 0.4
Eosinophils Absolute: 0.3
Eosinophils Absolute: 0.3
Eosinophils Relative: 3
Lymphocytes Relative: 3 — ABNORMAL LOW
Lymphocytes Relative: 6 — ABNORMAL LOW
Lymphocytes Relative: 14
Lymphocytes Relative: 23
Lymphocytes Relative: 23
Lymphs Abs: 0.3 — ABNORMAL LOW
Lymphs Abs: 0.8
Lymphs Abs: 1.5
Lymphs Abs: 1.6
Monocytes Absolute: 0.4
Monocytes Absolute: 0.6
Monocytes Absolute: 0.5
Monocytes Absolute: 0.5
Monocytes Absolute: 0.7
Monocytes Absolute: 0.7
Monocytes Relative: 3
Monocytes Relative: 4
Monocytes Relative: 4
Monocytes Relative: 5
Monocytes Relative: 6
Monocytes Relative: 7
Neutro Abs: 10.2 — ABNORMAL HIGH
Neutro Abs: 12 — ABNORMAL HIGH
Neutro Abs: 12.8 — ABNORMAL HIGH
Neutro Abs: 9.9 — ABNORMAL HIGH
Neutro Abs: 11.4 — ABNORMAL HIGH
Neutro Abs: 4.4
Neutro Abs: 5.7
Neutrophils Relative %: 83 — ABNORMAL HIGH
Neutrophils Relative %: 84 — ABNORMAL HIGH
Neutrophils Relative %: 88 — ABNORMAL HIGH
Neutrophils Relative %: 95 — ABNORMAL HIGH
Neutrophils Relative %: 65
Neutrophils Relative %: 66
Neutrophils Relative %: 67

## 2011-06-04 LAB — PROTIME-INR
INR: 1.2
INR: 1.2
INR: 1.3
INR: 1.3
INR: 1.2
INR: 1.9 — ABNORMAL HIGH
INR: 2 — ABNORMAL HIGH
Prothrombin Time: 16 — ABNORMAL HIGH
Prothrombin Time: 17.7 — ABNORMAL HIGH
Prothrombin Time: 18.6 — ABNORMAL HIGH
Prothrombin Time: 22.3 — ABNORMAL HIGH
Prothrombin Time: 23.5 — ABNORMAL HIGH

## 2011-06-04 LAB — URINE CULTURE: Culture: NO GROWTH

## 2011-06-04 LAB — MAGNESIUM
Magnesium: 1.9
Magnesium: 2
Magnesium: 2.1
Magnesium: 2.1
Magnesium: 2.1
Magnesium: 2.3

## 2011-06-04 LAB — WOUND CULTURE

## 2011-06-04 LAB — APTT
aPTT: 28
aPTT: 36
aPTT: 57 — ABNORMAL HIGH
aPTT: 60 — ABNORMAL HIGH
aPTT: 66 — ABNORMAL HIGH
aPTT: 76 — ABNORMAL HIGH
aPTT: 79 — ABNORMAL HIGH
aPTT: 59 — ABNORMAL HIGH
aPTT: 64 — ABNORMAL HIGH
aPTT: 66 — ABNORMAL HIGH
aPTT: 72 — ABNORMAL HIGH
aPTT: 83 — ABNORMAL HIGH

## 2011-06-04 LAB — PHOSPHORUS
Phosphorus: 2.6
Phosphorus: 3.5
Phosphorus: 4.4
Phosphorus: 4.7 — ABNORMAL HIGH
Phosphorus: 3.9
Phosphorus: 4.1

## 2011-06-04 LAB — POCT I-STAT 3, ART BLOOD GAS (G3+)
Acid-Base Excess: 2
Bicarbonate: 20.2
Bicarbonate: 24.5 — ABNORMAL HIGH
O2 Saturation: 98
Operator id: 270211
Patient temperature: 101.1
TCO2: 21
pH, Arterial: 7.34 — ABNORMAL LOW
pO2, Arterial: 88

## 2011-06-04 LAB — PREALBUMIN
Prealbumin: 11.3 — ABNORMAL LOW
Prealbumin: 7.9 — ABNORMAL LOW
Prealbumin: 14.7 — ABNORMAL LOW

## 2011-06-04 LAB — URINALYSIS, ROUTINE W REFLEX MICROSCOPIC
Glucose, UA: NEGATIVE
Hgb urine dipstick: NEGATIVE
Specific Gravity, Urine: 1.015
pH: 7

## 2011-06-04 LAB — AMYLASE: Amylase: 61

## 2011-06-04 LAB — CULTURE, BLOOD (ROUTINE X 2)
Culture: NO GROWTH
Culture: NO GROWTH

## 2011-06-04 LAB — TSH: TSH: 0.232 — ABNORMAL LOW

## 2011-06-04 LAB — HEMOGLOBIN A1C: Hgb A1c MFr Bld: 5.6

## 2011-06-04 LAB — POCT I-STAT 4, (NA,K, GLUC, HGB,HCT): Hemoglobin: 9.5 — ABNORMAL LOW

## 2011-06-04 LAB — HEPATITIS PANEL, ACUTE
HCV Ab: NEGATIVE
Hep B C IgM: NEGATIVE
Hepatitis B Surface Ag: NEGATIVE

## 2011-06-04 LAB — HEMOGLOBIN AND HEMATOCRIT, BLOOD
HCT: 22.2 — ABNORMAL LOW
Hemoglobin: 7.4 — CL

## 2011-06-04 LAB — TRIGLYCERIDES: Triglycerides: 146

## 2011-06-04 LAB — CHOLESTEROL, TOTAL: Cholesterol: 58

## 2011-06-05 LAB — PROTIME-INR
INR: 1.5
INR: 1.6 — ABNORMAL HIGH
INR: 2.7 — ABNORMAL HIGH
Prothrombin Time: 18.8 — ABNORMAL HIGH
Prothrombin Time: 19.6 — ABNORMAL HIGH
Prothrombin Time: 30.2 — ABNORMAL HIGH

## 2011-06-05 LAB — CBC
HCT: 32.2 — ABNORMAL LOW
HCT: 36.4
HCT: 39.1
Hemoglobin: 10.9 — ABNORMAL LOW
Hemoglobin: 10.9 — ABNORMAL LOW
Hemoglobin: 11.1 — ABNORMAL LOW
Hemoglobin: 13.4
MCHC: 33.7
MCHC: 34
MCV: 91.7
Platelets: 396
Platelets: 432 — ABNORMAL HIGH
RBC: 3.49 — ABNORMAL LOW
RBC: 3.52 — ABNORMAL LOW
RBC: 3.59 — ABNORMAL LOW
RBC: 4.32
RDW: 13.8
RDW: 14
RDW: 14.2 — ABNORMAL HIGH
WBC: 11 — ABNORMAL HIGH
WBC: 6.5
WBC: 6.6

## 2011-06-05 LAB — HEPATIC FUNCTION PANEL
ALT: 17
AST: 21
Alkaline Phosphatase: 83
Bilirubin, Direct: 0.4 — ABNORMAL HIGH
Indirect Bilirubin: 1 — ABNORMAL HIGH

## 2011-06-05 LAB — I-STAT 8, (EC8 V) (CONVERTED LAB)
Acid-Base Excess: 5 — ABNORMAL HIGH
Bicarbonate: 27.3 — ABNORMAL HIGH
Potassium: 3.8
TCO2: 28
pH, Ven: 7.54 — ABNORMAL HIGH

## 2011-06-05 LAB — POCT I-STAT CREATININE
Creatinine, Ser: 0.7
Operator id: 146091

## 2011-06-05 LAB — DIFFERENTIAL
Eosinophils Relative: 1
Lymphocytes Relative: 21
Lymphs Abs: 2.3
Monocytes Absolute: 0.6

## 2011-06-05 LAB — URINE MICROSCOPIC-ADD ON

## 2011-06-05 LAB — URINALYSIS, ROUTINE W REFLEX MICROSCOPIC
Glucose, UA: NEGATIVE
Ketones, ur: NEGATIVE
Nitrite: NEGATIVE
pH: 7

## 2011-06-05 LAB — APTT
aPTT: 50 — ABNORMAL HIGH
aPTT: 56 — ABNORMAL HIGH
aPTT: 57 — ABNORMAL HIGH

## 2011-06-05 LAB — BASIC METABOLIC PANEL
CO2: 25
Calcium: 8.3 — ABNORMAL LOW
Calcium: 8.7
Creatinine, Ser: 0.49
Creatinine, Ser: 0.57
GFR calc Af Amer: >60
GFR calc Af Amer: >60
GFR calc non Af Amer: >60
GFR calc non Af Amer: >60
Sodium: 139
Sodium: 142

## 2011-06-05 LAB — SAMPLE TO BLOOD BANK

## 2011-06-06 LAB — CBC
HCT: 35.6 — ABNORMAL LOW
Hemoglobin: 10.6 — ABNORMAL LOW
Hemoglobin: 10.6 — ABNORMAL LOW
Hemoglobin: 11.1 — ABNORMAL LOW
MCHC: 33.2
MCHC: 33.3
MCHC: 33.3
MCHC: 33.5
MCHC: 33.6
MCHC: 33.7
MCHC: 33.9
MCHC: 34.2
MCV: 92.2
MCV: 92.8
MCV: 92.8
MCV: 93.3
MCV: 94.4
Platelets: 294
Platelets: 320
Platelets: 499 — ABNORMAL HIGH
Platelets: 518 — ABNORMAL HIGH
Platelets: 525 — ABNORMAL HIGH
Platelets: 551 — ABNORMAL HIGH
RBC: 3.23 — ABNORMAL LOW
RBC: 3.52 — ABNORMAL LOW
RBC: 3.54 — ABNORMAL LOW
RBC: 3.75 — ABNORMAL LOW
RBC: 3.77 — ABNORMAL LOW
RDW: 14.6 — ABNORMAL HIGH
RDW: 14.6 — ABNORMAL HIGH
RDW: 14.6 — ABNORMAL HIGH
RDW: 14.7 — ABNORMAL HIGH
RDW: 14.7 — ABNORMAL HIGH
RDW: 14.9 — ABNORMAL HIGH
WBC: 12.1 — ABNORMAL HIGH
WBC: 15 — ABNORMAL HIGH

## 2011-06-06 LAB — BASIC METABOLIC PANEL
BUN: 5 — ABNORMAL LOW
BUN: 7
CO2: 24
CO2: 28
Calcium: 8.1 — ABNORMAL LOW
Calcium: 8.3 — ABNORMAL LOW
Calcium: 8.7
Chloride: 104
Creatinine, Ser: 0.46
Creatinine, Ser: 0.55
GFR calc Af Amer: >60
GFR calc Af Amer: >60
GFR calc non Af Amer: >60
GFR calc non Af Amer: >60
GFR calc non Af Amer: >60
Glucose, Bld: 112 — ABNORMAL HIGH
Glucose, Bld: 124 — ABNORMAL HIGH
Glucose, Bld: 92
Potassium: 3.8
Potassium: 4.3
Sodium: 135
Sodium: 138
Sodium: 138

## 2011-06-06 LAB — COMPREHENSIVE METABOLIC PANEL
ALT: 15
ALT: 18
AST: 15
Alkaline Phosphatase: 73
Alkaline Phosphatase: 79
CO2: 25
CO2: 26
Calcium: 8.4
Chloride: 104
GFR calc Af Amer: >60
GFR calc non Af Amer: >60
GFR calc non Af Amer: >60
Glucose, Bld: 119 — ABNORMAL HIGH
Glucose, Bld: 92
Potassium: 4
Potassium: 4
Sodium: 133 — ABNORMAL LOW
Sodium: 137
Total Bilirubin: 0.6
Total Protein: 6

## 2011-06-06 LAB — PROTIME-INR
INR: 1.2
INR: 1.2
INR: 1.2
INR: 1.4
INR: 1.9 — ABNORMAL HIGH
INR: 2.1 — ABNORMAL HIGH
Prothrombin Time: 15.2
Prothrombin Time: 15.3 — ABNORMAL HIGH
Prothrombin Time: 17.7 — ABNORMAL HIGH
Prothrombin Time: 17.8 — ABNORMAL HIGH
Prothrombin Time: 20.1 — ABNORMAL HIGH
Prothrombin Time: 21.8 — ABNORMAL HIGH
Prothrombin Time: 23.1 — ABNORMAL HIGH
Prothrombin Time: 24.7 — ABNORMAL HIGH

## 2011-06-06 LAB — PHOSPHORUS: Phosphorus: 4.9 — ABNORMAL HIGH

## 2011-06-06 LAB — APTT
aPTT: 47 — ABNORMAL HIGH
aPTT: 51 — ABNORMAL HIGH
aPTT: 54 — ABNORMAL HIGH
aPTT: 61 — ABNORMAL HIGH
aPTT: 67 — ABNORMAL HIGH
aPTT: 68 — ABNORMAL HIGH

## 2011-06-06 LAB — CHOLESTEROL, TOTAL: Cholesterol: 90

## 2011-06-06 LAB — MAGNESIUM: Magnesium: 2

## 2011-06-06 LAB — PREALBUMIN: Prealbumin: 19.8

## 2011-06-07 LAB — CBC
HCT: 21.6 — ABNORMAL LOW
HCT: 24.8 — ABNORMAL LOW
HCT: 24.8 — ABNORMAL LOW
HCT: 25.4 — ABNORMAL LOW
HCT: 25.6 — ABNORMAL LOW
HCT: 26.3 — ABNORMAL LOW
HCT: 26.5 — ABNORMAL LOW
HCT: 28.6 — ABNORMAL LOW
HCT: 43.7
Hemoglobin: 11.4 — ABNORMAL LOW
Hemoglobin: 8.1 — ABNORMAL LOW
Hemoglobin: 8.4 — ABNORMAL LOW
Hemoglobin: 8.7 — ABNORMAL LOW
Hemoglobin: 8.7 — ABNORMAL LOW
Hemoglobin: 9.9 — ABNORMAL LOW
MCHC: 33.6
MCHC: 33.7
MCHC: 33.8
MCHC: 33.9
MCHC: 34.3
MCHC: 34.3
MCHC: 34.4
MCHC: 34.4
MCHC: 34.4
MCHC: 34.4
MCHC: 34.8
MCHC: 35.2
MCV: 91.9
MCV: 93.8
MCV: 94
MCV: 94.4
MCV: 95.1
Platelets: 152
Platelets: 206
Platelets: 215
Platelets: 220
Platelets: 253
Platelets: 255
Platelets: 390
Platelets: 403 — ABNORMAL HIGH
Platelets: 486 — ABNORMAL HIGH
RBC: 2.3 — ABNORMAL LOW
RBC: 2.5 — ABNORMAL LOW
RBC: 2.56 — ABNORMAL LOW
RBC: 2.64 — ABNORMAL LOW
RBC: 2.65 — ABNORMAL LOW
RBC: 2.67 — ABNORMAL LOW
RBC: 2.67 — ABNORMAL LOW
RBC: 3.64 — ABNORMAL LOW
RBC: 4.18
RDW: 13.3
RDW: 13.3
RDW: 13.4
RDW: 13.4
RDW: 13.4
RDW: 13.4
RDW: 13.9
RDW: 14.3 — ABNORMAL HIGH
RDW: 14.6 — ABNORMAL HIGH
RDW: 14.7 — ABNORMAL HIGH
WBC: 15.7 — ABNORMAL HIGH
WBC: 17.7 — ABNORMAL HIGH
WBC: 18.4 — ABNORMAL HIGH
WBC: 18.7 — ABNORMAL HIGH
WBC: 19.3 — ABNORMAL HIGH
WBC: 20.2 — ABNORMAL HIGH
WBC: 22.3 — ABNORMAL HIGH
WBC: 23.3 — ABNORMAL HIGH
WBC: 30.7 — ABNORMAL HIGH

## 2011-06-07 LAB — URINALYSIS, ROUTINE W REFLEX MICROSCOPIC
Hgb urine dipstick: NEGATIVE
Ketones, ur: NEGATIVE
Nitrite: NEGATIVE
Nitrite: NEGATIVE
Protein, ur: NEGATIVE
Specific Gravity, Urine: 1.015 (ref 1.005–1.035)
Specific Gravity, Urine: 1.031 — ABNORMAL HIGH
Urobilinogen, UA: 0.2
Urobilinogen, UA: 1
pH: 5.5

## 2011-06-07 LAB — I-STAT EC8
BUN: 9
BUN: 9
Bicarbonate: 18.1 — ABNORMAL LOW
Chloride: 107
Chloride: 107
Glucose, Bld: 129 — ABNORMAL HIGH
HCT: 27 — ABNORMAL LOW
HCT: 35 — ABNORMAL LOW
Hemoglobin: 9.2 — ABNORMAL LOW
Operator id: 132841
Potassium: 3.9
Sodium: 139
pCO2 arterial: 38.3
pH, Arterial: 7.434 — ABNORMAL HIGH

## 2011-06-07 LAB — POCT I-STAT 3, ART BLOOD GAS (G3+)
O2 Saturation: 96
O2 Saturation: 97
Operator id: 135351
TCO2: 22
pCO2 arterial: 34.9 — ABNORMAL LOW
pCO2 arterial: 36.9
pCO2 arterial: 37.2
pH, Arterial: 7.394
pH, Arterial: 7.414 — ABNORMAL HIGH
pO2, Arterial: 88

## 2011-06-07 LAB — TRIGLYCERIDES
Triglycerides: 180 — ABNORMAL HIGH
Triglycerides: 193 — ABNORMAL HIGH
Triglycerides: 253 — ABNORMAL HIGH

## 2011-06-07 LAB — BLOOD GAS, ARTERIAL
Acid-base deficit: 3.1 — ABNORMAL HIGH
Bicarbonate: 21.5
Bicarbonate: 23.4
FIO2: 0.21
O2 Saturation: 54.4
Patient temperature: 37
TCO2: 22.8
pCO2 arterial: 34.2 — ABNORMAL LOW
pCO2 arterial: 39.9
pH, Arterial: 7.451 — ABNORMAL HIGH
pO2, Arterial: 32.6 — CL

## 2011-06-07 LAB — HDL CHOLESTEROL: HDL: 15 — ABNORMAL LOW

## 2011-06-07 LAB — PREALBUMIN
Prealbumin: 4 — ABNORMAL LOW
Prealbumin: 7 — ABNORMAL LOW

## 2011-06-07 LAB — BASIC METABOLIC PANEL
BUN: 6
BUN: 6
BUN: 8
CO2: 22
CO2: 25
CO2: 25
CO2: 26
CO2: 30
Calcium: 7.4 — ABNORMAL LOW
Calcium: 7.6 — ABNORMAL LOW
Calcium: 8.1 — ABNORMAL LOW
Calcium: 8.4
Chloride: 105
Chloride: 105
Chloride: 111
Creatinine, Ser: 0.39 — ABNORMAL LOW
Creatinine, Ser: 0.42
Creatinine, Ser: 0.53
Creatinine, Ser: 0.55
Creatinine, Ser: 0.56
GFR calc Af Amer: >60
GFR calc Af Amer: >60
GFR calc Af Amer: >60
GFR calc Af Amer: >60
GFR calc non Af Amer: >60
GFR calc non Af Amer: >60
GFR calc non Af Amer: >60
Glucose, Bld: 130 — ABNORMAL HIGH
Glucose, Bld: 132 — ABNORMAL HIGH
Glucose, Bld: 132 — ABNORMAL HIGH
Glucose, Bld: 156 — ABNORMAL HIGH
Potassium: 3.2 — ABNORMAL LOW
Potassium: 3.3 — ABNORMAL LOW
Potassium: 4.2
Potassium: 4.7
Sodium: 138
Sodium: 140

## 2011-06-07 LAB — COMPREHENSIVE METABOLIC PANEL
ALT: 146 — ABNORMAL HIGH
ALT: 29
ALT: 78 — ABNORMAL HIGH
ALT: 92 — ABNORMAL HIGH
AST: 118 — ABNORMAL HIGH
AST: 160 — ABNORMAL HIGH
AST: 21
AST: 35
AST: 92 — ABNORMAL HIGH
AST: 99 — ABNORMAL HIGH
Albumin: 1.6 — ABNORMAL LOW
Albumin: 1.7 — ABNORMAL LOW
Albumin: 2 — ABNORMAL LOW
Albumin: 3.7
Alkaline Phosphatase: 38 — ABNORMAL LOW
Alkaline Phosphatase: 39
Alkaline Phosphatase: 57
Alkaline Phosphatase: 90
BUN: 5 — ABNORMAL LOW
BUN: 6
BUN: 8
CO2: 24
CO2: 25
CO2: 28
Calcium: 7.3 — ABNORMAL LOW
Calcium: 7.4 — ABNORMAL LOW
Calcium: 7.4 — ABNORMAL LOW
Calcium: 8.4
Chloride: 103
Chloride: 103
Chloride: 104
Chloride: 104
Chloride: 109
Creatinine, Ser: 0.4
Creatinine, Ser: 0.43
Creatinine, Ser: 0.63
GFR calc Af Amer: >60
GFR calc Af Amer: >60
GFR calc Af Amer: >60
GFR calc Af Amer: >60
GFR calc Af Amer: >60
GFR calc non Af Amer: >60
GFR calc non Af Amer: >60
GFR calc non Af Amer: >60
Glucose, Bld: 128 — ABNORMAL HIGH
Glucose, Bld: 78
Glucose, Bld: 95
Potassium: 3.1 — ABNORMAL LOW
Potassium: 3.5
Potassium: 3.6
Potassium: 3.7
Potassium: 4.1
Sodium: 136
Sodium: 137
Sodium: 140
Sodium: 140
Total Bilirubin: 0.6
Total Bilirubin: 0.8
Total Protein: 3.9 — ABNORMAL LOW
Total Protein: 4.1 — ABNORMAL LOW
Total Protein: 4.3 — ABNORMAL LOW
Total Protein: 5.9 — ABNORMAL LOW
Total Protein: 6.8

## 2011-06-07 LAB — POCT I-STAT 7, (LYTES, BLD GAS, ICA,H+H)
Bicarbonate: 24.4 — ABNORMAL HIGH
Calcium, Ion: 1.08 — ABNORMAL LOW
Calcium, Ion: 1.12
HCT: 23 — ABNORMAL LOW
Hemoglobin: 7.8 — CL
Hemoglobin: 8.2 — ABNORMAL LOW
Hemoglobin: 8.5 — ABNORMAL LOW
Operator id: 198871
Operator id: 198871
Potassium: 3.2 — ABNORMAL LOW
Potassium: 3.5
Sodium: 139
Sodium: 143
TCO2: 22
TCO2: 24
pCO2 arterial: 33.4 — ABNORMAL LOW
pH, Arterial: 7.401 — ABNORMAL HIGH
pH, Arterial: 7.464 — ABNORMAL HIGH
pH, Arterial: 7.473 — ABNORMAL HIGH
pO2, Arterial: 349 — ABNORMAL HIGH

## 2011-06-07 LAB — URINE MICROSCOPIC-ADD ON: WBC, UA: NONE SEEN

## 2011-06-07 LAB — LACTIC ACID, PLASMA
Lactic Acid, Venous: 0.7
Lactic Acid, Venous: 1
Lactic Acid, Venous: 1.3
Lactic Acid, Venous: 1.3
Lactic Acid, Venous: 2.7 — ABNORMAL HIGH
Lactic Acid, Venous: 2.9 — ABNORMAL HIGH
Lactic Acid, Venous: 4.5 — ABNORMAL HIGH

## 2011-06-07 LAB — HEPARIN INDUCED PLATELET AGGREGATION (CONVERTED LAB)
Interpretation-HITDU: NORMAL
Saline Donor:: 0
Saline Patient:: 2

## 2011-06-07 LAB — URINE CULTURE
Colony Count: 5000
Culture: NO GROWTH

## 2011-06-07 LAB — CROSSMATCH: ABO/RH(D): O POS

## 2011-06-07 LAB — CHOLESTEROL, TOTAL: Cholesterol: 74

## 2011-06-07 LAB — HEPARIN LEVEL (UNFRACTIONATED)
Heparin Unfractionated: 0.2 — ABNORMAL LOW
Heparin Unfractionated: 0.26 — ABNORMAL LOW
Heparin Unfractionated: 0.34
Heparin Unfractionated: 0.39
Heparin Unfractionated: 0.42
Heparin Unfractionated: 0.46
Heparin Unfractionated: 0.56
Heparin Unfractionated: <0.1 — ABNORMAL LOW
Heparin Unfractionated: <0.1 — ABNORMAL LOW
Heparin Unfractionated: <0.1 — ABNORMAL LOW
Heparin Unfractionated: <0.1 — ABNORMAL LOW
Heparin Unfractionated: <0.1 — ABNORMAL LOW

## 2011-06-07 LAB — LIPASE, BLOOD
Lipase: 26
Lipase: 40

## 2011-06-07 LAB — DIFFERENTIAL
Basophils Absolute: 0
Basophils Relative: 0
Eosinophils Absolute: 0
Lymphocytes Relative: 5 — ABNORMAL LOW
Lymphs Abs: 1.5
Monocytes Absolute: 1.5 — ABNORMAL HIGH
Neutro Abs: 27.7 — ABNORMAL HIGH

## 2011-06-07 LAB — HEPARIN ASSOCIATED ANTIBODY DETECTION (CONVERTED LAB): Heparin Associated Ab Detection: POSITIVE — AB

## 2011-06-07 LAB — PHOSPHORUS: Phosphorus: 2.4

## 2011-06-07 LAB — FACTOR 5 LEIDEN

## 2011-06-07 LAB — MAGNESIUM
Magnesium: 1.9
Magnesium: 2.1

## 2011-06-07 LAB — AMYLASE: Amylase: 195 — ABNORMAL HIGH

## 2011-06-07 LAB — HEPARIN ANTIBODY SCREEN: Heparin Antibody Screen: POSITIVE

## 2011-06-07 LAB — HEPATITIS C ANTIBODY, REFLEX: HCV Ab: NEGATIVE

## 2011-06-07 LAB — HIV RAPID SCREEN (BLD OR BODY FLD EXPOSURE)

## 2011-06-07 LAB — APTT: aPTT: 31

## 2011-08-27 ENCOUNTER — Ambulatory Visit: Payer: Medicaid Other | Attending: Pain Medicine

## 2011-08-27 DIAGNOSIS — M545 Low back pain, unspecified: Secondary | ICD-10-CM | POA: Insufficient documentation

## 2011-08-27 DIAGNOSIS — G8929 Other chronic pain: Secondary | ICD-10-CM | POA: Insufficient documentation

## 2011-08-27 DIAGNOSIS — IMO0001 Reserved for inherently not codable concepts without codable children: Secondary | ICD-10-CM | POA: Insufficient documentation

## 2011-08-29 ENCOUNTER — Emergency Department (HOSPITAL_COMMUNITY)
Admission: EM | Admit: 2011-08-29 | Discharge: 2011-08-29 | Disposition: A | Discharge status: Home / Self Care | Payer: Self-pay | Attending: Emergency Medicine | Admitting: Emergency Medicine

## 2011-08-29 ENCOUNTER — Encounter (HOSPITAL_COMMUNITY): Payer: Self-pay | Admitting: Emergency Medicine

## 2011-08-29 DIAGNOSIS — S4490XA Injury of unspecified nerve at shoulder and upper arm level, unspecified arm, initial encounter: Secondary | ICD-10-CM | POA: Insufficient documentation

## 2011-08-29 DIAGNOSIS — I10 Essential (primary) hypertension: Secondary | ICD-10-CM | POA: Insufficient documentation

## 2011-08-29 DIAGNOSIS — J45909 Unspecified asthma, uncomplicated: Secondary | ICD-10-CM | POA: Insufficient documentation

## 2011-08-29 DIAGNOSIS — X58XXXA Exposure to other specified factors, initial encounter: Secondary | ICD-10-CM | POA: Insufficient documentation

## 2011-08-29 DIAGNOSIS — Z7982 Long term (current) use of aspirin: Secondary | ICD-10-CM | POA: Insufficient documentation

## 2011-08-29 DIAGNOSIS — R209 Unspecified disturbances of skin sensation: Secondary | ICD-10-CM | POA: Insufficient documentation

## 2011-08-29 DIAGNOSIS — E0500 Thyrotoxicosis with diffuse goiter without thyrotoxic crisis or storm: Secondary | ICD-10-CM | POA: Insufficient documentation

## 2011-08-29 DIAGNOSIS — I2510 Atherosclerotic heart disease of native coronary artery without angina pectoris: Secondary | ICD-10-CM | POA: Insufficient documentation

## 2011-08-29 DIAGNOSIS — M542 Cervicalgia: Secondary | ICD-10-CM | POA: Insufficient documentation

## 2011-08-29 DIAGNOSIS — Z79899 Other long term (current) drug therapy: Secondary | ICD-10-CM | POA: Insufficient documentation

## 2011-08-29 HISTORY — DX: Atherosclerotic heart disease of native coronary artery without angina pectoris: I25.10

## 2011-08-29 HISTORY — DX: Essential (primary) hypertension: I10

## 2011-08-29 HISTORY — DX: Thyrotoxicosis with diffuse goiter without thyrotoxic crisis or storm: E05.00

## 2011-08-29 NOTE — ED Provider Notes (Signed)
History     CSN: [id number]  Arrival date & time 08/29/11  1532   First MD Initiated Contact with Patient 08/29/11 1734      Chief Complaint  Patient presents with  . Numbness    (Consider location/radiation/quality/duration/timing/severity/associated sxs/prior treatment) HPI Patient presents to the emergency room with a complaint of numbness in the medial aspect of her forearm on the right. As a sharp tingling sensation and discomfort as well her arm. She initially thought she might have slept on it wrong. The pain does increase with certain movements and positions. He denies any trouble with speech, coordination, balance. Patient does have a history of chronic pain. The aorta is not associated with lumbar or cervical disc disease. She called her pain management doctor who thought she might have a pinched nerve and suggested she go to the emergency room for evaluation. Past Medical History  Diagnosis Date  . Hypertension   . Asthma   . Coronary artery disease   . Grave's disease   . Grave's disease     Past Surgical History  Procedure Date  . Abdominal hysterectomy   . Abdominal surgery   . Colon surgery     No family history on file.  History  Substance Use Topics  . Smoking status: Current Everyday Smoker -- 1.0 packs/day    Types: Cigarettes  . Smokeless tobacco: Not on file  . Alcohol Use: Yes     occasional    OB History    Grav Para Term Preterm Abortions TAB SAB Ect Mult Living                  Review of Systems  All other systems reviewed and are negative.    Allergies  Eggs or egg-derived products; Heparin; Penicillins; and Poultry meal  Home Medications   Current Outpatient Rx  Name Route Sig Dispense Refill  . ALBUTEROL SULFATE HFA 108 (90 BASE) MCG/ACT IN AERS Inhalation Inhale 2 puffs into the lungs every 6 (six) hours as needed. Shortness of breath    . ALPRAZOLAM 0.5 MG PO TABS Oral Take 0.5 mg by mouth 3 (three) times daily.    . ASPIRIN  EC 81 MG PO TBEC Oral Take 81 mg by mouth daily.    Marland Kitchen BISACODYL 5 MG PO TBEC Oral Take 5 mg by mouth daily.    Marland Kitchen CALTRATE 600+D PO Oral Take 1 tablet by mouth 2 (two) times daily.    . CHLORZOXAZONE 500 MG PO TABS Oral Take 500 mg by mouth 2 (two) times daily.    . DULOXETINE HCL 60 MG PO CPEP Oral Take 120 mg by mouth daily.    . FENOFIBRATE 160 MG PO TABS Oral Take 160 mg by mouth daily.    Marland Kitchen GABAPENTIN 800 MG PO TABS Oral Take 1,600 mg by mouth 2 (two) times daily.    . ISOSORBIDE MONONITRATE ER 60 MG PO TB24 Oral Take 30 mg by mouth daily.    Marland Kitchen KRILL OIL 300 MG PO CAPS Oral Take 300 mg by mouth daily at 12 noon.    Marland Kitchen LEVOTHYROXINE SODIUM 50 MCG PO TABS Oral Take 50 mcg by mouth daily.    Marland Kitchen LISINOPRIL-HYDROCHLOROTHIAZIDE 20-25 MG PO TABS Oral Take 1 tablet by mouth every morning.    Marland Kitchen METOPROLOL TARTRATE 25 MG PO TABS Oral Take 25 mg by mouth 2 (two) times daily.    . MORPHINE SULFATE ER 15 MG PO TB12 Oral Take 15 mg by mouth  3 (three) times daily.    Marland Kitchen NITROGLYCERIN 0.4 MG SL SUBL Sublingual Place 0.4 mg under the tongue every 5 (five) minutes as needed. Chest pain    . PRAVASTATIN SODIUM 40 MG PO TABS Oral Take 40 mg by mouth daily.    Frazier Butt BALANCE OP Ophthalmic Apply 1 drop to eye as needed. For dry eyes    . WARFARIN SODIUM 5 MG PO TABS Oral Take 5-7.5 mg by mouth daily. mon-fri 5mg ; Saturday and Sunday 7.5mg       BP 128/61  Pulse 63  Temp(Src) 98 F (36.7 C) (Oral)  Resp 18  SpO2 99%  Physical Exam  Nursing note and vitals reviewed. Constitutional: She is oriented to person, place, and time. She appears well-developed and well-nourished. No distress.  HENT:  Head: Normocephalic and atraumatic.  Right Ear: External ear normal.  Left Ear: External ear normal.  Eyes: Conjunctivae are normal. Right eye exhibits no discharge. Left eye exhibits no discharge. No scleral icterus.  Neck: Neck supple. No tracheal deviation present.       Mild cervical spine paraspinal tenderness   Cardiovascular: Normal rate.   Pulmonary/Chest: Effort normal. No stridor. No respiratory distress.  Musculoskeletal: She exhibits no edema.       5 out of 5 strength upper extremities and lower extremities, pain in neck with rotation of her head to the left. This increases the symptoms in her arm  Lymphadenopathy:    She has no cervical adenopathy.  Neurological: She is alert and oriented to person, place, and time. Cranial nerve deficit: no gross deficits. She exhibits normal muscle tone. Coordination normal.       5 out of 5 strength bilateral upper [place] grip strength, sensation light touch is intact throughout, subjective decrease medial aspect of the right forearm  Skin: Skin is warm and dry. No rash noted.  Psychiatric: She has a normal mood and affect.    ED Course  Procedures (including critical care time)  Labs Reviewed - No data to display No results found.   1. Neuropraxia of upper extremity       MDM  Symptoms are not suggestive of a stroke. Patient symptoms are suggestive of a peripheral neuropraxia. It is possible she has cervical disc disease. At this time there does not appear to be any evidence of severe weakness. There does not appear to be any evidence of an emergency medical condition. I discussed the findings with the patient. She'll continue her current medications the symptoms may resolve slowly over the next several days. If they persist she should followup with her doctor for further evaluation. I also discussed precautions for the patient to return such as worsening symptoms, trouble with balance, gait and coordination or speech.        Celene Kras, MD 08/29/11 [phone]

## 2011-08-29 NOTE — ED Notes (Signed)
Pt c/o numbness in right forearm and left forearm.  Onset this am when she woke up.  C/O pain in right upper arm.

## 2012-03-12 ENCOUNTER — Other Ambulatory Visit: Payer: Self-pay | Admitting: Cardiology

## 2012-05-15 ENCOUNTER — Other Ambulatory Visit: Payer: Medicaid Other

## 2012-05-15 ENCOUNTER — Ambulatory Visit: Payer: Self-pay | Admitting: Vascular Surgery

## 2012-05-16 ENCOUNTER — Ambulatory Visit: Payer: Self-pay | Admitting: Vascular Surgery

## 2012-05-26 ENCOUNTER — Other Ambulatory Visit: Payer: Self-pay | Admitting: *Deleted

## 2012-05-26 DIAGNOSIS — Z48812 Encounter for surgical aftercare following surgery on the circulatory system: Secondary | ICD-10-CM

## 2012-05-26 DIAGNOSIS — K551 Chronic vascular disorders of intestine: Secondary | ICD-10-CM

## 2012-05-28 ENCOUNTER — Encounter: Payer: Self-pay | Admitting: Vascular Surgery

## 2012-05-29 ENCOUNTER — Ambulatory Visit: Payer: Medicaid Other | Admitting: Vascular Surgery

## 2012-05-29 ENCOUNTER — Other Ambulatory Visit: Payer: Medicaid Other

## 2012-06-11 ENCOUNTER — Encounter: Payer: Self-pay | Admitting: Vascular Surgery

## 2012-06-12 ENCOUNTER — Other Ambulatory Visit: Payer: Medicaid Other

## 2012-06-12 ENCOUNTER — Encounter: Payer: Self-pay | Admitting: Vascular Surgery

## 2012-06-12 ENCOUNTER — Ambulatory Visit (INDEPENDENT_AMBULATORY_CARE_PROVIDER_SITE_OTHER): Payer: Medicaid Other | Admitting: Vascular Surgery

## 2012-06-12 ENCOUNTER — Encounter (INDEPENDENT_AMBULATORY_CARE_PROVIDER_SITE_OTHER): Payer: Medicaid Other | Admitting: *Deleted

## 2012-06-12 VITALS — BP 125/57 | HR 66 | Resp 16 | Ht 64.0 in | Wt 157.2 lb

## 2012-06-12 DIAGNOSIS — M79609 Pain in unspecified limb: Secondary | ICD-10-CM

## 2012-06-12 DIAGNOSIS — M79606 Pain in leg, unspecified: Secondary | ICD-10-CM

## 2012-06-12 DIAGNOSIS — K551 Chronic vascular disorders of intestine: Secondary | ICD-10-CM

## 2012-06-12 DIAGNOSIS — K55059 Acute (reversible) ischemia of intestine, part and extent unspecified: Secondary | ICD-10-CM

## 2012-06-12 NOTE — Progress Notes (Signed)
Patient is a 51 year old female who previously underwent aorta to superior mesenteric artery and celiac artery bypass in 2004. She subsequently had thrombectomy of the SMA portion of his bypass in 2008. 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 She denies any symptoms of abdominal pain. She denies food fear. She has developed what sounds like some neuropathy symptoms in her lower extremities after a recent hysterectomy for cervical cancer. She has not been losing weight. She does complain of a new mass on her left knee. This is nonpainful.    Past Surgical History  Procedure Date  . Abdominal hysterectomy   . Abdominal surgery   . Colon surgery    Aorto SMA and celiac bypass 2004 Throbectomy SMA bypass 2008  Past Medical History  Diagnosis Date  . Hypertension   . Asthma   . Coronary artery disease   . Grave's disease   . Grave's disease    History   Social History  . Marital Status: Legally Separated    Spouse Name: N/A    Number of Children: N/A  . Years of Education: N/A   Social History Main Topics  . Smoking status: Current Every Day Smoker -- 1.0 packs/day    Types: Cigarettes  . Smokeless tobacco: Never Used  . Alcohol Use: Yes     occasional  . Drug Use: No  . Sexually Active: Yes    Birth Control/ Protection: Surgical   Other Topics Concern  . None   Social History Narrative  . None    Physical exam:  Filed Vitals:   06/12/12 1013  BP: 125/57  Pulse: 66  Resp: 16  Height: 5\' 4"  (1.626 m)  Weight: 157 lb 3.2 oz (71.305 kg)  SpO2: 100%   Chest: Clear to auscultation bilaterally  Cardiac: Regular rate and rhythm without murmur  Neck: No carotid bruit  Abdomen: Well-healed midline laparotomy incision no bowel bruit  Extremities: 2+ femoral 1+ dorsalis pedis 2+ posterior tibial pulses bilaterally, 3-4 cm firm mass left popliteal fossa nonpulsatile  Data: We performed an ultrasound of her left posterior knee for the mass in that location.  This showed a 2.4 x 3.9 cm cyst with no evidence of popliteal aneurysm  Assessment: Patent mesenteric bypass with no symptoms of mesenteric ischemia currently. Left knee cyst most likely a Baker's cyst but will need followup with her primary care physician regarding this. The patient will followup with [place] in one year for repeat exam regarding her mesenteric vascular disease. The patient was counseled to quit smoking for greater than 3 minutes today.  Plan: See above  Fabienne Bruns, MD [facility] Office: [phone] Pager: [phone]

## 2013-02-17 ENCOUNTER — Other Ambulatory Visit: Payer: Self-pay | Admitting: Internal Medicine

## 2013-02-17 DIAGNOSIS — R109 Unspecified abdominal pain: Secondary | ICD-10-CM | POA: Diagnosis not present

## 2013-02-17 DIAGNOSIS — K559 Vascular disorder of intestine, unspecified: Secondary | ICD-10-CM | POA: Diagnosis not present

## 2013-02-17 DIAGNOSIS — K5900 Constipation, unspecified: Secondary | ICD-10-CM | POA: Diagnosis not present

## 2013-02-17 DIAGNOSIS — I2510 Atherosclerotic heart disease of native coronary artery without angina pectoris: Secondary | ICD-10-CM | POA: Diagnosis not present

## 2013-02-17 DIAGNOSIS — F17200 Nicotine dependence, unspecified, uncomplicated: Secondary | ICD-10-CM | POA: Diagnosis not present

## 2013-02-19 ENCOUNTER — Ambulatory Visit
Admission: RE | Admit: 2013-02-19 | Discharge: 2013-02-19 | Disposition: A | Discharge status: Home / Self Care | Payer: Medicare Other | Source: Ambulatory Visit | Attending: Internal Medicine | Admitting: Internal Medicine

## 2013-02-19 DIAGNOSIS — C549 Malignant neoplasm of corpus uteri, unspecified: Secondary | ICD-10-CM | POA: Diagnosis not present

## 2013-02-19 DIAGNOSIS — I10 Essential (primary) hypertension: Secondary | ICD-10-CM | POA: Diagnosis not present

## 2013-02-19 DIAGNOSIS — R109 Unspecified abdominal pain: Secondary | ICD-10-CM

## 2013-02-19 DIAGNOSIS — R87612 Low grade squamous intraepithelial lesion on cytologic smear of cervix (LGSIL): Secondary | ICD-10-CM | POA: Diagnosis not present

## 2013-02-19 DIAGNOSIS — I2510 Atherosclerotic heart disease of native coronary artery without angina pectoris: Secondary | ICD-10-CM | POA: Diagnosis not present

## 2013-02-19 DIAGNOSIS — I898 Other specified noninfective disorders of lymphatic vessels and lymph nodes: Secondary | ICD-10-CM | POA: Diagnosis not present

## 2013-02-19 DIAGNOSIS — K7689 Other specified diseases of liver: Secondary | ICD-10-CM | POA: Diagnosis not present

## 2013-02-19 DIAGNOSIS — F411 Generalized anxiety disorder: Secondary | ICD-10-CM | POA: Diagnosis not present

## 2013-02-19 DIAGNOSIS — F329 Major depressive disorder, single episode, unspecified: Secondary | ICD-10-CM | POA: Diagnosis not present

## 2013-02-19 MED ORDER — IOHEXOL 300 MG/ML  SOLN
100.0000 mL | Freq: Once | INTRAMUSCULAR | Status: AC | PRN
  Administered 2013-02-19: 100 mL via INTRAVENOUS

## 2013-02-23 DIAGNOSIS — D72829 Elevated white blood cell count, unspecified: Secondary | ICD-10-CM | POA: Diagnosis not present

## 2013-02-23 DIAGNOSIS — C55 Malignant neoplasm of uterus, part unspecified: Secondary | ICD-10-CM | POA: Diagnosis not present

## 2013-02-23 DIAGNOSIS — R109 Unspecified abdominal pain: Secondary | ICD-10-CM | POA: Diagnosis not present

## 2013-02-27 DIAGNOSIS — Z880 Allergy status to penicillin: Secondary | ICD-10-CM | POA: Diagnosis not present

## 2013-02-27 DIAGNOSIS — IMO0002 Reserved for concepts with insufficient information to code with codable children: Secondary | ICD-10-CM | POA: Diagnosis not present

## 2013-02-27 DIAGNOSIS — Z888 Allergy status to other drugs, medicaments and biological substances status: Secondary | ICD-10-CM | POA: Diagnosis not present

## 2013-02-27 DIAGNOSIS — Z79899 Other long term (current) drug therapy: Secondary | ICD-10-CM | POA: Diagnosis not present

## 2013-02-27 DIAGNOSIS — F17200 Nicotine dependence, unspecified, uncomplicated: Secondary | ICD-10-CM | POA: Diagnosis not present

## 2013-02-27 DIAGNOSIS — G894 Chronic pain syndrome: Secondary | ICD-10-CM | POA: Diagnosis not present

## 2013-02-27 DIAGNOSIS — Z91012 Allergy to eggs: Secondary | ICD-10-CM | POA: Diagnosis not present

## 2013-02-27 DIAGNOSIS — M533 Sacrococcygeal disorders, not elsewhere classified: Secondary | ICD-10-CM | POA: Diagnosis not present

## 2013-02-27 DIAGNOSIS — Z4789 Encounter for other orthopedic aftercare: Secondary | ICD-10-CM | POA: Diagnosis not present

## 2013-03-03 DIAGNOSIS — D72829 Elevated white blood cell count, unspecified: Secondary | ICD-10-CM | POA: Diagnosis not present

## 2013-03-05 DIAGNOSIS — I2510 Atherosclerotic heart disease of native coronary artery without angina pectoris: Secondary | ICD-10-CM | POA: Diagnosis not present

## 2013-03-05 DIAGNOSIS — I898 Other specified noninfective disorders of lymphatic vessels and lymph nodes: Secondary | ICD-10-CM | POA: Diagnosis not present

## 2013-03-05 DIAGNOSIS — C549 Malignant neoplasm of corpus uteri, unspecified: Secondary | ICD-10-CM | POA: Diagnosis not present

## 2013-03-05 DIAGNOSIS — F329 Major depressive disorder, single episode, unspecified: Secondary | ICD-10-CM | POA: Diagnosis not present

## 2013-03-05 DIAGNOSIS — R87612 Low grade squamous intraepithelial lesion on cytologic smear of cervix (LGSIL): Secondary | ICD-10-CM | POA: Diagnosis not present

## 2013-03-05 DIAGNOSIS — F411 Generalized anxiety disorder: Secondary | ICD-10-CM | POA: Diagnosis not present

## 2013-03-05 DIAGNOSIS — I10 Essential (primary) hypertension: Secondary | ICD-10-CM | POA: Diagnosis not present

## 2013-03-06 ENCOUNTER — Other Ambulatory Visit (HOSPITAL_COMMUNITY): Payer: Self-pay | Admitting: Obstetrics and Gynecology

## 2013-03-06 DIAGNOSIS — C541 Malignant neoplasm of endometrium: Secondary | ICD-10-CM

## 2013-03-12 ENCOUNTER — Encounter (HOSPITAL_COMMUNITY)
Admission: RE | Admit: 2013-03-12 | Discharge: 2013-03-12 | Disposition: A | Discharge status: Home / Self Care | Payer: Medicare Other | Source: Ambulatory Visit | Attending: Obstetrics and Gynecology | Admitting: Obstetrics and Gynecology

## 2013-03-12 DIAGNOSIS — C549 Malignant neoplasm of corpus uteri, unspecified: Secondary | ICD-10-CM | POA: Insufficient documentation

## 2013-03-12 DIAGNOSIS — C55 Malignant neoplasm of uterus, part unspecified: Secondary | ICD-10-CM | POA: Diagnosis not present

## 2013-03-12 DIAGNOSIS — C541 Malignant neoplasm of endometrium: Secondary | ICD-10-CM

## 2013-03-12 LAB — GLUCOSE, CAPILLARY: Glucose-Capillary: 90 mg/dL (ref 70–99)

## 2013-03-12 MED ORDER — FLUDEOXYGLUCOSE F - 18 (FDG) INJECTION
20.0000 | Freq: Once | INTRAVENOUS | Status: AC | PRN
  Administered 2013-03-12: 20 via INTRAVENOUS

## 2013-03-16 DIAGNOSIS — R922 Inconclusive mammogram: Secondary | ICD-10-CM | POA: Diagnosis not present

## 2013-03-16 DIAGNOSIS — Z1231 Encounter for screening mammogram for malignant neoplasm of breast: Secondary | ICD-10-CM | POA: Diagnosis not present

## 2013-03-19 ENCOUNTER — Other Ambulatory Visit (HOSPITAL_COMMUNITY): Payer: Self-pay | Admitting: Obstetrics and Gynecology

## 2013-03-19 DIAGNOSIS — I2510 Atherosclerotic heart disease of native coronary artery without angina pectoris: Secondary | ICD-10-CM | POA: Diagnosis not present

## 2013-03-19 DIAGNOSIS — F411 Generalized anxiety disorder: Secondary | ICD-10-CM | POA: Diagnosis not present

## 2013-03-19 DIAGNOSIS — I10 Essential (primary) hypertension: Secondary | ICD-10-CM | POA: Diagnosis not present

## 2013-03-19 DIAGNOSIS — I898 Other specified noninfective disorders of lymphatic vessels and lymph nodes: Secondary | ICD-10-CM | POA: Diagnosis not present

## 2013-03-19 DIAGNOSIS — R1900 Intra-abdominal and pelvic swelling, mass and lump, unspecified site: Secondary | ICD-10-CM

## 2013-03-19 DIAGNOSIS — C549 Malignant neoplasm of corpus uteri, unspecified: Secondary | ICD-10-CM | POA: Diagnosis not present

## 2013-03-19 DIAGNOSIS — F329 Major depressive disorder, single episode, unspecified: Secondary | ICD-10-CM | POA: Diagnosis not present

## 2013-03-19 DIAGNOSIS — R87612 Low grade squamous intraepithelial lesion on cytologic smear of cervix (LGSIL): Secondary | ICD-10-CM | POA: Diagnosis not present

## 2013-03-26 DIAGNOSIS — G894 Chronic pain syndrome: Secondary | ICD-10-CM | POA: Diagnosis not present

## 2013-03-26 DIAGNOSIS — Z7901 Long term (current) use of anticoagulants: Secondary | ICD-10-CM | POA: Diagnosis not present

## 2013-03-26 DIAGNOSIS — C549 Malignant neoplasm of corpus uteri, unspecified: Secondary | ICD-10-CM | POA: Diagnosis not present

## 2013-03-26 DIAGNOSIS — R110 Nausea: Secondary | ICD-10-CM | POA: Diagnosis not present

## 2013-03-26 DIAGNOSIS — Z79899 Other long term (current) drug therapy: Secondary | ICD-10-CM | POA: Diagnosis not present

## 2013-04-03 DIAGNOSIS — G894 Chronic pain syndrome: Secondary | ICD-10-CM | POA: Diagnosis not present

## 2013-04-03 DIAGNOSIS — M545 Low back pain, unspecified: Secondary | ICD-10-CM | POA: Diagnosis not present

## 2013-04-03 DIAGNOSIS — N949 Unspecified condition associated with female genital organs and menstrual cycle: Secondary | ICD-10-CM | POA: Diagnosis not present

## 2013-04-09 ENCOUNTER — Other Ambulatory Visit: Payer: Self-pay | Admitting: Pain Medicine

## 2013-04-09 DIAGNOSIS — Z5181 Encounter for therapeutic drug level monitoring: Secondary | ICD-10-CM | POA: Diagnosis not present

## 2013-04-09 DIAGNOSIS — M545 Low back pain, unspecified: Secondary | ICD-10-CM

## 2013-04-09 DIAGNOSIS — D689 Coagulation defect, unspecified: Secondary | ICD-10-CM | POA: Diagnosis not present

## 2013-04-09 DIAGNOSIS — Z7901 Long term (current) use of anticoagulants: Secondary | ICD-10-CM | POA: Diagnosis not present

## 2013-04-16 ENCOUNTER — Encounter (HOSPITAL_COMMUNITY)
Admission: RE | Admit: 2013-04-16 | Discharge: 2013-04-16 | Disposition: A | Discharge status: Home / Self Care | Payer: Medicare Other | Source: Ambulatory Visit | Attending: Obstetrics and Gynecology | Admitting: Obstetrics and Gynecology

## 2013-04-16 DIAGNOSIS — C539 Malignant neoplasm of cervix uteri, unspecified: Secondary | ICD-10-CM | POA: Insufficient documentation

## 2013-04-16 DIAGNOSIS — I2510 Atherosclerotic heart disease of native coronary artery without angina pectoris: Secondary | ICD-10-CM | POA: Diagnosis not present

## 2013-04-16 DIAGNOSIS — R1900 Intra-abdominal and pelvic swelling, mass and lump, unspecified site: Secondary | ICD-10-CM

## 2013-04-16 DIAGNOSIS — C760 Malignant neoplasm of head, face and neck: Secondary | ICD-10-CM | POA: Diagnosis not present

## 2013-04-16 DIAGNOSIS — R1909 Other intra-abdominal and pelvic swelling, mass and lump: Secondary | ICD-10-CM | POA: Insufficient documentation

## 2013-04-16 MED ORDER — FLUDEOXYGLUCOSE F - 18 (FDG) INJECTION
19.0000 | Freq: Once | INTRAVENOUS | Status: AC | PRN
  Administered 2013-04-16: 19 via INTRAVENOUS

## 2013-04-17 ENCOUNTER — Ambulatory Visit
Admission: RE | Admit: 2013-04-17 | Discharge: 2013-04-17 | Disposition: A | Discharge status: Home / Self Care | Payer: Medicare Other | Source: Ambulatory Visit | Attending: Pain Medicine | Admitting: Pain Medicine

## 2013-04-17 DIAGNOSIS — M545 Low back pain, unspecified: Secondary | ICD-10-CM

## 2013-04-17 DIAGNOSIS — Z5181 Encounter for therapeutic drug level monitoring: Secondary | ICD-10-CM | POA: Diagnosis not present

## 2013-04-17 DIAGNOSIS — D689 Coagulation defect, unspecified: Secondary | ICD-10-CM | POA: Diagnosis not present

## 2013-04-17 DIAGNOSIS — Z7901 Long term (current) use of anticoagulants: Secondary | ICD-10-CM | POA: Diagnosis not present

## 2013-04-17 DIAGNOSIS — M47817 Spondylosis without myelopathy or radiculopathy, lumbosacral region: Secondary | ICD-10-CM | POA: Diagnosis not present

## 2013-04-23 DIAGNOSIS — Z7901 Long term (current) use of anticoagulants: Secondary | ICD-10-CM | POA: Diagnosis not present

## 2013-04-23 DIAGNOSIS — C549 Malignant neoplasm of corpus uteri, unspecified: Secondary | ICD-10-CM | POA: Diagnosis not present

## 2013-04-23 DIAGNOSIS — D689 Coagulation defect, unspecified: Secondary | ICD-10-CM | POA: Diagnosis not present

## 2013-04-23 DIAGNOSIS — Z5181 Encounter for therapeutic drug level monitoring: Secondary | ICD-10-CM | POA: Diagnosis not present

## 2013-04-23 DIAGNOSIS — R1900 Intra-abdominal and pelvic swelling, mass and lump, unspecified site: Secondary | ICD-10-CM | POA: Diagnosis not present

## 2013-05-15 DIAGNOSIS — G894 Chronic pain syndrome: Secondary | ICD-10-CM | POA: Diagnosis not present

## 2013-05-15 DIAGNOSIS — M545 Low back pain, unspecified: Secondary | ICD-10-CM | POA: Diagnosis not present

## 2013-05-18 DIAGNOSIS — D689 Coagulation defect, unspecified: Secondary | ICD-10-CM | POA: Diagnosis not present

## 2013-05-18 DIAGNOSIS — Z7901 Long term (current) use of anticoagulants: Secondary | ICD-10-CM | POA: Diagnosis not present

## 2013-05-21 DIAGNOSIS — Z7901 Long term (current) use of anticoagulants: Secondary | ICD-10-CM | POA: Diagnosis not present

## 2013-05-21 DIAGNOSIS — D7582 Heparin induced thrombocytopenia (HIT): Secondary | ICD-10-CM | POA: Diagnosis not present

## 2013-05-21 DIAGNOSIS — C55 Malignant neoplasm of uterus, part unspecified: Secondary | ICD-10-CM | POA: Diagnosis not present

## 2013-05-21 DIAGNOSIS — C549 Malignant neoplasm of corpus uteri, unspecified: Secondary | ICD-10-CM | POA: Diagnosis not present

## 2013-05-21 DIAGNOSIS — D6859 Other primary thrombophilia: Secondary | ICD-10-CM | POA: Diagnosis not present

## 2013-06-05 ENCOUNTER — Other Ambulatory Visit: Payer: Self-pay | Admitting: Internal Medicine

## 2013-06-05 DIAGNOSIS — R112 Nausea with vomiting, unspecified: Secondary | ICD-10-CM | POA: Diagnosis not present

## 2013-06-05 DIAGNOSIS — R109 Unspecified abdominal pain: Secondary | ICD-10-CM

## 2013-06-09 ENCOUNTER — Ambulatory Visit
Admission: RE | Admit: 2013-06-09 | Discharge: 2013-06-09 | Disposition: A | Discharge status: Home / Self Care | Payer: Medicare Other | Source: Ambulatory Visit | Attending: Internal Medicine | Admitting: Internal Medicine

## 2013-06-09 DIAGNOSIS — R109 Unspecified abdominal pain: Secondary | ICD-10-CM

## 2013-06-09 DIAGNOSIS — K7689 Other specified diseases of liver: Secondary | ICD-10-CM | POA: Diagnosis not present

## 2013-06-09 MED ORDER — IOHEXOL 300 MG/ML  SOLN
100.0000 mL | Freq: Once | INTRAMUSCULAR | Status: AC | PRN
  Administered 2013-06-09: 100 mL via INTRAVENOUS

## 2013-06-10 ENCOUNTER — Encounter: Payer: Self-pay | Admitting: Vascular Surgery

## 2013-06-11 ENCOUNTER — Encounter: Payer: Self-pay | Admitting: Vascular Surgery

## 2013-06-11 ENCOUNTER — Encounter (INDEPENDENT_AMBULATORY_CARE_PROVIDER_SITE_OTHER): Payer: Self-pay

## 2013-06-11 ENCOUNTER — Ambulatory Visit (INDEPENDENT_AMBULATORY_CARE_PROVIDER_SITE_OTHER): Payer: Medicare Other | Admitting: Vascular Surgery

## 2013-06-11 DIAGNOSIS — M79609 Pain in unspecified limb: Secondary | ICD-10-CM | POA: Diagnosis not present

## 2013-06-11 DIAGNOSIS — Z48812 Encounter for surgical aftercare following surgery on the circulatory system: Secondary | ICD-10-CM | POA: Diagnosis not present

## 2013-06-11 DIAGNOSIS — K551 Chronic vascular disorders of intestine: Secondary | ICD-10-CM

## 2013-06-11 NOTE — Progress Notes (Signed)
Patient is a 52 year old female who previously underwent aorta to superior mesenteric artery and celiac artery bypass in 2004. She subsequently had thrombectomy of the SMA portion of his bypass in 2008. 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 She denies any symptoms of abdominal pain. She denies food fear. She has developed what sounds like some neuropathy symptoms in her lower extremities after a recent hysterectomy for cervical cancer. She has not been losing weight. She was diagnosed with a left Bakers cyst a year ago. She states she still has some occasional pain from this. It is not really bothersome. Unfortunately she continues to smoke. Greater than 3 minutes they were spent regarding smoking cessation counseling.      Past Surgical History   Procedure  Date   .  Abdominal hysterectomy     .  Abdominal surgery     .  Colon surgery      Aorto SMA and celiac bypass 2004 Throbectomy SMA bypass 2008    Past Medical History   Diagnosis  Date   .  Hypertension     .  Asthma     .  Coronary artery disease     .  Grave's disease     .  Grave's disease        History      Social History   .  Marital Status:  Legally Separated       Spouse Name:  N/A       Number of Children:  N/A   .  Years of Education:  N/A      Social History Main Topics   .  Smoking status:  Current Every Day Smoker -- 1.0 packs/day       Types:  Cigarettes   .  Smokeless tobacco:  Never Used   .  Alcohol Use:  Yes         occasional   .  Drug Use:  No   .  Sexually Active:  Yes       Birth Control/ Protection:  Surgical      Other Topics  Concern   .  None      Social History Narrative   .  None     Physical exam:    Filed Vitals:   06/11/13 1202  BP: 134/64  Pulse: 68  Height: 5\' 4"  (1.626 m)  Weight: 154 lb 9.6 oz (70.126 kg)  SpO2: 98%    Chest: Clear to auscultation bilaterally  Cardiac: Regular rate and rhythm without murmur  Neck: No carotid bruit  Abdomen: Well-healed  midline laparotomy incision no abdominal bruit  Extremities: 2+ femoral 1+ dorsalis pedis 2+ posterior tibial pulses bilaterally, 3-4 cm firm mass left popliteal fossa nonpulsatile, some fullness in the right sartorius muscle question right-sided cyst  Data: Recent CT scan of abdomen and pelvis was reviewed. This shows a patent SMA bypass graft originating from the supraceliac aorta. The celiac limb is chronically occluded. There is good filling of the distal mesenteric vessels. CT is dated October 13.  Assessment: Patent mesenteric bypass with no symptoms of mesenteric ischemia currently. Left knee cyst most likely a Baker's cyst mildly symptomatic. The patient will followup with [place] in one year for repeat exam regarding her mesenteric vascular disease. She will have a mesenteric duplex at that time. The patient was counseled to quit smoking for greater than 3 minutes today.  Plan: See above  Fabienne Bruns, MD [facility] Office:  [phone] Pager: [phone]

## 2013-06-15 DIAGNOSIS — D689 Coagulation defect, unspecified: Secondary | ICD-10-CM | POA: Diagnosis not present

## 2013-06-15 DIAGNOSIS — Z5181 Encounter for therapeutic drug level monitoring: Secondary | ICD-10-CM | POA: Diagnosis not present

## 2013-06-15 DIAGNOSIS — Z7901 Long term (current) use of anticoagulants: Secondary | ICD-10-CM | POA: Diagnosis not present

## 2013-07-07 DIAGNOSIS — S3994XA Unspecified injury of external genitals, initial encounter: Secondary | ICD-10-CM | POA: Diagnosis not present

## 2013-07-07 DIAGNOSIS — N952 Postmenopausal atrophic vaginitis: Secondary | ICD-10-CM | POA: Diagnosis not present

## 2013-07-07 DIAGNOSIS — R300 Dysuria: Secondary | ICD-10-CM | POA: Diagnosis not present

## 2013-07-07 DIAGNOSIS — Z880 Allergy status to penicillin: Secondary | ICD-10-CM | POA: Diagnosis not present

## 2013-07-07 DIAGNOSIS — Z79899 Other long term (current) drug therapy: Secondary | ICD-10-CM | POA: Diagnosis not present

## 2013-07-07 DIAGNOSIS — Z8542 Personal history of malignant neoplasm of other parts of uterus: Secondary | ICD-10-CM | POA: Diagnosis not present

## 2013-07-07 DIAGNOSIS — N930 Postcoital and contact bleeding: Secondary | ICD-10-CM | POA: Diagnosis not present

## 2013-07-07 DIAGNOSIS — Z888 Allergy status to other drugs, medicaments and biological substances status: Secondary | ICD-10-CM | POA: Diagnosis not present

## 2013-07-07 DIAGNOSIS — Z7901 Long term (current) use of anticoagulants: Secondary | ICD-10-CM | POA: Diagnosis not present

## 2013-07-07 DIAGNOSIS — Z91018 Allergy to other foods: Secondary | ICD-10-CM | POA: Diagnosis not present

## 2013-07-10 DIAGNOSIS — Z5181 Encounter for therapeutic drug level monitoring: Secondary | ICD-10-CM | POA: Diagnosis not present

## 2013-07-10 DIAGNOSIS — N949 Unspecified condition associated with female genital organs and menstrual cycle: Secondary | ICD-10-CM | POA: Diagnosis not present

## 2013-07-10 DIAGNOSIS — N94819 Vulvodynia, unspecified: Secondary | ICD-10-CM | POA: Diagnosis not present

## 2013-07-10 DIAGNOSIS — M47817 Spondylosis without myelopathy or radiculopathy, lumbosacral region: Secondary | ICD-10-CM | POA: Diagnosis not present

## 2013-07-10 DIAGNOSIS — M545 Low back pain, unspecified: Secondary | ICD-10-CM | POA: Diagnosis not present

## 2013-07-10 DIAGNOSIS — Z79899 Other long term (current) drug therapy: Secondary | ICD-10-CM | POA: Diagnosis not present

## 2013-07-13 DIAGNOSIS — N930 Postcoital and contact bleeding: Secondary | ICD-10-CM | POA: Diagnosis not present

## 2013-07-13 DIAGNOSIS — R300 Dysuria: Secondary | ICD-10-CM | POA: Diagnosis not present

## 2013-07-13 DIAGNOSIS — Z7901 Long term (current) use of anticoagulants: Secondary | ICD-10-CM | POA: Diagnosis not present

## 2013-07-13 DIAGNOSIS — S3994XA Unspecified injury of external genitals, initial encounter: Secondary | ICD-10-CM | POA: Diagnosis not present

## 2013-07-13 DIAGNOSIS — Z8542 Personal history of malignant neoplasm of other parts of uterus: Secondary | ICD-10-CM | POA: Diagnosis not present

## 2013-07-13 DIAGNOSIS — Z79899 Other long term (current) drug therapy: Secondary | ICD-10-CM | POA: Diagnosis not present

## 2013-07-27 DIAGNOSIS — D689 Coagulation defect, unspecified: Secondary | ICD-10-CM | POA: Diagnosis not present

## 2013-07-27 DIAGNOSIS — Z7901 Long term (current) use of anticoagulants: Secondary | ICD-10-CM | POA: Diagnosis not present

## 2013-07-27 DIAGNOSIS — Z5181 Encounter for therapeutic drug level monitoring: Secondary | ICD-10-CM | POA: Diagnosis not present

## 2013-07-30 DIAGNOSIS — J45909 Unspecified asthma, uncomplicated: Secondary | ICD-10-CM | POA: Diagnosis not present

## 2013-07-30 DIAGNOSIS — Z8679 Personal history of other diseases of the circulatory system: Secondary | ICD-10-CM | POA: Diagnosis not present

## 2013-07-30 DIAGNOSIS — F341 Dysthymic disorder: Secondary | ICD-10-CM | POA: Diagnosis not present

## 2013-07-30 DIAGNOSIS — Z8542 Personal history of malignant neoplasm of other parts of uterus: Secondary | ICD-10-CM | POA: Diagnosis not present

## 2013-07-30 DIAGNOSIS — E0500 Thyrotoxicosis with diffuse goiter without thyrotoxic crisis or storm: Secondary | ICD-10-CM | POA: Diagnosis not present

## 2013-07-30 DIAGNOSIS — M47817 Spondylosis without myelopathy or radiculopathy, lumbosacral region: Secondary | ICD-10-CM | POA: Diagnosis not present

## 2013-07-30 DIAGNOSIS — E785 Hyperlipidemia, unspecified: Secondary | ICD-10-CM | POA: Diagnosis not present

## 2013-07-30 DIAGNOSIS — Z7901 Long term (current) use of anticoagulants: Secondary | ICD-10-CM | POA: Diagnosis not present

## 2013-07-30 DIAGNOSIS — I10 Essential (primary) hypertension: Secondary | ICD-10-CM | POA: Diagnosis not present

## 2013-07-30 DIAGNOSIS — I2510 Atherosclerotic heart disease of native coronary artery without angina pectoris: Secondary | ICD-10-CM | POA: Diagnosis not present

## 2013-07-30 DIAGNOSIS — Z79899 Other long term (current) drug therapy: Secondary | ICD-10-CM | POA: Diagnosis not present

## 2013-07-30 DIAGNOSIS — F17200 Nicotine dependence, unspecified, uncomplicated: Secondary | ICD-10-CM | POA: Diagnosis not present

## 2013-07-30 DIAGNOSIS — G894 Chronic pain syndrome: Secondary | ICD-10-CM | POA: Diagnosis not present

## 2013-08-10 DIAGNOSIS — Z5181 Encounter for therapeutic drug level monitoring: Secondary | ICD-10-CM | POA: Diagnosis not present

## 2013-08-10 DIAGNOSIS — D689 Coagulation defect, unspecified: Secondary | ICD-10-CM | POA: Diagnosis not present

## 2013-08-10 DIAGNOSIS — Z7901 Long term (current) use of anticoagulants: Secondary | ICD-10-CM | POA: Diagnosis not present

## 2013-08-27 DIAGNOSIS — Z7901 Long term (current) use of anticoagulants: Secondary | ICD-10-CM | POA: Diagnosis not present

## 2013-08-27 DIAGNOSIS — E785 Hyperlipidemia, unspecified: Secondary | ICD-10-CM | POA: Diagnosis not present

## 2013-08-27 DIAGNOSIS — G894 Chronic pain syndrome: Secondary | ICD-10-CM | POA: Diagnosis not present

## 2013-08-27 DIAGNOSIS — E0500 Thyrotoxicosis with diffuse goiter without thyrotoxic crisis or storm: Secondary | ICD-10-CM | POA: Diagnosis not present

## 2013-08-27 DIAGNOSIS — F411 Generalized anxiety disorder: Secondary | ICD-10-CM | POA: Diagnosis not present

## 2013-08-27 DIAGNOSIS — F17200 Nicotine dependence, unspecified, uncomplicated: Secondary | ICD-10-CM | POA: Diagnosis not present

## 2013-08-27 DIAGNOSIS — J45909 Unspecified asthma, uncomplicated: Secondary | ICD-10-CM | POA: Diagnosis not present

## 2013-08-27 DIAGNOSIS — I2510 Atherosclerotic heart disease of native coronary artery without angina pectoris: Secondary | ICD-10-CM | POA: Diagnosis not present

## 2013-08-27 DIAGNOSIS — Z888 Allergy status to other drugs, medicaments and biological substances status: Secondary | ICD-10-CM | POA: Diagnosis not present

## 2013-08-27 DIAGNOSIS — F3289 Other specified depressive episodes: Secondary | ICD-10-CM | POA: Diagnosis not present

## 2013-08-27 DIAGNOSIS — M545 Low back pain, unspecified: Secondary | ICD-10-CM | POA: Diagnosis not present

## 2013-08-27 DIAGNOSIS — F329 Major depressive disorder, single episode, unspecified: Secondary | ICD-10-CM | POA: Diagnosis not present

## 2013-08-27 DIAGNOSIS — I10 Essential (primary) hypertension: Secondary | ICD-10-CM | POA: Diagnosis not present

## 2013-08-27 DIAGNOSIS — Z79899 Other long term (current) drug therapy: Secondary | ICD-10-CM | POA: Diagnosis not present

## 2013-08-27 DIAGNOSIS — M47817 Spondylosis without myelopathy or radiculopathy, lumbosacral region: Secondary | ICD-10-CM | POA: Diagnosis not present

## 2013-09-04 DIAGNOSIS — M47817 Spondylosis without myelopathy or radiculopathy, lumbosacral region: Secondary | ICD-10-CM | POA: Diagnosis not present

## 2013-09-04 DIAGNOSIS — M545 Low back pain, unspecified: Secondary | ICD-10-CM | POA: Diagnosis not present

## 2013-09-04 DIAGNOSIS — G894 Chronic pain syndrome: Secondary | ICD-10-CM | POA: Diagnosis not present

## 2013-09-04 DIAGNOSIS — N949 Unspecified condition associated with female genital organs and menstrual cycle: Secondary | ICD-10-CM | POA: Diagnosis not present

## 2013-09-07 DIAGNOSIS — Z7901 Long term (current) use of anticoagulants: Secondary | ICD-10-CM | POA: Diagnosis not present

## 2013-09-07 DIAGNOSIS — Z5181 Encounter for therapeutic drug level monitoring: Secondary | ICD-10-CM | POA: Diagnosis not present

## 2013-09-07 DIAGNOSIS — D6859 Other primary thrombophilia: Secondary | ICD-10-CM | POA: Diagnosis not present

## 2013-09-07 DIAGNOSIS — K551 Chronic vascular disorders of intestine: Secondary | ICD-10-CM | POA: Diagnosis not present

## 2013-09-14 DIAGNOSIS — D6859 Other primary thrombophilia: Secondary | ICD-10-CM | POA: Diagnosis not present

## 2013-09-14 DIAGNOSIS — Z7901 Long term (current) use of anticoagulants: Secondary | ICD-10-CM | POA: Diagnosis not present

## 2013-09-14 DIAGNOSIS — F321 Major depressive disorder, single episode, moderate: Secondary | ICD-10-CM | POA: Diagnosis not present

## 2013-09-14 DIAGNOSIS — Z5181 Encounter for therapeutic drug level monitoring: Secondary | ICD-10-CM | POA: Diagnosis not present

## 2013-09-14 DIAGNOSIS — K551 Chronic vascular disorders of intestine: Secondary | ICD-10-CM | POA: Diagnosis not present

## 2013-09-21 DIAGNOSIS — F321 Major depressive disorder, single episode, moderate: Secondary | ICD-10-CM | POA: Diagnosis not present

## 2013-09-22 DIAGNOSIS — D6859 Other primary thrombophilia: Secondary | ICD-10-CM | POA: Diagnosis not present

## 2013-09-22 DIAGNOSIS — Z5181 Encounter for therapeutic drug level monitoring: Secondary | ICD-10-CM | POA: Diagnosis not present

## 2013-09-22 DIAGNOSIS — Z7901 Long term (current) use of anticoagulants: Secondary | ICD-10-CM | POA: Diagnosis not present

## 2013-09-22 DIAGNOSIS — K551 Chronic vascular disorders of intestine: Secondary | ICD-10-CM | POA: Diagnosis not present

## 2013-09-29 DIAGNOSIS — Z7901 Long term (current) use of anticoagulants: Secondary | ICD-10-CM | POA: Diagnosis not present

## 2013-09-29 DIAGNOSIS — Z5181 Encounter for therapeutic drug level monitoring: Secondary | ICD-10-CM | POA: Diagnosis not present

## 2013-09-29 DIAGNOSIS — K551 Chronic vascular disorders of intestine: Secondary | ICD-10-CM | POA: Diagnosis not present

## 2013-09-29 DIAGNOSIS — D6859 Other primary thrombophilia: Secondary | ICD-10-CM | POA: Diagnosis not present

## 2013-10-02 DIAGNOSIS — M545 Low back pain, unspecified: Secondary | ICD-10-CM | POA: Diagnosis not present

## 2013-10-02 DIAGNOSIS — G894 Chronic pain syndrome: Secondary | ICD-10-CM | POA: Diagnosis not present

## 2013-10-02 DIAGNOSIS — N949 Unspecified condition associated with female genital organs and menstrual cycle: Secondary | ICD-10-CM | POA: Diagnosis not present

## 2013-10-02 DIAGNOSIS — M47817 Spondylosis without myelopathy or radiculopathy, lumbosacral region: Secondary | ICD-10-CM | POA: Diagnosis not present

## 2013-10-07 DIAGNOSIS — Z5181 Encounter for therapeutic drug level monitoring: Secondary | ICD-10-CM | POA: Diagnosis not present

## 2013-10-07 DIAGNOSIS — D6859 Other primary thrombophilia: Secondary | ICD-10-CM | POA: Diagnosis not present

## 2013-10-07 DIAGNOSIS — Z7901 Long term (current) use of anticoagulants: Secondary | ICD-10-CM | POA: Diagnosis not present

## 2013-10-07 DIAGNOSIS — K551 Chronic vascular disorders of intestine: Secondary | ICD-10-CM | POA: Diagnosis not present

## 2013-10-14 DIAGNOSIS — D6859 Other primary thrombophilia: Secondary | ICD-10-CM | POA: Diagnosis not present

## 2013-10-14 DIAGNOSIS — Z5181 Encounter for therapeutic drug level monitoring: Secondary | ICD-10-CM | POA: Diagnosis not present

## 2013-10-14 DIAGNOSIS — Z7901 Long term (current) use of anticoagulants: Secondary | ICD-10-CM | POA: Diagnosis not present

## 2013-10-14 DIAGNOSIS — K551 Chronic vascular disorders of intestine: Secondary | ICD-10-CM | POA: Diagnosis not present

## 2013-10-22 DIAGNOSIS — F329 Major depressive disorder, single episode, unspecified: Secondary | ICD-10-CM | POA: Diagnosis not present

## 2013-10-22 DIAGNOSIS — Z90710 Acquired absence of both cervix and uterus: Secondary | ICD-10-CM | POA: Diagnosis not present

## 2013-10-22 DIAGNOSIS — Z8542 Personal history of malignant neoplasm of other parts of uterus: Secondary | ICD-10-CM | POA: Diagnosis not present

## 2013-10-22 DIAGNOSIS — C549 Malignant neoplasm of corpus uteri, unspecified: Secondary | ICD-10-CM | POA: Diagnosis not present

## 2013-10-22 DIAGNOSIS — I2510 Atherosclerotic heart disease of native coronary artery without angina pectoris: Secondary | ICD-10-CM | POA: Diagnosis not present

## 2013-10-22 DIAGNOSIS — E0500 Thyrotoxicosis with diffuse goiter without thyrotoxic crisis or storm: Secondary | ICD-10-CM | POA: Diagnosis not present

## 2013-10-22 DIAGNOSIS — F3289 Other specified depressive episodes: Secondary | ICD-10-CM | POA: Diagnosis not present

## 2013-10-22 DIAGNOSIS — G4700 Insomnia, unspecified: Secondary | ICD-10-CM | POA: Diagnosis not present

## 2013-10-22 DIAGNOSIS — I10 Essential (primary) hypertension: Secondary | ICD-10-CM | POA: Diagnosis not present

## 2013-10-22 DIAGNOSIS — J45909 Unspecified asthma, uncomplicated: Secondary | ICD-10-CM | POA: Diagnosis not present

## 2013-10-22 DIAGNOSIS — F411 Generalized anxiety disorder: Secondary | ICD-10-CM | POA: Diagnosis not present

## 2013-10-22 DIAGNOSIS — Z951 Presence of aortocoronary bypass graft: Secondary | ICD-10-CM | POA: Diagnosis not present

## 2013-10-22 DIAGNOSIS — Z9079 Acquired absence of other genital organ(s): Secondary | ICD-10-CM | POA: Diagnosis not present

## 2013-10-22 DIAGNOSIS — D68318 Other hemorrhagic disorder due to intrinsic circulating anticoagulants, antibodies, or inhibitors: Secondary | ICD-10-CM | POA: Diagnosis not present

## 2013-10-22 DIAGNOSIS — R109 Unspecified abdominal pain: Secondary | ICD-10-CM | POA: Diagnosis not present

## 2013-10-25 ENCOUNTER — Other Ambulatory Visit: Payer: Self-pay

## 2013-10-25 ENCOUNTER — Encounter (HOSPITAL_COMMUNITY): Payer: Self-pay | Admitting: Emergency Medicine

## 2013-10-25 ENCOUNTER — Inpatient Hospital Stay (HOSPITAL_COMMUNITY)
Admission: EM | Admit: 2013-10-25 | Discharge: 2013-10-27 | DRG: 392 | Disposition: A | Discharge status: Home / Self Care | Payer: Medicare Other | Attending: Internal Medicine | Admitting: Internal Medicine

## 2013-10-25 ENCOUNTER — Emergency Department (HOSPITAL_COMMUNITY): Payer: Medicare Other

## 2013-10-25 DIAGNOSIS — K551 Chronic vascular disorders of intestine: Secondary | ICD-10-CM | POA: Diagnosis present

## 2013-10-25 DIAGNOSIS — E785 Hyperlipidemia, unspecified: Secondary | ICD-10-CM | POA: Diagnosis present

## 2013-10-25 DIAGNOSIS — J45909 Unspecified asthma, uncomplicated: Secondary | ICD-10-CM | POA: Diagnosis present

## 2013-10-25 DIAGNOSIS — I739 Peripheral vascular disease, unspecified: Secondary | ICD-10-CM | POA: Diagnosis present

## 2013-10-25 DIAGNOSIS — F17200 Nicotine dependence, unspecified, uncomplicated: Secondary | ICD-10-CM | POA: Diagnosis present

## 2013-10-25 DIAGNOSIS — I2510 Atherosclerotic heart disease of native coronary artery without angina pectoris: Secondary | ICD-10-CM | POA: Diagnosis present

## 2013-10-25 DIAGNOSIS — E860 Dehydration: Secondary | ICD-10-CM | POA: Diagnosis not present

## 2013-10-25 DIAGNOSIS — Z7982 Long term (current) use of aspirin: Secondary | ICD-10-CM | POA: Diagnosis not present

## 2013-10-25 DIAGNOSIS — D6859 Other primary thrombophilia: Secondary | ICD-10-CM | POA: Diagnosis present

## 2013-10-25 DIAGNOSIS — R109 Unspecified abdominal pain: Secondary | ICD-10-CM | POA: Diagnosis not present

## 2013-10-25 DIAGNOSIS — I10 Essential (primary) hypertension: Secondary | ICD-10-CM | POA: Diagnosis present

## 2013-10-25 DIAGNOSIS — A088 Other specified intestinal infections: Principal | ICD-10-CM | POA: Diagnosis present

## 2013-10-25 DIAGNOSIS — R1084 Generalized abdominal pain: Secondary | ICD-10-CM | POA: Diagnosis not present

## 2013-10-25 DIAGNOSIS — E872 Acidosis, unspecified: Secondary | ICD-10-CM | POA: Diagnosis present

## 2013-10-25 DIAGNOSIS — K7689 Other specified diseases of liver: Secondary | ICD-10-CM | POA: Diagnosis not present

## 2013-10-25 DIAGNOSIS — R112 Nausea with vomiting, unspecified: Secondary | ICD-10-CM | POA: Diagnosis present

## 2013-10-25 DIAGNOSIS — E039 Hypothyroidism, unspecified: Secondary | ICD-10-CM | POA: Diagnosis present

## 2013-10-25 DIAGNOSIS — R7401 Elevation of levels of liver transaminase levels: Secondary | ICD-10-CM | POA: Diagnosis not present

## 2013-10-25 DIAGNOSIS — G894 Chronic pain syndrome: Secondary | ICD-10-CM | POA: Diagnosis not present

## 2013-10-25 DIAGNOSIS — D72829 Elevated white blood cell count, unspecified: Secondary | ICD-10-CM | POA: Diagnosis not present

## 2013-10-25 DIAGNOSIS — R7402 Elevation of levels of lactic acid dehydrogenase (LDH): Secondary | ICD-10-CM | POA: Diagnosis not present

## 2013-10-25 DIAGNOSIS — F39 Unspecified mood [affective] disorder: Secondary | ICD-10-CM | POA: Diagnosis present

## 2013-10-25 DIAGNOSIS — R1013 Epigastric pain: Secondary | ICD-10-CM | POA: Diagnosis not present

## 2013-10-25 DIAGNOSIS — Z7901 Long term (current) use of anticoagulants: Secondary | ICD-10-CM

## 2013-10-25 DIAGNOSIS — A084 Viral intestinal infection, unspecified: Secondary | ICD-10-CM | POA: Diagnosis present

## 2013-10-25 DIAGNOSIS — R7989 Other specified abnormal findings of blood chemistry: Secondary | ICD-10-CM

## 2013-10-25 HISTORY — DX: Other primary thrombophilia: D68.59

## 2013-10-25 LAB — URINALYSIS, ROUTINE W REFLEX MICROSCOPIC
BILIRUBIN URINE: NEGATIVE
GLUCOSE, UA: NEGATIVE mg/dL
HGB URINE DIPSTICK: NEGATIVE
KETONES UR: NEGATIVE mg/dL
LEUKOCYTES UA: NEGATIVE
Nitrite: NEGATIVE
PH: 7 (ref 5.0–8.0)
Protein, ur: NEGATIVE mg/dL
Specific Gravity, Urine: 1.019 (ref 1.005–1.030)
Urobilinogen, UA: 0.2 mg/dL (ref 0.0–1.0)

## 2013-10-25 LAB — COMPREHENSIVE METABOLIC PANEL
ALK PHOS: 113 U/L (ref 39–117)
ALT: 22 U/L (ref 0–35)
AST: 21 U/L (ref 0–37)
Albumin: 4.4 g/dL (ref 3.5–5.2)
BILIRUBIN TOTAL: 0.5 mg/dL (ref 0.3–1.2)
BUN: 8 mg/dL (ref 6–23)
CHLORIDE: 96 meq/L (ref 96–112)
CO2: 26 meq/L (ref 19–32)
Calcium: 9.7 mg/dL (ref 8.4–10.5)
Creatinine, Ser: 0.69 mg/dL (ref 0.50–1.10)
GFR calc Af Amer: >90 mL/min (ref >90)
Glucose, Bld: 101 mg/dL — ABNORMAL HIGH (ref 70–99)
Potassium: 3.7 mEq/L (ref 3.7–5.3)
SODIUM: 138 meq/L (ref 137–147)
Total Protein: 8.1 g/dL (ref 6.0–8.3)

## 2013-10-25 LAB — CBC WITH DIFFERENTIAL/PLATELET
BASOS PCT: 0 % (ref 0–1)
Basophils Absolute: 0.1 10*3/uL (ref 0.0–0.1)
Eosinophils Absolute: 0.2 10*3/uL (ref 0.0–0.7)
Eosinophils Relative: 1 % (ref 0–5)
HEMATOCRIT: 50 % — AB (ref 36.0–46.0)
Hemoglobin: 17.9 g/dL — ABNORMAL HIGH (ref 12.0–15.0)
LYMPHS PCT: 5 % — AB (ref 12–46)
Lymphs Abs: 1.1 10*3/uL (ref 0.7–4.0)
MCH: 33.8 pg (ref 26.0–34.0)
MCHC: 35.8 g/dL (ref 30.0–36.0)
MCV: 94.5 fL (ref 78.0–100.0)
MONO ABS: 0.5 10*3/uL (ref 0.1–1.0)
Monocytes Relative: 2 % — ABNORMAL LOW (ref 3–12)
NEUTROS ABS: 19.7 10*3/uL — AB (ref 1.7–7.7)
NEUTROS PCT: 91 % — AB (ref 43–77)
Platelets: 279 10*3/uL (ref 150–400)
RBC: 5.29 MIL/uL — ABNORMAL HIGH (ref 3.87–5.11)
RDW: 13.2 % (ref 11.5–15.5)
WBC: 21.7 10*3/uL — AB (ref 4.0–10.5)

## 2013-10-25 LAB — TROPONIN I: Troponin I: <0.3 ng/mL (ref <0.30)

## 2013-10-25 LAB — LACTIC ACID, PLASMA
Lactic Acid, Venous: 3.1 mmol/L — ABNORMAL HIGH (ref 0.5–2.2)
Lactic Acid, Venous: 1.7 mmol/L

## 2013-10-25 LAB — I-STAT CG4 LACTIC ACID, ED: Lactic Acid, Venous: 2.76 mmol/L — ABNORMAL HIGH (ref 0.5–2.2)

## 2013-10-25 LAB — LIPASE, BLOOD: Lipase: 31 U/L (ref 11–59)

## 2013-10-25 LAB — PROTIME-INR
INR: 2.47 — ABNORMAL HIGH (ref 0.00–1.49)
Prothrombin Time: 25.9 seconds — ABNORMAL HIGH (ref 11.6–15.2)

## 2013-10-25 MED ORDER — MORPHINE SULFATE 4 MG/ML IJ SOLN
4.0000 mg | Freq: Once | INTRAMUSCULAR | Status: AC
  Administered 2013-10-25: 4 mg via INTRAVENOUS
  Filled 2013-10-25: qty 1

## 2013-10-25 MED ORDER — ONDANSETRON HCL 4 MG/2ML IJ SOLN
4.0000 mg | Freq: Four times a day (QID) | INTRAMUSCULAR | Status: DC | PRN
  Administered 2013-10-26 (×2): 4 mg via INTRAVENOUS
  Filled 2013-10-25 (×3): qty 2

## 2013-10-25 MED ORDER — SIMVASTATIN 20 MG PO TABS
20.0000 mg | ORAL_TABLET | Freq: Every day | ORAL | Status: DC
  Administered 2013-10-26: 20 mg via ORAL
  Filled 2013-10-25 (×2): qty 1

## 2013-10-25 MED ORDER — ALBUTEROL SULFATE (2.5 MG/3ML) 0.083% IN NEBU: 2.5000 mg | INHALATION_SOLUTION | Freq: Four times a day (QID) | RESPIRATORY_TRACT | Status: DC | PRN

## 2013-10-25 MED ORDER — NICOTINE 14 MG/24HR TD PT24
14.0000 mg | MEDICATED_PATCH | TRANSDERMAL | Status: DC
  Administered 2013-10-26 (×2): 14 mg via TRANSDERMAL
  Filled 2013-10-25 (×3): qty 1

## 2013-10-25 MED ORDER — SODIUM CHLORIDE 0.9 % IV BOLUS (SEPSIS)
1000.0000 mL | Freq: Once | INTRAVENOUS | Status: AC
  Administered 2013-10-25: 1000 mL via INTRAVENOUS

## 2013-10-25 MED ORDER — GI COCKTAIL ~~LOC~~
30.0000 mL | Freq: Once | ORAL | Status: AC
  Administered 2013-10-25: 30 mL via ORAL
  Filled 2013-10-25: qty 30

## 2013-10-25 MED ORDER — VENLAFAXINE HCL 37.5 MG PO TABS
37.5000 mg | ORAL_TABLET | Freq: Two times a day (BID) | ORAL | Status: DC
  Administered 2013-10-26 – 2013-10-27 (×3): 37.5 mg via ORAL
  Filled 2013-10-25 (×5): qty 1

## 2013-10-25 MED ORDER — ONDANSETRON HCL 4 MG PO TABS: 4.0000 mg | ORAL_TABLET | Freq: Four times a day (QID) | ORAL | Status: DC | PRN

## 2013-10-25 MED ORDER — SODIUM CHLORIDE 0.9 % IV SOLN
INTRAVENOUS | Status: DC
  Administered 2013-10-26 (×2): via INTRAVENOUS
  Administered 2013-10-26: 125 mL/h via INTRAVENOUS
  Administered 2013-10-27: 10:00:00 via INTRAVENOUS

## 2013-10-25 MED ORDER — MORPHINE SULFATE ER 15 MG PO TBCR
15.0000 mg | EXTENDED_RELEASE_TABLET | Freq: Three times a day (TID) | ORAL | Status: DC
  Administered 2013-10-26 – 2013-10-27 (×5): 15 mg via ORAL
  Filled 2013-10-25 (×5): qty 1

## 2013-10-25 MED ORDER — BISACODYL 5 MG PO TBEC
5.0000 mg | DELAYED_RELEASE_TABLET | Freq: Every day | ORAL | Status: DC
  Administered 2013-10-26 – 2013-10-27 (×2): 5 mg via ORAL
  Filled 2013-10-25 (×2): qty 1

## 2013-10-25 MED ORDER — ASPIRIN EC 81 MG PO TBEC
81.0000 mg | DELAYED_RELEASE_TABLET | Freq: Every day | ORAL | Status: DC
  Administered 2013-10-26 – 2013-10-27 (×2): 81 mg via ORAL
  Filled 2013-10-25 (×2): qty 1

## 2013-10-25 MED ORDER — SODIUM CHLORIDE 0.9 % IJ SOLN
3.0000 mL | Freq: Two times a day (BID) | INTRAMUSCULAR | Status: DC
  Administered 2013-10-26: 3 mL via INTRAVENOUS

## 2013-10-25 MED ORDER — HYDROCHLOROTHIAZIDE 25 MG PO TABS
25.0000 mg | ORAL_TABLET | Freq: Every day | ORAL | Status: DC
  Administered 2013-10-26: 25 mg via ORAL
  Filled 2013-10-25: qty 1

## 2013-10-25 MED ORDER — ONDANSETRON HCL 4 MG/2ML IJ SOLN
4.0000 mg | Freq: Once | INTRAMUSCULAR | Status: AC
  Administered 2013-10-25: 4 mg via INTRAVENOUS
  Filled 2013-10-25: qty 2

## 2013-10-25 MED ORDER — ISOSORBIDE MONONITRATE ER 30 MG PO TB24
30.0000 mg | ORAL_TABLET | Freq: Every day | ORAL | Status: DC
  Administered 2013-10-26 – 2013-10-27 (×2): 30 mg via ORAL
  Filled 2013-10-25 (×2): qty 1

## 2013-10-25 MED ORDER — METOPROLOL TARTRATE 25 MG PO TABS
25.0000 mg | ORAL_TABLET | Freq: Every day | ORAL | Status: DC
  Administered 2013-10-26 – 2013-10-27 (×2): 25 mg via ORAL
  Filled 2013-10-25 (×2): qty 1

## 2013-10-25 MED ORDER — METHOCARBAMOL 750 MG PO TABS
750.0000 mg | ORAL_TABLET | Freq: Three times a day (TID) | ORAL | Status: DC
  Administered 2013-10-26 – 2013-10-27 (×3): 750 mg via ORAL
  Filled 2013-10-25 (×8): qty 1

## 2013-10-25 MED ORDER — LISINOPRIL-HYDROCHLOROTHIAZIDE 20-25 MG PO TABS: 1.0000 | ORAL_TABLET | ORAL | Status: DC

## 2013-10-25 MED ORDER — ALPRAZOLAM 0.5 MG PO TABS
0.5000 mg | ORAL_TABLET | Freq: Three times a day (TID) | ORAL | Status: DC | PRN
  Administered 2013-10-27: 0.5 mg via ORAL
  Filled 2013-10-25: qty 1

## 2013-10-25 MED ORDER — MORPHINE SULFATE 2 MG/ML IJ SOLN
2.0000 mg | Freq: Once | INTRAMUSCULAR | Status: AC
  Administered 2013-10-25: 2 mg via INTRAVENOUS
  Filled 2013-10-25: qty 1

## 2013-10-25 MED ORDER — LEVOTHYROXINE SODIUM 50 MCG PO TABS
50.0000 ug | ORAL_TABLET | Freq: Every day | ORAL | Status: DC
  Administered 2013-10-26 – 2013-10-27 (×2): 50 ug via ORAL
  Filled 2013-10-25 (×3): qty 1

## 2013-10-25 MED ORDER — LISINOPRIL 20 MG PO TABS
20.0000 mg | ORAL_TABLET | Freq: Every day | ORAL | Status: DC
  Administered 2013-10-26 – 2013-10-27 (×2): 20 mg via ORAL
  Filled 2013-10-25 (×2): qty 1

## 2013-10-25 MED ORDER — MORPHINE SULFATE 2 MG/ML IJ SOLN
2.0000 mg | INTRAMUSCULAR | Status: DC | PRN
  Administered 2013-10-26: 2 mg via INTRAVENOUS
  Filled 2013-10-25: qty 1

## 2013-10-25 NOTE — ED Provider Notes (Signed)
CSN: [id number]     Arrival date & time 10/25/13  1623 History   First MD Initiated Contact with Patient 10/25/13 1704     Chief Complaint  Patient presents with  . Abdominal Pain     (Consider location/radiation/quality/duration/timing/severity/associated sxs/prior Treatment) Patient is a 53 y.o. female presenting with abdominal pain. The history is provided by the patient. No language interpreter was used.  Abdominal Pain Pain location:  Epigastric, LUQ and LLQ Pain quality: aching and sharp   Pain radiates to:  LUQ and LLQ Pain severity:  Severe Onset quality:  Gradual Duration:  3 days Timing:  Constant Progression:  Worsening Chronicity:  Recurrent Context comment:  Worse after eating Relieved by:  Nothing Worsened by:  Nothing tried Ineffective treatments:  None tried Associated symptoms: nausea and vomiting   Associated symptoms: no chest pain, no chills, no constipation, no cough, no diarrhea, no dysuria, no fatigue, no fever, no melena, no shortness of breath and no sore throat   Vomiting:    Quality:  Stomach contents   Number of occurrences:  6   Severity:  Moderate   Duration:  3 hours   Timing:  Intermittent   Progression:  Unchanged Risk factors: multiple surgeries     Past Medical History  Diagnosis Date  . Hypertension   . Asthma   . Coronary artery disease   . Grave's disease   . Grave's disease   . Hyperlipidemia   . Cancer   . Peripheral vascular disease   . Antithrombin 3 deficiency    Past Surgical History  Procedure Laterality Date  . Abdominal hysterectomy    . Abdominal surgery    . Colon surgery    . Coronary artery angio  02/2010  . Aorto to celiac and superior mesenteric artery bypass  11/22/2003  . Thrombectomy of sma  01/2007   No family history on file. History  Substance Use Topics  . Smoking status: Current Every Day Smoker -- 1.00 packs/day    Types: Cigarettes  . Smokeless tobacco: Never Used  . Alcohol Use: Yes   Comment: occasional   OB History   Grav Para Term Preterm Abortions TAB SAB Ect Mult Living                 Review of Systems  Constitutional: Negative for fever, chills, diaphoresis, activity change, appetite change and fatigue.  HENT: Negative for congestion, facial swelling, rhinorrhea and sore throat.   Eyes: Negative for photophobia and discharge.  Respiratory: Negative for cough, chest tightness and shortness of breath.   Cardiovascular: Negative for chest pain, palpitations and leg swelling.  Gastrointestinal: Positive for nausea, vomiting and abdominal pain. Negative for diarrhea, constipation and melena.  Endocrine: Negative for polydipsia and polyuria.  Genitourinary: Negative for dysuria, frequency, difficulty urinating and pelvic pain.  Musculoskeletal: Negative for arthralgias, back pain, neck pain and neck stiffness.  Skin: Negative for color change and wound.  Allergic/Immunologic: Negative for immunocompromised state.  Neurological: Negative for facial asymmetry, weakness, numbness and headaches.  Hematological: Does not bruise/bleed easily.  Psychiatric/Behavioral: Negative for confusion and agitation.      Allergies  Eggs or egg-derived products; Heparin; Penicillins; and Poultry meal  Home Medications   Current Outpatient Rx  Name  Route  Sig  Dispense  Refill  . albuterol (PROVENTIL HFA;VENTOLIN HFA) 108 (90 BASE) MCG/ACT inhaler   Inhalation   Inhale 2 puffs into the lungs every 6 (six) hours as needed. Shortness of breath         .  ALPRAZolam (XANAX) 0.5 MG tablet   Oral   Take 0.5 mg by mouth 3 (three) times daily.         Marland Kitchen aspirin EC 81 MG tablet   Oral   Take 81 mg by mouth daily.         . bisacodyl (DULCOLAX) 5 MG EC tablet   Oral   Take 5 mg by mouth daily.         . chlorzoxazone (PARAFON) 500 MG tablet   Oral   Take 500 mg by mouth 2 (two) times daily.         . isosorbide mononitrate (IMDUR) 60 MG 24 hr tablet   Oral    Take 30 mg by mouth daily.         Javier Docker Oil 300 MG CAPS   Oral   Take 300 mg by mouth daily at 12 noon.         Marland Kitchen levothyroxine (SYNTHROID, LEVOTHROID) 50 MCG tablet   Oral   Take 50 mcg by mouth daily.         Marland Kitchen lisinopril-hydrochlorothiazide (PRINZIDE,ZESTORETIC) 20-25 MG per tablet   Oral   Take 1 tablet by mouth every morning.         . methocarbamol (ROBAXIN) 750 MG tablet   Oral   Take 750 mg by mouth 3 (three) times daily.         . metoprolol tartrate (LOPRESSOR) 25 MG tablet   Oral   Take 25 mg by mouth daily.          Marland Kitchen morphine (MS CONTIN) 15 MG 12 hr tablet   Oral   Take 15 mg by mouth 3 (three) times daily.         . pravastatin (PRAVACHOL) 40 MG tablet   Oral   Take 40 mg by mouth daily.         . promethazine (PHENERGAN) 25 MG tablet   Oral   Take 1 tablet by mouth as needed.         Marland Kitchen Propylene Glycol (SYSTANE BALANCE OP)   Ophthalmic   Apply 1 drop to eye as needed. For dry eyes         . venlafaxine (EFFEXOR) 37.5 MG tablet   Oral   Take 37.5 mg by mouth 2 (two) times daily.         Marland Kitchen warfarin (COUMADIN) 5 MG tablet   Oral   Take 5-7.5 mg by mouth daily. Monday Tuesday,wednesday, Friday is 5 mg. 7.5 mg on Thursday, Saturday, Sunday. Last INR check was on 10-23-13         . nitroGLYCERIN (NITROQUICK) 0.4 MG SL tablet   Sublingual   Place 0.4 mg under the tongue every 5 (five) minutes as needed. Chest pain          BP 144/58  Pulse 88  Temp(Src) 97.5 F (36.4 C) (Oral)  Resp 18  SpO2 98% Physical Exam  Constitutional: She is oriented to person, place, and time. She appears well-developed and well-nourished. No distress.  HENT:  Head: Normocephalic and atraumatic.  Mouth/Throat: No oropharyngeal exudate.  Eyes: Pupils are equal, round, and reactive to light.  Neck: Normal range of motion. Neck supple.  Cardiovascular: Normal rate, regular rhythm and normal heart sounds.  Exam reveals no gallop and no friction  rub.   No murmur heard. Pulmonary/Chest: Effort normal and breath sounds normal. No respiratory distress. She has no wheezes. She has no rales.  Abdominal:  Soft. Bowel sounds are normal. She exhibits no distension and no mass. There is tenderness in the epigastric area, left upper quadrant and left lower quadrant. There is no rigidity, no rebound and no guarding.  Musculoskeletal: Normal range of motion. She exhibits no edema and no tenderness.  Neurological: She is alert and oriented to person, place, and time.  Skin: Skin is warm and dry.  Psychiatric: She has a normal mood and affect.    ED Course  Procedures (including critical care time) Labs Review Labs Reviewed  CBC WITH DIFFERENTIAL - Abnormal; Notable for the following:    WBC 21.7 (*)    RBC 5.29 (*)    Hemoglobin 17.9 (*)    HCT 50.0 (*)    Neutrophils Relative % 91 (*)    Neutro Abs 19.7 (*)    Lymphocytes Relative 5 (*)    Monocytes Relative 2 (*)    All other components within normal limits  COMPREHENSIVE METABOLIC PANEL - Abnormal; Notable for the following:    Glucose, Bld 101 (*)    All other components within normal limits  PROTIME-INR - Abnormal; Notable for the following:    Prothrombin Time 25.9 (*)    INR 2.47 (*)    All other components within normal limits  LACTIC ACID, PLASMA - Abnormal; Notable for the following:    Lactic Acid, Venous 3.1 (*)    All other components within normal limits  I-STAT CG4 LACTIC ACID, ED - Abnormal; Notable for the following:    Lactic Acid, Venous 2.76 (*)    All other components within normal limits  URINE CULTURE  LIPASE, BLOOD  URINALYSIS, ROUTINE W REFLEX MICROSCOPIC   Imaging Review Ct Angio Abd/pel W/ And/or W/o  10/25/2013   CLINICAL DATA:  Abdominal pain.  EXAM: CT ANGIOGRAPHY ABDOMEN AND PELVIS WITH CONTRAST AND WITHOUT CONTRAST  TECHNIQUE: Multidetector CT imaging of the abdomen and pelvis was performed using the standard protocol during bolus administration of  intravenous contrast. Multiplanar reconstructed images and MIPs were obtained and reviewed to evaluate the vascular anatomy.  CONTRAST:  100 mL Omnipaque 350 intravenously.  COMPARISON:  CT scans of June 09, 2013 and Dec 27, 2008.  FINDINGS: Visualized lung bases appear normal. No osseous abnormality is noted.  Status post cholecystectomy. Two stable hepatic cysts are noted in right hepatic lobe. The spleen and pancreas appear normal. Adrenal glands appear normal. No hydronephrosis or renal obstruction is noted. No renal or ureteral calculi are noted. Stable lymphocele is seen in left side of the pelvis. Anastomotic sutures are seen involving the small bowel in the left lower quadrant of the abdomen. The appendix appears normal. There is no evidence of bowel obstruction. Atherosclerotic calcifications are seen involving the abdominal aorta and iliac arteries without evidence of aneurysm or dissection. Patient is status post bypass graft extending from proximal aorta to superior mesenteric artery. This is widely patent without evidence of stenosis. Inferior mesenteric artery is patent. 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 Urinary bladder appears normal. No abnormal fluid collection is noted.  Review of the MIP images confirms the above findings.  IMPRESSION: Stable hepatic cysts.  Patient is status post bypass graft extending from proximal aorta to superior mesenteric artery which is widely patent.  No acute abnormality is noted in the abdomen or pelvis.   Electronically Signed   By: Sabino Dick M.D.   On: 10/25/2013 20:17     EKG Interpretation  None      MDM   Final diagnoses:  Abdominal pain  Leukocytosis  Elevated lactic acid level    Pt is a 53 y.o. female with Pmhx as above who presents with epigastric and LUQ pain that whas mild for 2 days, now worse for about 3 hours after eating with assoc n/v nonbloody, non-bilious emesis. No fever,  CP, SOB, urinary symptoms. Had nml BM today.  Pt states symptoms similar to prior episode of mesenteric ischemia and has hx of aorta to celiac and SMA bypass.   8:45 PM Pt feeling improved after IV dilaudid and zofran. W/u includes UA which is nml, WBC and LA which elevated, but reassuring CT ab/pelvis which is nml.  Plan to repeat LA after IVF.  If remains elevated, will plan on admission to medical serivce, if normalized will consider d/c if symptoms controlled.       Neta Ehlers, MD 10/25/13 2204

## 2013-10-25 NOTE — ED Notes (Signed)
Pt requesting to wait for pain medicine. Pt informed to let RN know when she is ready.

## 2013-10-25 NOTE — ED Notes (Signed)
Pt reports upper left abdominal pain for 3 days and started vomiting today. Pt with hx of bypass from aorta to stomach 11 years ago (had 2 surgeries). States her stomach was dying because he wasn't getting blood- hx of antithrombin 3 deficiency. Reports theses sx are similar to what she had then. Pt taking warfarin.

## 2013-10-25 NOTE — ED Notes (Signed)
NOTIFIED GEIPLE ,[place] OF PATIENTS LAB RESULTS ,10/25/2013. CG4+ LACTIC ACID.

## 2013-10-25 NOTE — H&P (Signed)
[facility] History and Physical  Patient: Danielle Barrett  [id number]  DOB: October 17, 1960  DOS: the patient was seen and examined on 10/25/2013 PCP: Philis Fendt, MD  Chief Complaint: Abdominal pain nausea and vomiting  HPI: Danielle Barrett is a 53 y.o. 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 The patient is coming from home. The patient presented with complaints of gastric discomfort and indigestion that has been ongoing since Friday associated with episodes of nausea vomiting and abdominal pain started today. She denies any fever or chills no cough, chest pain or shortness of breath. She also denies any diarrhea but does mention that she was constipated on this Friday. She denies any active bleeding burning urination rash. She denies any outside food but mentions that his appetite. She has started taking venlafaxine 2 months ago but other than that no recent change in her medications. She's not taking her aspirin since last 2-3 years since she cannot afford it. She continues to smoke one pack a day on a daily basis Denies any alcohol or drug abuse. She mentions this symptoms are different than her prior episodes with bowel obstruction and ischemia.  Review of Systems: as mentioned in the history of present illness.  A Comprehensive review of the other systems is negative.  Past Medical History  Diagnosis Date  . Hypertension   . Asthma   . Coronary artery disease   . Grave's disease   . Grave's disease   . Hyperlipidemia   . Cancer   . Peripheral vascular disease   . Antithrombin 3 deficiency    Past Surgical History  Procedure Laterality Date  . Abdominal hysterectomy    . Abdominal surgery    . Colon surgery    . Coronary artery angio  02/2010  . Aorto to  celiac and superior mesenteric artery bypass  11/22/2003  . Thrombectomy of sma  01/2007   Social History:  reports that she has been smoking Cigarettes.  She has been smoking about 1.00 pack per day. She has never used smokeless tobacco. She reports that she drinks alcohol. She reports that she does not use illicit drugs. Independent for most of her  ADL.  Allergies  Allergen Reactions  . Eggs Or Egg-Derived Products     Hives   . Heparin     Hit   . Penicillins     Hives   . Poultry Meal     Hives     No family history on file.  Prior to Admission medications   Medication Sig Start Date End Date Taking? Authorizing Provider  albuterol (PROVENTIL HFA;VENTOLIN HFA) 108 (90 BASE) MCG/ACT inhaler Inhale 2 puffs into the lungs every 6 (six) hours as needed. Shortness of breath   Yes Historical Provider, MD  ALPRAZolam (XANAX) 0.5 MG tablet Take 0.5 mg by mouth 3 (three) times daily.   Yes Historical Provider, MD  bisacodyl (DULCOLAX) 5 MG EC tablet Take 5 mg by mouth daily.   Yes Historical Provider, MD  isosorbide mononitrate (IMDUR) 60 MG 24 hr tablet Take 30 mg by mouth daily.   Yes Historical Provider, MD  levothyroxine (SYNTHROID, LEVOTHROID) 50 MCG tablet Take 50 mcg by mouth daily.   Yes Historical Provider, MD  lisinopril-hydrochlorothiazide (PRINZIDE,ZESTORETIC) 20-25 MG per tablet Take 1 tablet by mouth every morning.   Yes Historical Provider,  MD  methocarbamol (ROBAXIN) 750 MG tablet Take 750 mg by mouth 3 (three) times daily.   Yes Historical Provider, MD  metoprolol tartrate (LOPRESSOR) 25 MG tablet Take 25 mg by mouth daily.    Yes Historical Provider, MD  morphine (MS CONTIN) 15 MG 12 hr tablet Take 15 mg by mouth 3 (three) times daily.   Yes Historical Provider, MD  pravastatin (PRAVACHOL) 40 MG tablet Take 40 mg by mouth daily.   Yes Historical Provider, MD  Propylene Glycol (SYSTANE BALANCE OP) Apply 1 drop to eye as needed. For dry eyes   Yes Historical Provider,  MD  venlafaxine (EFFEXOR) 37.5 MG tablet Take 37.5 mg by mouth 2 (two) times daily.   Yes Historical Provider, MD  warfarin (COUMADIN) 5 MG tablet Take 5-7.5 mg by mouth daily. Monday Tuesday,wednesday, Friday is 5 mg. 7.5 mg on Thursday, Saturday, Sunday. Last INR check was on 10-23-13   Yes Historical Provider, MD  nitroGLYCERIN (NITROQUICK) 0.4 MG SL tablet Place 0.4 mg under the tongue every 5 (five) minutes as needed. Chest pain    Historical Provider, MD    Physical Exam: Filed Vitals:   10/25/13 1958 10/25/13 2045 10/25/13 2050 10/25/13 2115  BP: 149/64 154/68 154/68 144/58  Pulse: 86 97 97 88  Temp:      TempSrc:      Resp: 16  18   SpO2: 99% 99% 99% 98%    General: Alert, Awake and Oriented to Time, Place and Person. Appear in mild distress Eyes: PERRL ENT: Oral Mucosa clear moist. Neck: No JVD Cardiovascular: S1 and S2 Present, no Murmur, Peripheral Pulses Present Respiratory: Bilateral Air entry equal and Decreased, Clear to Auscultation,  No Crackles, no wheezes Abdomen: Bowel Sound Present, Soft and diffuse mild tender no guarding no rigidity Skin: No Rash Extremities: No Pedal edema, no calf tenderness Neurologic: Grossly Unremarkable. Labs on Admission:  CBC:  Recent Labs Lab 10/25/13 1721  WBC 21.7*  NEUTROABS 19.7*  HGB 17.9*  HCT 50.0*  MCV 94.5  PLT 279    CMP     Component Value Date/Time   NA 138 10/25/2013 1721   K 3.7 10/25/2013 1721   CL 96 10/25/2013 1721   CO2 26 10/25/2013 1721   GLUCOSE 101* 10/25/2013 1721   BUN 8 10/25/2013 1721   CREATININE 0.69 10/25/2013 1721   CALCIUM 9.7 10/25/2013 1721   PROT 8.1 10/25/2013 1721   ALBUMIN 4.4 10/25/2013 1721   AST 21 10/25/2013 1721   ALT 22 10/25/2013 1721   ALKPHOS 113 10/25/2013 1721   BILITOT 0.5 10/25/2013 1721   GFRNONAA >90 10/25/2013 1721   GFRAA >90 10/25/2013 1721     Recent Labs Lab 10/25/13 1721  LIPASE 31   No results found for this basename: AMMONIA,  in the last 168 hours  No results found for  this basename: CKTOTAL, CKMB, CKMBINDEX, TROPONINI,  in the last 168 hours BNP (last 3 results) No results found for this basename: PROBNP,  in the last 8760 hours  Radiological Exams on Admission: Ct Angio Abd/pel W/ And/or W/o  10/25/2013   CLINICAL DATA:  Abdominal pain.  EXAM: CT ANGIOGRAPHY ABDOMEN AND PELVIS WITH CONTRAST AND WITHOUT CONTRAST  TECHNIQUE: Multidetector CT imaging of the abdomen and pelvis was performed using the standard protocol during bolus administration of intravenous contrast. Multiplanar reconstructed images and MIPs were obtained and reviewed to evaluate the vascular anatomy.  CONTRAST:  100 mL Omnipaque 350 intravenously.  COMPARISON:  CT  scans of June 09, 2013 and Dec 27, 2008.  FINDINGS: Visualized lung bases appear normal. No osseous abnormality is noted.  Status post cholecystectomy. Two stable hepatic cysts are noted in right hepatic lobe. The spleen and pancreas appear normal. Adrenal glands appear normal. No hydronephrosis or renal obstruction is noted. No renal or ureteral calculi are noted. Stable lymphocele is seen in left side of the pelvis. Anastomotic sutures are seen involving the small bowel in the left lower quadrant of the abdomen. The appendix appears normal. There is no evidence of bowel obstruction. Atherosclerotic calcifications are seen involving the abdominal aorta and iliac arteries without evidence of aneurysm or dissection. Patient is status post bypass graft extending from proximal aorta to superior mesenteric artery. This is widely patent without evidence of stenosis. Inferior mesenteric artery is patent. 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 Urinary bladder appears normal. No abnormal fluid collection is noted.  Review of the MIP images confirms the above findings.  IMPRESSION: Stable hepatic cysts.  Patient is status post bypass graft extending from proximal aorta to superior mesenteric artery  which is widely patent.  No acute abnormality is noted in the abdomen or pelvis.   Electronically Signed   By: Sabino Dick M.D.   On: 10/25/2013 20:17    EKG: Independently reviewed. normal EKG, normal sinus rhythm, nonspecific ST and T waves changes.  Assessment/Plan Principal Problem:   Abdominal pain Active Problems:   Peripheral vascular disease, unspecified   Coronary artery disease   Smoker   Intractable nausea and vomiting   Viral gastroenteritis   Dehydration   Hypothyroidism   Mood disorder   Chronic pain syndrome   1. Abdominal pain The patient is presenting with complaints of epigastric pain and diffuse abdominal pain associated with nausea vomiting. She had elevated lactic acid in the ED on her arrival and with her history of aorto mesenteric bypass she underwent a CT angiogram of the abdomen which is negative for any acute obstruction. Also the CT scan of the abdomen is negative for any acute intra-abdominal pathology. Her lipase level is negative. With this she will be admitted to the hospital due to intractable nausea and vomiting and pain control. She has a significantly elevated leukocytosis but does not appear to have any fever therefore I would refrain from any antibiotics at this point. I will keep her n.p.o. for bowel rest, give her IV fluids give her IV Protonix given IV Zofran as needed and IV morphine for pain as needed. Most likely she appears to have viral gastroenteritis we'll recheck her lactic acid CBC and CMP in 4 hours.  2. Peripheral vascular disease Coronary artery disease Active smoker Patient has been recommended to quit smoking Patient has been recommended to continue compliant with her aspirin She'll continue on warfarin Follow serial troponin due to her history of coronary artery disease and epigastric pain. Nicotine patch for smoking  3. Anti-thrombin 3 deficiency Continue warfarin  4. Hypothyroidism Continue Synthroid  DVT Prophylaxis:  mechanical compression device Nutrition: N.p.o.  Code Status: Full  Family Communication: Husband was present at bedside, opportunity was given to ask question and all questions were answered satisfactorily at the time of interview. Disposition: Admitted to observation in telemetry unit.  Author: Berle Mull, MD [facility] Pager: [phone] 10/25/2013, 10:28 PM    If 7PM-7AM, please contact night-coverage [email or web address] Password [email or web address]

## 2013-10-26 DIAGNOSIS — E872 Acidosis, unspecified: Secondary | ICD-10-CM

## 2013-10-26 DIAGNOSIS — A084 Viral intestinal infection, unspecified: Secondary | ICD-10-CM | POA: Diagnosis present

## 2013-10-26 DIAGNOSIS — E039 Hypothyroidism, unspecified: Secondary | ICD-10-CM | POA: Diagnosis present

## 2013-10-26 DIAGNOSIS — I739 Peripheral vascular disease, unspecified: Secondary | ICD-10-CM | POA: Diagnosis present

## 2013-10-26 DIAGNOSIS — R109 Unspecified abdominal pain: Secondary | ICD-10-CM

## 2013-10-26 DIAGNOSIS — I2510 Atherosclerotic heart disease of native coronary artery without angina pectoris: Secondary | ICD-10-CM | POA: Diagnosis not present

## 2013-10-26 DIAGNOSIS — E860 Dehydration: Secondary | ICD-10-CM | POA: Diagnosis not present

## 2013-10-26 DIAGNOSIS — F39 Unspecified mood [affective] disorder: Secondary | ICD-10-CM | POA: Diagnosis present

## 2013-10-26 DIAGNOSIS — G894 Chronic pain syndrome: Secondary | ICD-10-CM | POA: Diagnosis not present

## 2013-10-26 DIAGNOSIS — F17200 Nicotine dependence, unspecified, uncomplicated: Secondary | ICD-10-CM | POA: Diagnosis present

## 2013-10-26 DIAGNOSIS — R112 Nausea with vomiting, unspecified: Secondary | ICD-10-CM | POA: Diagnosis present

## 2013-10-26 LAB — COMPREHENSIVE METABOLIC PANEL
ALT: 12 U/L (ref 0–35)
AST: 15 U/L (ref 0–37)
Albumin: 2.9 g/dL — ABNORMAL LOW (ref 3.5–5.2)
Alkaline Phosphatase: 76 U/L (ref 39–117)
BILIRUBIN TOTAL: 0.5 mg/dL (ref 0.3–1.2)
BUN: 9 mg/dL (ref 6–23)
CHLORIDE: 103 meq/L (ref 96–112)
CO2: 26 mEq/L (ref 19–32)
Calcium: 8.2 mg/dL — ABNORMAL LOW (ref 8.4–10.5)
Creatinine, Ser: 0.77 mg/dL (ref 0.50–1.10)
GFR calc non Af Amer: >90 mL/min (ref >90)
GLUCOSE: 94 mg/dL (ref 70–99)
POTASSIUM: 3.9 meq/L (ref 3.7–5.3)
SODIUM: 141 meq/L (ref 137–147)
Total Protein: 5.9 g/dL — ABNORMAL LOW (ref 6.0–8.3)

## 2013-10-26 LAB — CBC
HCT: 42.2 % (ref 36.0–46.0)
Hemoglobin: 14.4 g/dL (ref 12.0–15.0)
MCH: 32.6 pg (ref 26.0–34.0)
MCHC: 34.1 g/dL (ref 30.0–36.0)
MCV: 95.5 fL (ref 78.0–100.0)
PLATELETS: 231 10*3/uL (ref 150–400)
RBC: 4.42 MIL/uL (ref 3.87–5.11)
RDW: 13.5 % (ref 11.5–15.5)
WBC: 10.7 10*3/uL — AB (ref 4.0–10.5)

## 2013-10-26 LAB — LACTIC ACID, PLASMA: Lactic Acid, Venous: 1.4 mmol/L (ref 0.5–2.2)

## 2013-10-26 LAB — LIPASE, BLOOD: Lipase: 18 U/L (ref 11–59)

## 2013-10-26 MED ORDER — WARFARIN SODIUM 5 MG PO TABS
5.0000 mg | ORAL_TABLET | ORAL | Status: DC
  Administered 2013-10-26: 5 mg via ORAL
  Filled 2013-10-26 (×2): qty 1

## 2013-10-26 MED ORDER — PANTOPRAZOLE SODIUM 40 MG IV SOLR
40.0000 mg | Freq: Two times a day (BID) | INTRAVENOUS | Status: DC
  Administered 2013-10-26 (×2): 40 mg via INTRAVENOUS
  Filled 2013-10-26 (×4): qty 40

## 2013-10-26 MED ORDER — WARFARIN SODIUM 7.5 MG PO TABS: 7.5000 mg | ORAL_TABLET | ORAL | Status: DC

## 2013-10-26 MED ORDER — WARFARIN - PHARMACIST DOSING INPATIENT
Freq: Every day | Status: DC
  Administered 2013-10-26: 18:00:00

## 2013-10-26 NOTE — Progress Notes (Signed)
UR completed. Patient changed to inpatient- requiring IVF @ 125cc/hr

## 2013-10-26 NOTE — Progress Notes (Signed)
ANTICOAGULATION CONSULT NOTE - Initial Consult  Pharmacy Consult for Warfarin  Indication: Aortic to SMA/celiac bypass with h/o graft thrombosis  Allergies  Allergen Reactions  . Eggs Or Egg-Derived Products     Hives   . Heparin     Hit   . Penicillins     Hives   . Poultry Meal     Hives     Patient Measurements: Height: 5\' 4"  (162.6 cm) Weight: 154 lb 12.8 oz (70.217 kg) IBW/kg (Calculated) : 54.7  Vital Signs: Temp: 99 F (37.2 C) (03/08 2356) Temp src: Oral (03/08 1627) BP: 135/51 mmHg (03/08 2356) Pulse Rate: 85 (03/08 2356)  Labs:  Recent Labs  10/25/13 1721 10/25/13 2300  HGB 17.9*  --   HCT 50.0*  --   PLT 279  --   LABPROT 25.9*  --   INR 2.47*  --   CREATININE 0.69  --   TROPONINI  --  <0.30   Medical History: Past Medical History  Diagnosis Date  . Hypertension   . Asthma   . Coronary artery disease   . Grave's disease   . Grave's disease   . Hyperlipidemia   . Cancer   . Peripheral vascular disease   . Antithrombin 3 deficiency     Medications:  Warfarin PTA: 7.5mg  Thurs/Sat/Sun, 5mg  all other days   Assessment: 53 y/o F with h/o aortic to SMA/celiac bypass with previous graft thrombosis who is maintained on warfarin. INR is 2.47 on admit. Other labs as above.   Goal of Therapy:  INR 2-3 Monitor platelets by anticoagulation protocol: Yes   Plan:  -Continue warfarin per home regimen -Daily PT/INR, adjust dose as needed  -Monitor for bleeding  Narda Bonds 10/26/2013,12:11 AM

## 2013-10-26 NOTE — Consult Note (Signed)
[name] Consultation Note  Referring Provider:  Dr. Estill Cotta [facility]) Primary Care Physician:  Kandice Hams, MD  Reason for Consultation:  Abdominal pain   HPI: Danielle Barrett is a 53 y.o. female whom we've been asked to see for abdominal pain.  Started acutely a couple days ago, progressively worsening, epigastric in location, with some associated nausea and vomiting.  Symptoms similar to occluded graft from her aorto-SMA bypass; CT scan done yesterday showed widely patent graft and no other acute process.  Patient was having some looser stools as well.  No fevers.  No sick contacts.  Today, starting to feel much better.   Past Medical History  Diagnosis Date  . Hypertension   . Asthma   . Coronary artery disease   . Grave's disease   . Grave's disease   . Hyperlipidemia   . Cancer   . Peripheral vascular disease   . Antithrombin 3 deficiency     Past Surgical History  Procedure Laterality Date  . Abdominal hysterectomy    . Abdominal surgery    . Colon surgery    . Coronary artery angio  02/2010  . Aorto to celiac and superior mesenteric artery bypass  11/22/2003  . Thrombectomy of sma  01/2007    Prior to Admission medications   Medication Sig Start Date End Date Taking? Authorizing Provider  albuterol (PROVENTIL HFA;VENTOLIN HFA) 108 (90 BASE) MCG/ACT inhaler Inhale 2 puffs into the lungs every 6 (six) hours as needed. Shortness of breath   Yes Historical Provider, MD  ALPRAZolam (XANAX) 0.5 MG tablet Take 0.5 mg by mouth 3 (three) times daily.   Yes Historical Provider, MD  bisacodyl (DULCOLAX) 5 MG EC tablet Take 5 mg by mouth daily.   Yes Historical Provider, MD  isosorbide mononitrate (IMDUR) 60 MG 24 hr tablet Take 30 mg by mouth daily.   Yes Historical Provider, MD  levothyroxine (SYNTHROID, LEVOTHROID) 50 MCG tablet Take 50 mcg by mouth daily.   Yes Historical Provider, MD  lisinopril-hydrochlorothiazide (PRINZIDE,ZESTORETIC) 20-25 MG per tablet  Take 1 tablet by mouth every morning.   Yes Historical Provider, MD  methocarbamol (ROBAXIN) 750 MG tablet Take 750 mg by mouth 3 (three) times daily.   Yes Historical Provider, MD  metoprolol tartrate (LOPRESSOR) 25 MG tablet Take 25 mg by mouth daily.    Yes Historical Provider, MD  morphine (MS CONTIN) 15 MG 12 hr tablet Take 15 mg by mouth 3 (three) times daily.   Yes Historical Provider, MD  pravastatin (PRAVACHOL) 40 MG tablet Take 40 mg by mouth daily.   Yes Historical Provider, MD  Propylene Glycol (SYSTANE BALANCE OP) Apply 1 drop to eye as needed. For dry eyes   Yes Historical Provider, MD  venlafaxine (EFFEXOR) 37.5 MG tablet Take 37.5 mg by mouth 2 (two) times daily.   Yes Historical Provider, MD  warfarin (COUMADIN) 5 MG tablet Take 5-7.5 mg by mouth daily. Monday Tuesday,wednesday, Friday is 5 mg. 7.5 mg on Thursday, Saturday, Sunday. Last INR check was on 10-23-13   Yes Historical Provider, MD  nitroGLYCERIN (NITROQUICK) 0.4 MG SL tablet Place 0.4 mg under the tongue every 5 (five) minutes as needed. Chest pain    Historical Provider, MD    Current Facility-Administered Medications  Medication Dose Route Frequency Provider Last Rate Last Dose  . 0.9 %  sodium chloride infusion   Intravenous Continuous Berle Mull, MD 125 mL/hr at 10/26/13 0831    . albuterol (PROVENTIL) (2.5 MG/3ML) 0.083%  nebulizer solution 2.5 mg  2.5 mg Inhalation Q6H PRN Berle Mull, MD      . ALPRAZolam Duanne Moron) tablet 0.5 mg  0.5 mg Oral TID PRN Berle Mull, MD      . aspirin EC tablet 81 mg  81 mg Oral Daily Berle Mull, MD   81 mg at 10/26/13 1006  . bisacodyl (DULCOLAX) EC tablet 5 mg  5 mg Oral Daily Berle Mull, MD   5 mg at 10/26/13 1007  . isosorbide mononitrate (IMDUR) 24 hr tablet 30 mg  30 mg Oral Daily Berle Mull, MD   30 mg at 10/26/13 1007  . levothyroxine (SYNTHROID, LEVOTHROID) tablet 50 mcg  50 mcg Oral QAC breakfast Berle Mull, MD   50 mcg at 10/26/13 0829  . lisinopril  (PRINIVIL,ZESTRIL) tablet 20 mg  20 mg Oral Daily Berle Mull, MD   20 mg at 10/26/13 1007  . methocarbamol (ROBAXIN) tablet 750 mg  750 mg Oral 3 times per day Berle Mull, MD   750 mg at 10/26/13 0018  . metoprolol tartrate (LOPRESSOR) tablet 25 mg  25 mg Oral Daily Berle Mull, MD   25 mg at 10/26/13 1006  . morphine (MS CONTIN) 12 hr tablet 15 mg  15 mg Oral 3 times per day Berle Mull, MD   15 mg at 10/26/13 1330  . morphine 2 MG/ML injection 2 mg  2 mg Intravenous Q3H PRN Berle Mull, MD   2 mg at 10/26/13 0349  . nicotine (NICODERM CQ - dosed in mg/24 hours) patch 14 mg  14 mg Transdermal Q24H Berle Mull, MD   14 mg at 10/26/13 0018  . ondansetron (ZOFRAN) tablet 4 mg  4 mg Oral Q6H PRN Berle Mull, MD       Or  . ondansetron [facility]) injection 4 mg  4 mg Intravenous Q6H PRN Berle Mull, MD   4 mg at 10/26/13 [id number]  . pantoprazole (PROTONIX) injection 40 mg  40 mg Intravenous Q12H Ripudeep K Rai, MD   40 mg at 10/26/13 1055  . simvastatin (ZOCOR) tablet 20 mg  20 mg Oral q1800 Berle Mull, MD      . sodium chloride 0.9 % injection 3 mL  3 mL Intravenous Q12H Berle Mull, MD   3 mL at 10/26/13 0019  . venlafaxine (EFFEXOR) tablet 37.5 mg  37.5 mg Oral BID WC Berle Mull, MD   37.5 mg at 10/26/13 1007  . warfarin (COUMADIN) tablet 5 mg  5 mg Oral Once per day on Mon Tue Wed Fri Narda Bonds, [facility]      . [START ON 10/29/2013] warfarin (COUMADIN) tablet 7.5 mg  7.5 mg Oral Once per day on Sun Thu Sat Narda Bonds, [facility]      . Warfarin - Pharmacist Dosing Inpatient   Does not apply [place], [facility]        Allergies as of 10/25/2013 - Review Complete 10/25/2013  Allergen Reaction Noted  . Eggs or egg-derived products  08/29/2011  . Heparin  08/29/2011  . Penicillins  08/29/2011  . Poultry meal  08/29/2011    History reviewed. No pertinent family history.  History   Social History  . Marital Status: Married    Spouse Name: N/A    Number of Children: N/A  . Years of  Education: N/A   Occupational History  . Not on file.   Social History Main Topics  . Smoking status: Current Every Day Smoker -- 1.00 packs/day    Types:  Cigarettes  . Smokeless tobacco: Never Used  . Alcohol Use: Yes     Comment: occasional  . Drug Use: No  . Sexual Activity: Yes    Birth Control/ Protection: Surgical   Other Topics Concern  . Not on file   Social History Narrative  . No narrative on file    Review of Systems: As per HPI, all others negative  Physical Exam: Vital signs in last 24 hours: Temp:  [97.5 F (36.4 C)-99.9 F (37.7 C)] 98.8 F (37.1 C) (03/09 1257) Pulse Rate:  [74-102] 75 (03/09 1257) Resp:  [16-22] 16 (03/09 1257) BP: (96-154)/(44-68) 96/56 mmHg (03/09 1257) SpO2:  [92 %-99 %] 92 % (03/09 1257) Weight:  [70.217 kg (154 lb 12.8 oz)] 70.217 kg (154 lb 12.8 oz) (03/09 0500) Last BM Date: 10/25/13 General:   Alert,  Well-developed, older-appearing than stated age, NAD Head:  Normocephalic and atraumatic. Eyes:  Sclera clear, no icterus.   Conjunctiva pink. Ears:  Normal auditory acuity. Nose:  No deformity, discharge,  or lesions. Mouth:  Poor dentition, No deformity or lesions.  Oropharynx pink & moist. Neck:  Supple; no masses or thyromegaly. Lungs:  Clear throughout to auscultation.   No wheezes, crackles, or rhonchi. No acute distress. Heart:  Regular rate and rhythm; no murmurs, clicks, rubs,  or gallops. Abdomen:  Soft,non-tender nondistended. No masses, hepatosplenomegaly or hernias noted. Normal bowel sounds, without guarding, and without rebound.     Msk:  Symmetrical without gross deformities. Normal posture. Pulses:  Normal pulses noted. Extremities:  Without clubbing or edema. Neurologic:  Alert and  oriented x4;  Diffusely weak with neuropathy, otherwise grossly normal neurologically. Skin:  Scattered ecchymoses, otherwise intact without significant lesions or rashes. Psych:  Alert and cooperative. Normal mood and  affect.   Lab Results:  Recent Labs  10/25/13 1721 10/26/13 0305  WBC 21.7* 10.7*  HGB 17.9* 14.4  HCT 50.0* 42.2  PLT 279 231   BMET  Recent Labs  10/25/13 1721 10/26/13 0305  NA 138 141  K 3.7 3.9  CL 96 103  CO2 26 26  GLUCOSE 101* 94  BUN 8 9  CREATININE 0.69 0.77  CALCIUM 9.7 8.2*   LFT  Recent Labs  10/26/13 0305  PROT 5.9*  ALBUMIN 2.9*  AST 15  ALT 12  ALKPHOS 76  BILITOT 0.5   PT/INR  Recent Labs  10/25/13 1721  LABPROT 25.9*  INR 2.47*    Studies/Results: Ct Angio Abd/pel W/ And/or W/o  10/25/2013   CLINICAL DATA:  Abdominal pain.  EXAM: CT ANGIOGRAPHY ABDOMEN AND PELVIS WITH CONTRAST AND WITHOUT CONTRAST  TECHNIQUE: Multidetector CT imaging of the abdomen and pelvis was performed using the standard protocol during bolus administration of intravenous contrast. Multiplanar reconstructed images and MIPs were obtained and reviewed to evaluate the vascular anatomy.  CONTRAST:  100 mL Omnipaque 350 intravenously.  COMPARISON:  CT scans of June 09, 2013 and Dec 27, 2008.  FINDINGS: Visualized lung bases appear normal. No osseous abnormality is noted.  Status post cholecystectomy. Two stable hepatic cysts are noted in right hepatic lobe. The spleen and pancreas appear normal. Adrenal glands appear normal. No hydronephrosis or renal obstruction is noted. No renal or ureteral calculi are noted. Stable lymphocele is seen in left side of the pelvis. Anastomotic sutures are seen involving the small bowel in the left lower quadrant of the abdomen. The appendix appears normal. There is no evidence of bowel obstruction. Atherosclerotic calcifications are seen involving the  abdominal aorta and iliac arteries without evidence of aneurysm or dissection. Patient is status post bypass graft extending from proximal aorta to superior mesenteric artery. This is widely patent without evidence of stenosis. Inferior mesenteric artery is patent. Celiac artery appears to be  occluded at its origin which is unchanged compared to prior exam. Urinary bladder appears normal. No abnormal fluid collection is noted.  Review of the MIP images confirms the above findings.  IMPRESSION: Stable hepatic cysts.  Patient is status post bypass graft extending from proximal aorta to superior mesenteric artery which is widely patent.  No acute abnormality is noted in the abdomen or pelvis.   Electronically Signed   By: Sabino Dick M.D.   On: 10/25/2013 20:17   Impression:  1.  Epigastric abdominal pain.  Near-complete resolution.  Suspect acute infectious gastroenteritis.  Plan:  1.  Start liquid diet, advance as tolerated. 2.  PPI. 3.  If patient does well with above measures #1 and #2, would not do any further GI evaluation.  If, on the other hand, her pain recurs with attempting to advance diet, would consider endoscopy (which could conceivably be done while still on anticoagulation) as next step in management. 4.  Patient is in agreement with the above management recommendations.   LOS: 1 day   Darold Miley M  10/26/2013, 3:23 PM

## 2013-10-26 NOTE — Progress Notes (Signed)
Patient ID: Danielle Barrett  female  UKG:[id number]    DOB: 05-26-1961    DOA: 10/25/2013  PCP: Kandice Hams, MD  Assessment/Plan: Principal Problem:  Abdominal pain with nausea/vomiting: ? Esophagitis/gastritis/PUD The patient is presenting with complaints of epigastric pain and diffuse abdominal pain associated with nausea vomiting. She had elevated lactic acid in the ED on her arrival and with her history of aorto mesenteric bypass she underwent a CT angiogram of the abdomen which is negative for any acute obstruction. Also the CT scan of the abdomen is negative for any acute intra-abdominal pathology.  - lipase level and LFTs are normal - She has significant leukocytosis which has almost normalized (? Unclear etiology likely stress demargination, afebrile) - - patient has been complaining of burping and GERD at home, currently n.p.o., continue IV fluids and IV Protonix - GI consult called  Lactic acidosis with dehydration:  - Improved with IV fluid hydration   Peripheral vascular disease /Coronary artery disease  - Currently stable, continue aspirin - Troponins negative  Active smoker  Patient has been recommended to quit smoking  Nicotine patch for smoking    Anti-thrombin 3 deficiency  Continue warfarin per pharmacy  Hypothyroidism  Continue Synthroid   DVT Prophylaxis: On Coumadin  Code Status: Full code  Family Communication: Discussed with the patient in detail  Disposition: Discussed in detail with Dr. [name] (patient's PCP, confirmed with the patient). Dr. Delfina Redwood will assume care. [facility] hospitalist service will sign off.   Consultants:  Gastroenterology  Procedures:  None  Antibiotics:  None    Subjective: Still has nausea, mild epigastric pain improving, low-grade temp 99.9 Objective: Weight change:   Intake/Output Summary (Last 24 hours) at 10/26/13 1135 Last data filed at 10/25/13 2217  Gross per 24 hour  Intake   1000 ml  Output      0  ml  Net   1000 ml   Blood pressure 115/44, pulse 102, temperature 99.9 F (37.7 C), temperature source Oral, resp. rate 18, height 5\' 4"  (1.626 m), weight 70.217 kg (154 lb 12.8 oz), SpO2 95.00%.  Physical Exam: General: Alert and awake, oriented x3, not in any acute distress. CVS: S1-S2 clear, no murmur rubs or gallops Chest: clear to auscultation bilaterally, no wheezing, rales or rhonchi Abdomen: Mild epigastric tenderness, nondistended, normal bowel sounds  Extremities: no cyanosis, clubbing or edema noted bilaterally   Lab Results: Basic Metabolic Panel:  Recent Labs Lab 10/25/13 1721 10/26/13 0305  NA 138 141  K 3.7 3.9  CL 96 103  CO2 26 26  GLUCOSE 101* 94  BUN 8 9  CREATININE 0.69 0.77  CALCIUM 9.7 8.2*   Liver Function Tests:  Recent Labs Lab 10/25/13 1721 10/26/13 0305  AST 21 15  ALT 22 12  ALKPHOS 113 76  BILITOT 0.5 0.5  PROT 8.1 5.9*  ALBUMIN 4.4 2.9*    Recent Labs Lab 10/25/13 1721 10/26/13 0305  LIPASE 31 18   No results found for this basename: AMMONIA,  in the last 168 hours CBC:  Recent Labs Lab 10/25/13 1721 10/26/13 0305  WBC 21.7* 10.7*  NEUTROABS 19.7*  --   HGB 17.9* 14.4  HCT 50.0* 42.2  MCV 94.5 95.5  PLT 279 231   Cardiac Enzymes:  Recent Labs Lab 10/25/13 2300  TROPONINI <0.30   BNP: No components found with this basename: POCBNP,  CBG: No results found for this basename: GLUCAP,  in the last 168 hours   Micro Results:  No results found for this or any previous visit (from the past 240 hour(s)).  Studies/Results: Ct Angio Abd/pel W/ And/or W/o  10/25/2013   CLINICAL DATA:  Abdominal pain.  EXAM: CT ANGIOGRAPHY ABDOMEN AND PELVIS WITH CONTRAST AND WITHOUT CONTRAST  TECHNIQUE: Multidetector CT imaging of the abdomen and pelvis was performed using the standard protocol during bolus administration of intravenous contrast. Multiplanar reconstructed images and MIPs were obtained and reviewed to evaluate the  vascular anatomy.  CONTRAST:  100 mL Omnipaque 350 intravenously.  COMPARISON:  CT scans of June 09, 2013 and Dec 27, 2008.  FINDINGS: Visualized lung bases appear normal. No osseous abnormality is noted.  Status post cholecystectomy. Two stable hepatic cysts are noted in right hepatic lobe. The spleen and pancreas appear normal. Adrenal glands appear normal. No hydronephrosis or renal obstruction is noted. No renal or ureteral calculi are noted. Stable lymphocele is seen in left side of the pelvis. Anastomotic sutures are seen involving the small bowel in the left lower quadrant of the abdomen. The appendix appears normal. There is no evidence of bowel obstruction. Atherosclerotic calcifications are seen involving the abdominal aorta and iliac arteries without evidence of aneurysm or dissection. Patient is status post bypass graft extending from proximal aorta to superior mesenteric artery. This is widely patent without evidence of stenosis. Inferior mesenteric artery is patent. 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 Urinary bladder appears normal. No abnormal fluid collection is noted.  Review of the MIP images confirms the above findings.  IMPRESSION: Stable hepatic cysts.  Patient is status post bypass graft extending from proximal aorta to superior mesenteric artery which is widely patent.  No acute abnormality is noted in the abdomen or pelvis.   Electronically Signed   By: Sabino Dick M.D.   On: 10/25/2013 20:17    Medications: Scheduled Meds: . aspirin EC  81 mg Oral Daily  . bisacodyl  5 mg Oral Daily  . lisinopril  20 mg Oral Daily   And  . hydrochlorothiazide  25 mg Oral Daily  . isosorbide mononitrate  30 mg Oral Daily  . levothyroxine  50 mcg Oral QAC breakfast  . methocarbamol  750 mg Oral 3 times per day  . metoprolol tartrate  25 mg Oral Daily  . morphine  15 mg Oral 3 times per day  . nicotine  14 mg Transdermal Q24H  .  pantoprazole (PROTONIX) IV  40 mg Intravenous Q12H  . simvastatin  20 mg Oral q1800  . sodium chloride  3 mL Intravenous Q12H  . venlafaxine  37.5 mg Oral BID WC  . warfarin  5 mg Oral Once per day on Mon Tue Wed Fri  . [START ON 10/29/2013] warfarin  7.5 mg Oral Once per day on Sun Thu Sat  . Warfarin - Pharmacist Dosing Inpatient   Does not apply q1800      LOS: 1 day   Glennis Montenegro M.D. [facility] 10/26/2013, 11:35 AM Pager: [phone]  If 7PM-7AM, please contact night-coverage [email or web address] Password [email or web address]

## 2013-10-27 DIAGNOSIS — R1084 Generalized abdominal pain: Secondary | ICD-10-CM | POA: Diagnosis not present

## 2013-10-27 DIAGNOSIS — R109 Unspecified abdominal pain: Secondary | ICD-10-CM | POA: Diagnosis not present

## 2013-10-27 DIAGNOSIS — D72829 Elevated white blood cell count, unspecified: Secondary | ICD-10-CM | POA: Diagnosis not present

## 2013-10-27 LAB — URINE CULTURE
CULTURE: NO GROWTH
Colony Count: NO GROWTH

## 2013-10-27 LAB — PROTIME-INR
INR: 2.02 — ABNORMAL HIGH (ref 0.00–1.49)
Prothrombin Time: 22.2 seconds — ABNORMAL HIGH (ref 11.6–15.2)

## 2013-10-27 LAB — BASIC METABOLIC PANEL
BUN: 6 mg/dL (ref 6–23)
CALCIUM: 8.3 mg/dL — AB (ref 8.4–10.5)
CHLORIDE: 105 meq/L (ref 96–112)
CO2: 23 meq/L (ref 19–32)
CREATININE: 0.69 mg/dL (ref 0.50–1.10)
GFR calc Af Amer: >90 mL/min (ref >90)
GFR calc non Af Amer: >90 mL/min (ref >90)
Glucose, Bld: 87 mg/dL (ref 70–99)
Potassium: 3.7 mEq/L (ref 3.7–5.3)
Sodium: 142 mEq/L (ref 137–147)

## 2013-10-27 MED ORDER — ASPIRIN EC 81 MG PO TBEC: 81.0000 mg | DELAYED_RELEASE_TABLET | Freq: Every day | ORAL | Status: AC

## 2013-10-27 MED ORDER — PANTOPRAZOLE SODIUM 40 MG PO TBEC: 40.0000 mg | DELAYED_RELEASE_TABLET | Freq: Two times a day (BID) | ORAL | Status: DC

## 2013-10-27 NOTE — Discharge Summary (Signed)
Physician Discharge Summary  Patient ID: Danielle Barrett MRN: [id number] DOB/AGE: [birth year] 53 y.o.  Admit date: 10/25/2013 Discharge date: 10/27/2013  Admission Diagnoses:  Discharge Diagnoses:  Principal Problem:   Abdominal pain Active Problems:   Peripheral vascular disease, unspecified   Coronary artery disease   Smoker   Intractable nausea and vomiting   Viral gastroenteritis   Dehydration   Hypothyroidism   Mood disorder   Chronic pain syndrome   Discharged Condition: stable  Hospital Course:  Patient presented to the hospital with complaint of abdominal pain nausea and vomiting. She has a history of mesenteric ischemia. In the emergency room she was evaluated. Fortunately CT of the abdomen and pelvis show patency of graft. Her labs did show elevated lactic acid and significant leukocytosis. Because of that admission was deemed necessary for further evaluation and treatment. Patient was given judicious IV fluids had followup labs which had resolution of leukocytosis as well as lactic acidosis. Patient was at her baseline without nausea vomiting without abdominal pain. She was seen in consultation by gastroenterology. Patient was tolerant of diet had normal vitals and was felt to be stable for discharge to home. It is possible that she is stressed margination as a cause for her abnormal lab, leukocytosis. At this time there has been no further recommendation by GI.  Consults: Treatment Team:  Arta Silence, MD  Significant Diagnostic Studies:Ct Angio Abd/pel W/ And/or W/o  10/25/2013   CLINICAL DATA:  Abdominal pain.  EXAM: CT ANGIOGRAPHY ABDOMEN AND PELVIS WITH CONTRAST AND WITHOUT CONTRAST  TECHNIQUE: Multidetector CT imaging of the abdomen and pelvis was performed using the standard protocol during bolus administration of intravenous contrast. Multiplanar reconstructed images and MIPs were obtained and reviewed to evaluate the vascular anatomy.  CONTRAST:  100 mL Omnipaque 350  intravenously.  COMPARISON:  CT scans of June 09, 2013 and Dec 27, 2008.  FINDINGS: Visualized lung bases appear normal. No osseous abnormality is noted.  Status post cholecystectomy. Two stable hepatic cysts are noted in right hepatic lobe. The spleen and pancreas appear normal. Adrenal glands appear normal. No hydronephrosis or renal obstruction is noted. No renal or ureteral calculi are noted. Stable lymphocele is seen in left side of the pelvis. Anastomotic sutures are seen involving the small bowel in the left lower quadrant of the abdomen. The appendix appears normal. There is no evidence of bowel obstruction. Atherosclerotic calcifications are seen involving the abdominal aorta and iliac arteries without evidence of aneurysm or dissection. Patient is status post bypass graft extending from proximal aorta to superior mesenteric artery. This is widely patent without evidence of stenosis. Inferior mesenteric artery is patent. 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 Urinary bladder appears normal. No abnormal fluid collection is noted.  Review of the MIP images confirms the above findings.  IMPRESSION: Stable hepatic cysts.  Patient is status post bypass graft extending from proximal aorta to superior mesenteric artery which is widely patent.  No acute abnormality is noted in the abdomen or pelvis.   Electronically Signed   By: Sabino Dick M.D.   On: 10/25/2013 20:17      Discharge Exam: Blood pressure 121/65, pulse 74, temperature 97.6 F (36.4 C), temperature source Oral, resp. rate 18, height 5\' 4"  (1.626 m), weight 71.079 kg (156 lb 11.2 oz), SpO2 96.00%. General appearance: alert and cooperative Resp: clear to auscultation bilaterally Cardio: regular rate and rhythm, S1, S2 normal, no murmur, click, rub  or gallop GI: soft, non-tender; bowel sounds normal; no masses,  no organomegaly Extremities: extremities normal, atraumatic, no cyanosis  or edema  Disposition: 01-Home or Self Care   Future Appointments Provider Department Dept Phone   06/17/2014 8:30 AM [facility] [phone]   Eat a light meal the night before the exam Nothing to eat or drink for at least 9 hours before the exam No gum chewing, or smoking the morning of the exam Please take your morning medications with Small sips of water, especially blood pressure medication *Very Important* Please wear 2 piece clothing   06/17/2014 9:00 AM Elam Dutch, MD [facility] [phone]       Medication List    STOP taking these medications       chlorzoxazone 500 MG tablet  Commonly known as:  PARAFON     Krill Oil 300 MG Caps     promethazine 25 MG tablet  Commonly known as:  PHENERGAN      TAKE these medications       albuterol 108 (90 BASE) MCG/ACT inhaler  Commonly known as:  PROVENTIL HFA;VENTOLIN HFA  Inhale 2 puffs into the lungs every 6 (six) hours as needed. Shortness of breath     ALPRAZolam 0.5 MG tablet  Commonly known as:  XANAX  Take 0.5 mg by mouth 3 (three) times daily.     aspirin EC 81 MG tablet  Take 1 tablet (81 mg total) by mouth daily.     bisacodyl 5 MG EC tablet  Commonly known as:  DULCOLAX  Take 5 mg by mouth daily.     isosorbide mononitrate 60 MG 24 hr tablet  Commonly known as:  IMDUR  Take 30 mg by mouth daily.     levothyroxine 50 MCG tablet  Commonly known as:  SYNTHROID, LEVOTHROID  Take 50 mcg by mouth daily.     lisinopril-hydrochlorothiazide 20-25 MG per tablet  Commonly known as:  PRINZIDE,ZESTORETIC  Take 1 tablet by mouth every morning.     methocarbamol 750 MG tablet  Commonly known as:  ROBAXIN  Take 750 mg by mouth 3 (three) times daily.     metoprolol tartrate 25 MG tablet  Commonly known as:  LOPRESSOR  Take 25 mg by mouth daily.     morphine 15 MG 12 hr tablet  Commonly known as:  MS CONTIN  Take 15 mg by mouth 3  (three) times daily.     NITROQUICK 0.4 MG SL tablet  Generic drug:  nitroGLYCERIN  Place 0.4 mg under the tongue every 5 (five) minutes as needed. Chest pain     pravastatin 40 MG tablet  Commonly known as:  PRAVACHOL  Take 40 mg by mouth daily.     SYSTANE BALANCE OP  Apply 1 drop to eye as needed. For dry eyes     venlafaxine 37.5 MG tablet  Commonly known as:  EFFEXOR  Take 37.5 mg by mouth 2 (two) times daily.     warfarin 5 MG tablet  Commonly known as:  COUMADIN  Take 5-7.5 mg by mouth daily. Monday Tuesday,wednesday, Friday is 5 mg. 7.5 mg on Thursday, Saturday, Sunday. Last INR check was on 10-23-13           Follow-up Information   Follow up with Tristin Gladman D, MD In 1 week.   Specialty:  Internal Medicine   Contact information:   [address] [place]  [address] [phone]       Signed: Kandice Hams 10/27/2013, 12:20 PM

## 2013-10-27 NOTE — Progress Notes (Signed)
[facility] PROGRESS NOTE Subjective Pt feels much better, tolerating CLs w/o pain. She is allergic to all poultry and egg products including all baked goods with eggs so is afraid to eat hospital food.  Objective: Vital signs in last 24 hours: Temp:  [97.6 F (36.4 C)-98.8 F (37.1 C)] 97.6 F (36.4 C) (03/10 0557) Pulse Rate:  [69-102] 69 (03/10 0557) Resp:  [16-18] 18 (03/10 0557) BP: (96-115)/(44-56) 103/46 mmHg (03/10 0557) SpO2:  [92 %-98 %] 96 % (03/10 0557) Weight:  [71.079 kg (156 lb 11.2 oz)] 71.079 kg (156 lb 11.2 oz) (03/10 0557) Last BM Date: 10/25/13  Intake/Output from previous day:   Intake/Output this shift:    PE: General--alert appears ok  Abdomen--soft nondistended, good BSs nontender  Lab Results:  Recent Labs  10/25/13 1721 10/26/13 0305  WBC 21.7* 10.7*  HGB 17.9* 14.4  HCT 50.0* 42.2  PLT 279 231   BMET  Recent Labs  10/25/13 1721 10/26/13 0305 10/27/13 0500  NA 138 141 142  K 3.7 3.9 3.7  CL 96 103 105  CO2 26 26 23   CREATININE 0.69 0.77 0.69   LFT  Recent Labs  10/25/13 1721 10/26/13 0305  PROT 8.1 5.9*  AST 21 15  ALT 22 12  ALKPHOS 113 76  BILITOT 0.5 0.5   PT/INR  Recent Labs  10/25/13 1721 10/27/13 0500  LABPROT 25.9* 22.2*  INR 2.47* 2.02*   PANCREAS  Recent Labs  10/25/13 1721 10/26/13 0305  LIPASE 31 18         Studies/Results: Ct Angio Abd/pel W/ And/or W/o  10/25/2013   CLINICAL DATA:  Abdominal pain.  EXAM: CT ANGIOGRAPHY ABDOMEN AND PELVIS WITH CONTRAST AND WITHOUT CONTRAST  TECHNIQUE: Multidetector CT imaging of the abdomen and pelvis was performed using the standard protocol during bolus administration of intravenous contrast. Multiplanar reconstructed images and MIPs were obtained and reviewed to evaluate the vascular anatomy.  CONTRAST:  100 mL Omnipaque 350 intravenously.  COMPARISON:  CT scans of June 09, 2013 and Dec 27, 2008.  FINDINGS: Visualized lung bases appear normal.  No osseous abnormality is noted.  Status post cholecystectomy. Two stable hepatic cysts are noted in right hepatic lobe. The spleen and pancreas appear normal. Adrenal glands appear normal. No hydronephrosis or renal obstruction is noted. No renal or ureteral calculi are noted. Stable lymphocele is seen in left side of the pelvis. Anastomotic sutures are seen involving the small bowel in the left lower quadrant of the abdomen. The appendix appears normal. There is no evidence of bowel obstruction. Atherosclerotic calcifications are seen involving the abdominal aorta and iliac arteries without evidence of aneurysm or dissection. Patient is status post bypass graft extending from proximal aorta to superior mesenteric artery. This is widely patent without evidence of stenosis. Inferior mesenteric artery is patent. 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 Urinary bladder appears normal. No abnormal fluid collection is noted.  Review of the MIP images confirms the above findings.  IMPRESSION: Stable hepatic cysts.  Patient is status post bypass graft extending from proximal aorta to superior mesenteric artery which is widely patent.  No acute abnormality is noted in the abdomen or pelvis.   Electronically Signed   By: Sabino Dick M.D.   On: 10/25/2013 20:17    Medications: I have reviewed the patient's current medications.  Assessment/Plan: 1. Abdominal Pain. Appears resolved. ? Infectious. CT angio shows no change in graft. 2. Mesenteric  Vascular Disease. S/p aorto to celiac/SMA bypass 2005. Celiac occluded but SMA widely patent no change. 3. Allergic to Poultry, eggs, and baked goods containing eggs. Will need dietary help.   Lashaundra Lehrmann JR,Ezequiel Macauley L 10/27/2013, 8:10 AM

## 2013-10-27 NOTE — Discharge Instructions (Signed)
Cardiac Diet °This diet can help prevent heart disease and stroke. Many factors influence your heart health, including eating and exercise habits. Coronary risk rises a lot with abnormal blood fat (lipid) levels. Cardiac meal planning includes limiting unhealthy fats, increasing healthy fats, and making other small dietary changes. General guidelines are as follows: °· Adjust calorie intake to reach and maintain desirable body weight. °· Limit total fat intake to less than 30% of total calories. Saturated fat should be less than 7% of calories. °· Saturated fats are found in animal products and in some vegetable products. Saturated vegetable fats are found in coconut oil, cocoa butter, palm oil, and palm kernel oil. Read labels carefully to avoid these products as much as possible. Use butter in moderation. Choose tub margarines and oils that have 2 grams of fat or less. Good cooking oils are canola and olive oils. °· Practice low-fat cooking techniques. Do not fry food. Instead, broil, bake, boil, steam, grill, roast on a rack, stir-fry, or microwave it. Other fat reducing suggestions include: °· Remove the skin from poultry. °· Remove all visible fat from meats. °· Skim the fat off stews, soups, and gravies before serving them. °· Steam vegetables in water or broth instead of sautéing them in fat. °· Avoid foods with trans fat (or hydrogenated oils), such as commercially fried foods and commercially baked goods. Commercial shortening and deep-frying fats will contain trans fat. °· Increase intake of fruits, vegetables, whole grains, and legumes to replace foods high in fat. °· Increase consumption of nuts, legumes, and seeds to at least 4 servings weekly. One serving of a legume equals ½ cup, and 1 serving of nuts or seeds equals ¼ cup. °· Choose whole grains more often. Have 3 servings per day (a serving is 1 ounce [oz]). °· Eat 4 to 5 servings of vegetables per day. A serving of vegetables is 1 cup of raw leafy  vegetables; ½ cup of raw or cooked cut-up vegetables; ½ cup of vegetable juice. °· Eat 4 to 5 servings of fruit per day. A serving of fruit is 1 medium whole fruit; ¼ cup of dried fruit; ½ cup of fresh, frozen, or canned fruit; ½ cup of 100% fruit juice. °· Increase your intake of dietary fiber to 20 to 30 grams per day. Insoluble fiber may help lower your risk of heart disease and may help curb your appetite.  °Soluble fiber binds cholesterol to be removed from the blood. Foods high in soluble fiber are dried beans, citrus fruits, oats, apples, bananas, broccoli, Brussels sprouts, and eggplant. °· Try to include foods fortified with plant sterols or stanols, such as yogurt, breads, juices, or margarines. Choose several fortified foods to achieve a daily intake of 2 to 3 grams of plant sterols or stanols. °· Foods with omega-3 fats can help reduce your risk of heart disease. Aim to have a 3.5 oz portion of fatty fish twice per week, such as salmon, mackerel, albacore tuna, sardines, lake trout, or herring. If you wish to take a fish oil supplement, choose one that contains 1 gram of both DHA and EPA. °· Limit processed meats to 2 servings (3 oz portion) weekly. °· Limit the sodium in your diet to 1500 milligrams (mg) per day. If you have high blood pressure, talk to a registered dietitian about a DASH (Dietary Approaches to Stop Hypertension) eating plan. °· Limit sweets and beverages with added sugar, such as soda, to no more than 5 servings per week. One   serving is:   °· 1 tablespoon sugar. °· 1 tablespoon jelly or jam. °· ½ cup sorbet. °· 1 cup lemonade. °· ½ cup regular soda. °CHOOSING FOODS °Starches °· Allowed: Breads: All kinds (wheat, rye, raisin, white, oatmeal, Italian, French, and English muffin bread). Low-fat rolls: English muffins, frankfurter and hamburger buns, bagels, pita bread, tortillas (not fried). Pancakes, waffles, biscuits, and muffins made with recommended oil. °· Avoid: Products made with  saturated or trans fats, oils, or whole milk products. Butter rolls, cheese breads, croissants. Commercial doughnuts, muffins, sweet rolls, biscuits, waffles, pancakes, store-bought mixes. °Crackers °· Allowed: Low-fat crackers and snacks: Animal, graham, rye, saltine (with recommended oil, no lard), oyster, and matzo crackers. Bread sticks, melba toast, rusks, flatbread, pretzels, and light popcorn. °· Avoid: High-fat crackers: cheese crackers, butter crackers, and those made with coconut, palm oil, or trans fat (hydrogenated oils). Buttered popcorn. °Cereals °· Allowed: Hot or cold whole-grain cereals. °· Avoid: Cereals containing coconut, hydrogenated vegetable fat, or animal fat. °Potatoes / Pasta / Rice °· Allowed: All kinds of potatoes, rice, and pasta (such as macaroni, spaghetti, and noodles). °· Avoid: Pasta or rice prepared with cream sauce or high-fat cheese. Chow mein noodles, French fries. °Vegetables °· Allowed: All vegetables and vegetable juices. °· Avoid: Fried vegetables. Vegetables in cream, butter, or high-fat cheese sauces. Limit coconut. Fruit in cream or custard. °Protein °· Allowed: Limit your intake of meat, seafood, and poultry to no more than 6 oz (cooked weight) per day. All lean, well-trimmed beef, veal, pork, and lamb. All chicken and turkey without skin. All fish and shellfish. Wild game: wild duck, rabbit, pheasant, and venison. Egg whites or low-cholesterol egg substitutes may be used as desired. Meatless dishes: recipes with dried beans, peas, lentils, and tofu (soybean curd). Seeds and nuts: all seeds and most nuts. °· Avoid: Prime grade and other heavily marbled and fatty meats, such as short ribs, spare ribs, rib eye roast or steak, frankfurters, sausage, bacon, and high-fat luncheon meats, mutton. Caviar. Commercially fried fish. Domestic duck, goose, venison sausage. Organ meats: liver, gizzard, heart, chitterlings, brains, kidney, sweetbreads. °Dairy °· Allowed: Low-fat  cheeses: nonfat or low-fat cottage cheese (1% or 2% fat), cheeses made with part skim milk, such as mozzarella, farmers, string, or ricotta. (Cheeses should be labeled no more than 2 to 6 grams fat per oz.). Skim (or 1%) milk: liquid, powdered, or evaporated. Buttermilk made with low-fat milk. Drinks made with skim or low-fat milk or cocoa. Chocolate milk or cocoa made with skim or low-fat (1%) milk. Nonfat or low-fat yogurt. °· Avoid: Whole milk cheeses, including colby, cheddar, muenster, Monterey Jack, Havarti, Brie, Camembert, American, Swiss, and blue. Creamed cottage cheese, cream cheese. Whole milk and whole milk products, including buttermilk or yogurt made from whole milk, drinks made from whole milk. Condensed milk, evaporated whole milk, and 2% milk. °Soups and Combination Foods °· Allowed: Low-fat low-sodium soups: broth, dehydrated soups, homemade broth, soups with the fat removed, homemade cream soups made with skim or low-fat milk. Low-fat spaghetti, lasagna, chili, and Spanish rice if low-fat ingredients and low-fat cooking techniques are used. °· Avoid: Cream soups made with whole milk, cream, or high-fat cheese. All other soups. °Desserts and Sweets °· Allowed: Sherbet, fruit ices, gelatins, meringues, and angel food cake. Homemade desserts with recommended fats, oils, and milk products. Jam, jelly, honey, marmalade, sugars, and syrups. Pure sugar candy, such as gum drops, hard candy, jelly beans, marshmallows, mints, and small amounts of dark chocolate. °· Avoid: Commercially prepared   cakes, pies, cookies, frosting, pudding, or mixes for these products. Desserts containing whole milk products, chocolate, coconut, lard, palm oil, or palm kernel oil. Ice cream or ice cream drinks. Candy that contains chocolate, coconut, butter, hydrogenated fat, or unknown ingredients. Buttered syrups. °Fats and Oils °· Allowed: Vegetable oils: safflower, sunflower, corn, soybean, cottonseed, sesame, canola, olive,  or peanut. Non-hydrogenated margarines. Salad dressing or mayonnaise: homemade or commercial, made with a recommended oil. Low or nonfat salad dressing or mayonnaise. °· Limit added fats and oils to 6 to 8 tsp per day (includes fats used in cooking, baking, salads, and spreads on bread). Remember to count the "hidden fats" in foods. °· Avoid: Solid fats and shortenings: butter, lard, salt pork, bacon drippings. Gravy containing meat fat, shortening, or suet. Cocoa butter, coconut. Coconut oil, palm oil, palm kernel oil, or hydrogenated oils: these ingredients are often used in bakery products, nondairy creamers, whipped toppings, candy, and commercially fried foods. Read labels carefully. Salad dressings made of unknown oils, sour cream, or cheese, such as blue cheese and Roquefort. Cream, all kinds: half-and-half, light, heavy, or whipping. Sour cream or cream cheese (even if "light" or low-fat). Nondairy cream substitutes: coffee creamers and sour cream substitutes made with palm, palm kernel, hydrogenated oils, or coconut oil. °Beverages °· Allowed: Coffee (regular or decaffeinated), tea. Diet carbonated beverages, mineral water. Alcohol: Check with your caregiver. Moderation is recommended. °· Avoid: Whole milk, regular sodas, and juice drinks with added sugar. °Condiments °· Allowed: All seasonings and condiments. Cocoa powder. "Cream" sauces made with recommended ingredients. °· Avoid: Carob powder made with hydrogenated fats. °SAMPLE MENU °Breakfast °· ½ cup orange juice °· ½ cup oatmeal °· 1 slice toast °· 1 tsp margarine °· 1 cup skim milk °Lunch °· Turkey sandwich with 2 oz turkey, 2 slices bread °· Lettuce and tomato slices °· Fresh fruit °· Carrot sticks °· Coffee or tea °Snack °· Fresh fruit or low-fat crackers °Dinner °· 3 oz lean ground beef °· 1 baked potato °· 1 tsp margarine °· ½ cup asparagus °· Lettuce salad °· 1 tbs non-creamy dressing °· ½ cup peach slices °· 1 cup skim milk °Document Released:  05/15/2008 Document Revised: 02/05/2012 Document Reviewed: 10/30/2011 °[employer]® Patient Information ©2014 [employer]. ° °

## 2013-10-27 NOTE — Progress Notes (Signed)
Nutrition Brief Note  Patient identified on the Malnutrition Screening Tool (MST) Report  Wt Readings from Last 15 Encounters:  10/27/13 156 lb 11.2 oz (71.079 kg)  06/11/13 154 lb 9.6 oz (70.126 kg)  06/12/12 157 lb 3.2 oz (71.305 kg)    Body mass index is 26.88 kg/(m^2). Patient meets criteria for overweight based on current BMI.   Current diet order is Heart Healthy, patient is consuming approximately 100% of meals at this time. Labs and medications reviewed.   RD consulted for food preferences in pt with food allergies to eggs and poultry.  RD met with pt who is capable of ordering foods appropriate for her needs.  Pt has ordered lunch and dinner today.  She is eating well and states she is planning to be discharged today.  RD stated availability should pt have nutrition-related concerns. No nutrition interventions warranted at this time. If nutrition issues arise, please consult RD.   Brynda Greathouse, MS RD LDN Clinical Inpatient Dietitian Pager: [phone] Weekend/After hours pager: [phone]

## 2013-10-27 NOTE — Progress Notes (Signed)
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 Eggs Or Egg-Derived Products     Hives   . Heparin     Hit   . Penicillins     Hives   . Poultry Meal     Hives     Patient Measurements: Height: 5\' 4"  (162.6 cm) Weight: 156 lb 11.2 oz (71.079 kg) IBW/kg (Calculated) : 54.7  Vital Signs: Temp: 97.6 F (36.4 C) (03/10 0557) Temp src: Oral (03/10 0557) BP: 121/65 mmHg (03/10 0941) Pulse Rate: 74 (03/10 0942)  Labs:  Recent Labs  10/25/13 1721 10/25/13 2300 10/26/13 0305 10/27/13 0500  HGB 17.9*  --  14.4  --   HCT 50.0*  --  42.2  --   PLT 279  --  231  --   LABPROT 25.9*  --   --  22.2*  INR 2.47*  --   --  2.02*  CREATININE 0.69  --  0.77 0.69  TROPONINI  --  <0.30  --   --    Medications:  Warfarin PTA: 7.5mg  Thurs/Sat/Sun, 5mg  all other days   Assessment: 53 y/o F with h/o aortic to SMA/celiac bypass with previous graft thrombosis who is maintained on warfarin. INR was 2.47 on admit- decreased to 2.02 this morning. Patient did NOT receive a dose of warfarin on 3/8 as she did not take it before coming to the hospital per medication history and it was not reordered in-house until early 2/9 morning. This is likely the cause for the acute drop in INR. CBC is stable. No bleeding noted.  Goal of Therapy:  INR 2-3 Monitor platelets by anticoagulation protocol: Yes   Plan:  1. Continue warfarin per home regimen- 5mg  daily except 7.5mg  on Thursdays, Saturdays and Sundays 2. Daily PT/INR, adjust dose as needed- if she remains stable, can switch to 3x week checks 3. Monitor for bleeding  Alberto Schoch D. Arlene Brickel, PharmD, BCPS Clinical Pharmacist Pager: [phone] 10/27/2013 10:05 AM

## 2013-10-30 DIAGNOSIS — M545 Low back pain, unspecified: Secondary | ICD-10-CM | POA: Diagnosis not present

## 2013-10-30 DIAGNOSIS — G894 Chronic pain syndrome: Secondary | ICD-10-CM | POA: Diagnosis not present

## 2013-10-30 DIAGNOSIS — M47817 Spondylosis without myelopathy or radiculopathy, lumbosacral region: Secondary | ICD-10-CM | POA: Diagnosis not present

## 2013-10-30 DIAGNOSIS — N949 Unspecified condition associated with female genital organs and menstrual cycle: Secondary | ICD-10-CM | POA: Diagnosis not present

## 2013-11-03 DIAGNOSIS — R109 Unspecified abdominal pain: Secondary | ICD-10-CM | POA: Diagnosis not present

## 2013-11-03 DIAGNOSIS — K559 Vascular disorder of intestine, unspecified: Secondary | ICD-10-CM | POA: Diagnosis not present

## 2013-11-03 DIAGNOSIS — G8929 Other chronic pain: Secondary | ICD-10-CM | POA: Diagnosis not present

## 2013-11-03 DIAGNOSIS — E039 Hypothyroidism, unspecified: Secondary | ICD-10-CM | POA: Diagnosis not present

## 2013-11-19 DIAGNOSIS — Z5181 Encounter for therapeutic drug level monitoring: Secondary | ICD-10-CM | POA: Diagnosis not present

## 2013-11-19 DIAGNOSIS — D689 Coagulation defect, unspecified: Secondary | ICD-10-CM | POA: Diagnosis not present

## 2013-11-19 DIAGNOSIS — Z7901 Long term (current) use of anticoagulants: Secondary | ICD-10-CM | POA: Diagnosis not present

## 2013-11-27 DIAGNOSIS — G894 Chronic pain syndrome: Secondary | ICD-10-CM | POA: Diagnosis not present

## 2013-11-27 DIAGNOSIS — M545 Low back pain, unspecified: Secondary | ICD-10-CM | POA: Diagnosis not present

## 2013-11-27 DIAGNOSIS — M47817 Spondylosis without myelopathy or radiculopathy, lumbosacral region: Secondary | ICD-10-CM | POA: Diagnosis not present

## 2013-11-27 DIAGNOSIS — N949 Unspecified condition associated with female genital organs and menstrual cycle: Secondary | ICD-10-CM | POA: Diagnosis not present

## 2013-12-07 DIAGNOSIS — E039 Hypothyroidism, unspecified: Secondary | ICD-10-CM | POA: Diagnosis not present

## 2013-12-17 DIAGNOSIS — D689 Coagulation defect, unspecified: Secondary | ICD-10-CM | POA: Diagnosis not present

## 2013-12-17 DIAGNOSIS — Z7901 Long term (current) use of anticoagulants: Secondary | ICD-10-CM | POA: Diagnosis not present

## 2013-12-17 DIAGNOSIS — Z5181 Encounter for therapeutic drug level monitoring: Secondary | ICD-10-CM | POA: Diagnosis not present

## 2014-01-14 DIAGNOSIS — Z7901 Long term (current) use of anticoagulants: Secondary | ICD-10-CM | POA: Diagnosis not present

## 2014-01-14 DIAGNOSIS — D6859 Other primary thrombophilia: Secondary | ICD-10-CM | POA: Diagnosis not present

## 2014-01-14 DIAGNOSIS — Z5181 Encounter for therapeutic drug level monitoring: Secondary | ICD-10-CM | POA: Diagnosis not present

## 2014-01-14 DIAGNOSIS — K551 Chronic vascular disorders of intestine: Secondary | ICD-10-CM | POA: Diagnosis not present

## 2014-01-14 DIAGNOSIS — D689 Coagulation defect, unspecified: Secondary | ICD-10-CM | POA: Diagnosis not present

## 2014-01-22 DIAGNOSIS — Z5181 Encounter for therapeutic drug level monitoring: Secondary | ICD-10-CM | POA: Diagnosis not present

## 2014-01-22 DIAGNOSIS — M47817 Spondylosis without myelopathy or radiculopathy, lumbosacral region: Secondary | ICD-10-CM | POA: Diagnosis not present

## 2014-01-22 DIAGNOSIS — N949 Unspecified condition associated with female genital organs and menstrual cycle: Secondary | ICD-10-CM | POA: Diagnosis not present

## 2014-01-22 DIAGNOSIS — Z79899 Other long term (current) drug therapy: Secondary | ICD-10-CM | POA: Diagnosis not present

## 2014-01-22 DIAGNOSIS — G894 Chronic pain syndrome: Secondary | ICD-10-CM | POA: Diagnosis not present

## 2014-01-22 DIAGNOSIS — N94819 Vulvodynia, unspecified: Secondary | ICD-10-CM | POA: Diagnosis not present

## 2014-02-11 DIAGNOSIS — Z7901 Long term (current) use of anticoagulants: Secondary | ICD-10-CM | POA: Diagnosis not present

## 2014-02-11 DIAGNOSIS — D689 Coagulation defect, unspecified: Secondary | ICD-10-CM | POA: Diagnosis not present

## 2014-02-11 DIAGNOSIS — Z5181 Encounter for therapeutic drug level monitoring: Secondary | ICD-10-CM | POA: Diagnosis not present

## 2014-02-11 DIAGNOSIS — D6859 Other primary thrombophilia: Secondary | ICD-10-CM | POA: Diagnosis not present

## 2014-03-11 DIAGNOSIS — Z7901 Long term (current) use of anticoagulants: Secondary | ICD-10-CM | POA: Diagnosis not present

## 2014-03-11 DIAGNOSIS — Z5181 Encounter for therapeutic drug level monitoring: Secondary | ICD-10-CM | POA: Diagnosis not present

## 2014-03-11 DIAGNOSIS — D689 Coagulation defect, unspecified: Secondary | ICD-10-CM | POA: Diagnosis not present

## 2014-03-12 DIAGNOSIS — M79609 Pain in unspecified limb: Secondary | ICD-10-CM | POA: Diagnosis not present

## 2014-03-12 DIAGNOSIS — F329 Major depressive disorder, single episode, unspecified: Secondary | ICD-10-CM | POA: Diagnosis not present

## 2014-03-12 DIAGNOSIS — I10 Essential (primary) hypertension: Secondary | ICD-10-CM | POA: Diagnosis not present

## 2014-03-12 DIAGNOSIS — F411 Generalized anxiety disorder: Secondary | ICD-10-CM | POA: Diagnosis not present

## 2014-03-12 DIAGNOSIS — E782 Mixed hyperlipidemia: Secondary | ICD-10-CM | POA: Diagnosis not present

## 2014-03-12 DIAGNOSIS — F17200 Nicotine dependence, unspecified, uncomplicated: Secondary | ICD-10-CM | POA: Diagnosis not present

## 2014-03-12 DIAGNOSIS — F3289 Other specified depressive episodes: Secondary | ICD-10-CM | POA: Diagnosis not present

## 2014-03-12 DIAGNOSIS — E039 Hypothyroidism, unspecified: Secondary | ICD-10-CM | POA: Diagnosis not present

## 2014-03-19 DIAGNOSIS — G894 Chronic pain syndrome: Secondary | ICD-10-CM | POA: Diagnosis not present

## 2014-03-19 DIAGNOSIS — M47817 Spondylosis without myelopathy or radiculopathy, lumbosacral region: Secondary | ICD-10-CM | POA: Diagnosis not present

## 2014-03-19 DIAGNOSIS — N949 Unspecified condition associated with female genital organs and menstrual cycle: Secondary | ICD-10-CM | POA: Diagnosis not present

## 2014-03-19 DIAGNOSIS — N94819 Vulvodynia, unspecified: Secondary | ICD-10-CM | POA: Diagnosis not present

## 2014-03-19 DIAGNOSIS — Z5181 Encounter for therapeutic drug level monitoring: Secondary | ICD-10-CM | POA: Diagnosis not present

## 2014-03-19 DIAGNOSIS — Z79899 Other long term (current) drug therapy: Secondary | ICD-10-CM | POA: Diagnosis not present

## 2014-04-08 DIAGNOSIS — Z5181 Encounter for therapeutic drug level monitoring: Secondary | ICD-10-CM | POA: Diagnosis not present

## 2014-04-08 DIAGNOSIS — Z7901 Long term (current) use of anticoagulants: Secondary | ICD-10-CM | POA: Diagnosis not present

## 2014-04-22 DIAGNOSIS — Z79899 Other long term (current) drug therapy: Secondary | ICD-10-CM | POA: Diagnosis not present

## 2014-04-22 DIAGNOSIS — F3289 Other specified depressive episodes: Secondary | ICD-10-CM | POA: Diagnosis not present

## 2014-04-22 DIAGNOSIS — D6859 Other primary thrombophilia: Secondary | ICD-10-CM | POA: Diagnosis not present

## 2014-04-22 DIAGNOSIS — I898 Other specified noninfective disorders of lymphatic vessels and lymph nodes: Secondary | ICD-10-CM | POA: Diagnosis not present

## 2014-04-22 DIAGNOSIS — Z7982 Long term (current) use of aspirin: Secondary | ICD-10-CM | POA: Diagnosis not present

## 2014-04-22 DIAGNOSIS — Z90710 Acquired absence of both cervix and uterus: Secondary | ICD-10-CM | POA: Diagnosis not present

## 2014-04-22 DIAGNOSIS — R87629 Unspecified abnormal cytological findings in specimens from vagina: Secondary | ICD-10-CM | POA: Diagnosis not present

## 2014-04-22 DIAGNOSIS — Z7901 Long term (current) use of anticoagulants: Secondary | ICD-10-CM | POA: Diagnosis not present

## 2014-04-22 DIAGNOSIS — G4700 Insomnia, unspecified: Secondary | ICD-10-CM | POA: Diagnosis not present

## 2014-04-22 DIAGNOSIS — Z803 Family history of malignant neoplasm of breast: Secondary | ICD-10-CM | POA: Diagnosis not present

## 2014-04-22 DIAGNOSIS — M25529 Pain in unspecified elbow: Secondary | ICD-10-CM | POA: Diagnosis not present

## 2014-04-22 DIAGNOSIS — Z951 Presence of aortocoronary bypass graft: Secondary | ICD-10-CM | POA: Diagnosis not present

## 2014-04-22 DIAGNOSIS — I2510 Atherosclerotic heart disease of native coronary artery without angina pectoris: Secondary | ICD-10-CM | POA: Diagnosis not present

## 2014-04-22 DIAGNOSIS — Z9089 Acquired absence of other organs: Secondary | ICD-10-CM | POA: Diagnosis not present

## 2014-04-22 DIAGNOSIS — F411 Generalized anxiety disorder: Secondary | ICD-10-CM | POA: Diagnosis not present

## 2014-04-22 DIAGNOSIS — C549 Malignant neoplasm of corpus uteri, unspecified: Secondary | ICD-10-CM | POA: Diagnosis not present

## 2014-04-22 DIAGNOSIS — N893 Dysplasia of vagina, unspecified: Secondary | ICD-10-CM | POA: Diagnosis not present

## 2014-04-22 DIAGNOSIS — R109 Unspecified abdominal pain: Secondary | ICD-10-CM | POA: Diagnosis not present

## 2014-04-22 DIAGNOSIS — I10 Essential (primary) hypertension: Secondary | ICD-10-CM | POA: Diagnosis not present

## 2014-04-22 DIAGNOSIS — Z8542 Personal history of malignant neoplasm of other parts of uterus: Secondary | ICD-10-CM | POA: Diagnosis not present

## 2014-04-22 DIAGNOSIS — Z09 Encounter for follow-up examination after completed treatment for conditions other than malignant neoplasm: Secondary | ICD-10-CM | POA: Diagnosis not present

## 2014-04-22 DIAGNOSIS — J45909 Unspecified asthma, uncomplicated: Secondary | ICD-10-CM | POA: Diagnosis not present

## 2014-04-22 DIAGNOSIS — E0500 Thyrotoxicosis with diffuse goiter without thyrotoxic crisis or storm: Secondary | ICD-10-CM | POA: Diagnosis not present

## 2014-04-22 DIAGNOSIS — Z9079 Acquired absence of other genital organ(s): Secondary | ICD-10-CM | POA: Diagnosis not present

## 2014-04-22 DIAGNOSIS — F329 Major depressive disorder, single episode, unspecified: Secondary | ICD-10-CM | POA: Diagnosis not present

## 2014-04-29 DIAGNOSIS — I898 Other specified noninfective disorders of lymphatic vessels and lymph nodes: Secondary | ICD-10-CM | POA: Diagnosis not present

## 2014-04-29 DIAGNOSIS — Z09 Encounter for follow-up examination after completed treatment for conditions other than malignant neoplasm: Secondary | ICD-10-CM | POA: Diagnosis not present

## 2014-04-29 DIAGNOSIS — Z9079 Acquired absence of other genital organ(s): Secondary | ICD-10-CM | POA: Diagnosis not present

## 2014-04-29 DIAGNOSIS — Z90710 Acquired absence of both cervix and uterus: Secondary | ICD-10-CM | POA: Diagnosis not present

## 2014-04-29 DIAGNOSIS — D6859 Other primary thrombophilia: Secondary | ICD-10-CM | POA: Diagnosis not present

## 2014-04-29 DIAGNOSIS — Z8542 Personal history of malignant neoplasm of other parts of uterus: Secondary | ICD-10-CM | POA: Diagnosis not present

## 2014-05-13 DIAGNOSIS — D6859 Other primary thrombophilia: Secondary | ICD-10-CM | POA: Diagnosis not present

## 2014-05-13 DIAGNOSIS — Z8542 Personal history of malignant neoplasm of other parts of uterus: Secondary | ICD-10-CM | POA: Diagnosis not present

## 2014-05-13 DIAGNOSIS — Z09 Encounter for follow-up examination after completed treatment for conditions other than malignant neoplasm: Secondary | ICD-10-CM | POA: Diagnosis not present

## 2014-05-13 DIAGNOSIS — Z90710 Acquired absence of both cervix and uterus: Secondary | ICD-10-CM | POA: Diagnosis not present

## 2014-05-13 DIAGNOSIS — I898 Other specified noninfective disorders of lymphatic vessels and lymph nodes: Secondary | ICD-10-CM | POA: Diagnosis not present

## 2014-05-13 DIAGNOSIS — Z9079 Acquired absence of other genital organ(s): Secondary | ICD-10-CM | POA: Diagnosis not present

## 2014-05-14 DIAGNOSIS — M47817 Spondylosis without myelopathy or radiculopathy, lumbosacral region: Secondary | ICD-10-CM | POA: Diagnosis not present

## 2014-05-14 DIAGNOSIS — N949 Unspecified condition associated with female genital organs and menstrual cycle: Secondary | ICD-10-CM | POA: Diagnosis not present

## 2014-05-14 DIAGNOSIS — N94819 Vulvodynia, unspecified: Secondary | ICD-10-CM | POA: Diagnosis not present

## 2014-05-14 DIAGNOSIS — G894 Chronic pain syndrome: Secondary | ICD-10-CM | POA: Diagnosis not present

## 2014-06-10 DIAGNOSIS — Z7901 Long term (current) use of anticoagulants: Secondary | ICD-10-CM | POA: Diagnosis not present

## 2014-06-10 DIAGNOSIS — Z5181 Encounter for therapeutic drug level monitoring: Secondary | ICD-10-CM | POA: Diagnosis not present

## 2014-06-16 ENCOUNTER — Encounter: Payer: Self-pay | Admitting: Vascular Surgery

## 2014-06-17 ENCOUNTER — Ambulatory Visit: Payer: Medicare Other | Admitting: Vascular Surgery

## 2014-06-17 ENCOUNTER — Other Ambulatory Visit (HOSPITAL_COMMUNITY): Payer: Medicare Other

## 2014-07-07 ENCOUNTER — Encounter: Payer: Self-pay | Admitting: Vascular Surgery

## 2014-07-07 DIAGNOSIS — Z7901 Long term (current) use of anticoagulants: Secondary | ICD-10-CM | POA: Diagnosis not present

## 2014-07-07 DIAGNOSIS — Z5181 Encounter for therapeutic drug level monitoring: Secondary | ICD-10-CM | POA: Diagnosis not present

## 2014-07-08 ENCOUNTER — Ambulatory Visit (HOSPITAL_COMMUNITY)
Admission: RE | Admit: 2014-07-08 | Discharge: 2014-07-08 | Disposition: A | Discharge status: Home / Self Care | Payer: Medicare Other | Source: Ambulatory Visit | Attending: Vascular Surgery | Admitting: Vascular Surgery

## 2014-07-08 ENCOUNTER — Encounter: Payer: Self-pay | Admitting: Vascular Surgery

## 2014-07-08 ENCOUNTER — Ambulatory Visit (INDEPENDENT_AMBULATORY_CARE_PROVIDER_SITE_OTHER): Payer: Medicare Other | Admitting: Vascular Surgery

## 2014-07-08 VITALS — BP 126/55 | HR 72 | Temp 98.0°F | Resp 16 | Ht 64.0 in | Wt 150.0 lb

## 2014-07-08 DIAGNOSIS — I2510 Atherosclerotic heart disease of native coronary artery without angina pectoris: Secondary | ICD-10-CM

## 2014-07-08 DIAGNOSIS — F17210 Nicotine dependence, cigarettes, uncomplicated: Secondary | ICD-10-CM

## 2014-07-08 DIAGNOSIS — Z48812 Encounter for surgical aftercare following surgery on the circulatory system: Secondary | ICD-10-CM | POA: Insufficient documentation

## 2014-07-08 DIAGNOSIS — K551 Chronic vascular disorders of intestine: Secondary | ICD-10-CM | POA: Diagnosis not present

## 2014-07-08 DIAGNOSIS — I771 Stricture of artery: Secondary | ICD-10-CM | POA: Diagnosis not present

## 2014-07-08 DIAGNOSIS — Z716 Tobacco abuse counseling: Secondary | ICD-10-CM | POA: Diagnosis not present

## 2014-07-08 DIAGNOSIS — Z720 Tobacco use: Secondary | ICD-10-CM | POA: Diagnosis not present

## 2014-07-08 NOTE — Addendum Note (Signed)
Addended by: Mena Goes on: 07/08/2014 04:52 PM   Modules accepted: Orders

## 2014-07-08 NOTE — Patient Instructions (Addendum)
Please review the tobacco cessation information given to you today. It lists many hints that are useful in your effort to stop smoking. The [place] Tobacco [facility]essation contact phone # is [phone] These nurses and advisors offer lots of FREE information and aids to help you quit.    The [place] Quit Smoking line #  2246426188, they will also assist you with programs designed to help you stop smoking.     Leota Jacobsen, RN [facility]   [phone] ext [phone]   Chronic Mesenteric Ischemia Mesenteric ischemia is a deficiency of blood in an area of the intestine supplied by an artery that supports the intestine. Chronic mesenteric ischemia, also called intestinal angina, is a long-term condition. It happens when an artery or vein that supports the intestine gradually becomes blocked or narrow, restricting the blood supply to the intestine. When the blood supply to the intestine is severely restricted, the intestines cannot function properly because needed oxygen cannot reach them.  CAUSES   Fatty deposits that build up in an artery or vein but have not yet restricted blood flow entirely.  Differences in some people's anatomy.  Rapid weight loss.  Weakened areas in blood vessel walls (aneurysms).  Swelling and inflammation of blood vessels (such as from fibromuscular dysplasia and arteritis).  Disorders of blood clotting.  Scarring and fibrosis of blood vessels after radiation therapy.  Blood vessel problems after drug use, such as use of cocaine. RISK FACTORS  Being female.  Being over age 40 with a history of coronary or vascular disease.  Smoking.  Congestive heart failure.  Diabetes.  High cholesterol.  High blood pressure (hypertension). SIGNS AND SYMPTOMS   Severe stomachache. Some people become fearful of eating because of pain.   Abdominal pain or cramps that develop about 30 minutes after a meal.   Abdominal pain after  eating that becomes worse over time.   Diarrhea.   Nausea.   Vomiting.   Bloating.   Weight loss. DIAGNOSIS  Chronic mesenteric ischemia is often diagnosed after the person's history is taken, a physical exam is done, and tests are taken. Tests may include:  Ultrasounds.  CT scans.  Angiography. This is an imaging test that uses a dye to obtain a picture of blood flow to the intestine.  Endoscopy. This involves putting a scope through the mouth, down the throat, and into the stomach and intestine to view the intestinal wall and take small tissue samples (biopsies).  Tonometry. In this test a tiny probe is passed through the mouth and into the stomach or intestine and left in place for 24 hours or more. It measures the output of carbon dioxide by the affected tissues. TREATMENT  Treatment may include:   Medicines to reduce blood clotting and increase blood flow.   Surgery to remove the blockage, repair arteries or veins, and restore blood flow. This may involve:   Angioplasty. This is surgery to widen the affected artery, reduce the blockage, and sometimes insert a small, mesh tube (stent).   Bypass surgery. This may be performed to bypass the blockage and reconnect healthy arteries or veins.   A stent in the affected area to help keep blocked arteries open. HOME CARE INSTRUCTIONS  Only take over-the-counter or prescription medicines as directed by your health care provider.   Keep all follow-up appointments as directed by your health care provider.   Prevent the condition from occurring by:  Doing regular exercise.  Keeping a healthy weight.  Keeping a healthy diet.  Managing cholesterol levels.  Keeping blood pressure and heart rhythm problems under control.  Not smoking. SEEK IMMEDIATE MEDICAL CARE IF:  You have severe abdominal pain.   You notice blood in your stool.   You have nausea, vomiting, or diarrhea.   You have a fever. MAKE SURE  YOU:  Understand these instructions.  Will watch your condition.  Will get help right away if you are not doing well or get worse. Document Released: 03/26/2011 Document Revised: 04/08/2013 Document Reviewed: 02/04/2013 [facility] Patient Information 2015 [place], [place]. This information is not intended to replace advice given to you by your health care provider. Make sure you discuss any questions you have with your health care provider.

## 2014-07-08 NOTE — Progress Notes (Addendum)
Patient is a 53 year old female who previously underwent aorta to superior mesenteric artery and celiac artery bypass in 2004. She subsequently had thrombectomy of the SMA portion of his bypass in 2008. 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 She denies any symptoms of abdominal pain. She denies food fear. She has developed what sounds like some neuropathy symptoms in her lower extremities after a recent hysterectomy for cervical cancer. She has been losing some weight around 5 pounds over the last 6 months. Unfortunately she continues to smoke. Greater than 3 minutes they were spent regarding smoking cessation counseling.  Review of systems: She denies fever or chills. She denies shortness of breath or chest pain.  Physical exam:  Filed Vitals:   07/08/14 0927  BP: 126/55  Pulse: 72  Temp: 98 F (36.7 C)  TempSrc: Oral  Resp: 16  Height: 5\' 4"  (1.626 m)  Weight: 150 lb (68.04 kg)  SpO2: 100%    Abdomen: Soft nontender nondistended well-healed midline scar no hernia no bruit Extremities: 2+ femoral dorsalis pedis pulses bilaterally  Data: Duplex ultrasound of her mesenteric bypass was performed today. This showed no increased velocities in the patent graft. CT angiogram from March 2015 is reviewed which shows a widely patent aorta to SMA bypass grafting chronic occlusion of the celiac artery  Assessment: Patent mesenteric bypass asymptomatic  Plan: Follow-up duplex ultrasound 1 year. Continue to try to quit smoking  Ruta Hinds, MD [facility]: [phone] Pager: [phone]

## 2014-07-09 DIAGNOSIS — M47816 Spondylosis without myelopathy or radiculopathy, lumbar region: Secondary | ICD-10-CM | POA: Diagnosis not present

## 2014-07-09 DIAGNOSIS — G894 Chronic pain syndrome: Secondary | ICD-10-CM | POA: Diagnosis not present

## 2014-07-09 DIAGNOSIS — R102 Pelvic and perineal pain: Secondary | ICD-10-CM | POA: Diagnosis not present

## 2014-07-09 DIAGNOSIS — M549 Dorsalgia, unspecified: Secondary | ICD-10-CM | POA: Diagnosis not present

## 2014-07-26 DIAGNOSIS — J40 Bronchitis, not specified as acute or chronic: Secondary | ICD-10-CM | POA: Diagnosis not present

## 2014-08-02 DIAGNOSIS — D689 Coagulation defect, unspecified: Secondary | ICD-10-CM | POA: Diagnosis not present

## 2014-08-02 DIAGNOSIS — Z7901 Long term (current) use of anticoagulants: Secondary | ICD-10-CM | POA: Diagnosis not present

## 2014-08-02 DIAGNOSIS — G894 Chronic pain syndrome: Secondary | ICD-10-CM | POA: Diagnosis not present

## 2014-08-02 DIAGNOSIS — Z5181 Encounter for therapeutic drug level monitoring: Secondary | ICD-10-CM | POA: Diagnosis not present

## 2014-08-09 DIAGNOSIS — D689 Coagulation defect, unspecified: Secondary | ICD-10-CM | POA: Diagnosis not present

## 2014-08-09 DIAGNOSIS — Z5181 Encounter for therapeutic drug level monitoring: Secondary | ICD-10-CM | POA: Diagnosis not present

## 2014-08-09 DIAGNOSIS — G894 Chronic pain syndrome: Secondary | ICD-10-CM | POA: Diagnosis not present

## 2014-08-09 DIAGNOSIS — Z7901 Long term (current) use of anticoagulants: Secondary | ICD-10-CM | POA: Diagnosis not present

## 2014-09-03 DIAGNOSIS — Z79899 Other long term (current) drug therapy: Secondary | ICD-10-CM | POA: Diagnosis not present

## 2014-09-03 DIAGNOSIS — M549 Dorsalgia, unspecified: Secondary | ICD-10-CM | POA: Diagnosis not present

## 2014-09-03 DIAGNOSIS — Z5181 Encounter for therapeutic drug level monitoring: Secondary | ICD-10-CM | POA: Diagnosis not present

## 2014-09-03 DIAGNOSIS — M4696 Unspecified inflammatory spondylopathy, lumbar region: Secondary | ICD-10-CM | POA: Diagnosis not present

## 2014-09-03 DIAGNOSIS — R102 Pelvic and perineal pain: Secondary | ICD-10-CM | POA: Diagnosis not present

## 2014-09-03 DIAGNOSIS — G894 Chronic pain syndrome: Secondary | ICD-10-CM | POA: Diagnosis not present

## 2014-09-08 DIAGNOSIS — I6529 Occlusion and stenosis of unspecified carotid artery: Secondary | ICD-10-CM | POA: Diagnosis not present

## 2014-09-08 DIAGNOSIS — M8580 Other specified disorders of bone density and structure, unspecified site: Secondary | ICD-10-CM | POA: Diagnosis not present

## 2014-09-08 DIAGNOSIS — F419 Anxiety disorder, unspecified: Secondary | ICD-10-CM | POA: Diagnosis not present

## 2014-09-08 DIAGNOSIS — Z1389 Encounter for screening for other disorder: Secondary | ICD-10-CM | POA: Diagnosis not present

## 2014-09-08 DIAGNOSIS — Z23 Encounter for immunization: Secondary | ICD-10-CM | POA: Diagnosis not present

## 2014-09-08 DIAGNOSIS — Z0001 Encounter for general adult medical examination with abnormal findings: Secondary | ICD-10-CM | POA: Diagnosis not present

## 2014-09-08 DIAGNOSIS — E039 Hypothyroidism, unspecified: Secondary | ICD-10-CM | POA: Diagnosis not present

## 2014-09-08 DIAGNOSIS — Z7901 Long term (current) use of anticoagulants: Secondary | ICD-10-CM | POA: Diagnosis not present

## 2014-09-08 DIAGNOSIS — I2510 Atherosclerotic heart disease of native coronary artery without angina pectoris: Secondary | ICD-10-CM | POA: Diagnosis not present

## 2014-09-08 DIAGNOSIS — F17210 Nicotine dependence, cigarettes, uncomplicated: Secondary | ICD-10-CM | POA: Diagnosis not present

## 2014-09-08 DIAGNOSIS — E782 Mixed hyperlipidemia: Secondary | ICD-10-CM | POA: Diagnosis not present

## 2014-09-08 DIAGNOSIS — I10 Essential (primary) hypertension: Secondary | ICD-10-CM | POA: Diagnosis not present

## 2014-09-08 DIAGNOSIS — F329 Major depressive disorder, single episode, unspecified: Secondary | ICD-10-CM | POA: Diagnosis not present

## 2014-09-30 DIAGNOSIS — M810 Age-related osteoporosis without current pathological fracture: Secondary | ICD-10-CM | POA: Diagnosis not present

## 2014-10-06 DIAGNOSIS — Z7901 Long term (current) use of anticoagulants: Secondary | ICD-10-CM | POA: Diagnosis not present

## 2014-10-29 DIAGNOSIS — R102 Pelvic and perineal pain: Secondary | ICD-10-CM | POA: Diagnosis not present

## 2014-10-29 DIAGNOSIS — M549 Dorsalgia, unspecified: Secondary | ICD-10-CM | POA: Diagnosis not present

## 2014-10-29 DIAGNOSIS — G894 Chronic pain syndrome: Secondary | ICD-10-CM | POA: Diagnosis not present

## 2014-10-29 DIAGNOSIS — M4696 Unspecified inflammatory spondylopathy, lumbar region: Secondary | ICD-10-CM | POA: Diagnosis not present

## 2014-11-04 DIAGNOSIS — Z7901 Long term (current) use of anticoagulants: Secondary | ICD-10-CM | POA: Diagnosis not present

## 2014-11-17 DIAGNOSIS — M8589 Other specified disorders of bone density and structure, multiple sites: Secondary | ICD-10-CM | POA: Diagnosis not present

## 2014-11-17 DIAGNOSIS — E559 Vitamin D deficiency, unspecified: Secondary | ICD-10-CM | POA: Diagnosis not present

## 2014-12-02 DIAGNOSIS — Z7901 Long term (current) use of anticoagulants: Secondary | ICD-10-CM | POA: Diagnosis not present

## 2014-12-09 DIAGNOSIS — Z7901 Long term (current) use of anticoagulants: Secondary | ICD-10-CM | POA: Diagnosis not present

## 2014-12-23 DIAGNOSIS — M5440 Lumbago with sciatica, unspecified side: Secondary | ICD-10-CM | POA: Diagnosis not present

## 2014-12-23 DIAGNOSIS — M4696 Unspecified inflammatory spondylopathy, lumbar region: Secondary | ICD-10-CM | POA: Diagnosis not present

## 2014-12-23 DIAGNOSIS — G894 Chronic pain syndrome: Secondary | ICD-10-CM | POA: Diagnosis not present

## 2014-12-23 DIAGNOSIS — R102 Pelvic and perineal pain: Secondary | ICD-10-CM | POA: Diagnosis not present

## 2015-01-06 DIAGNOSIS — Z7901 Long term (current) use of anticoagulants: Secondary | ICD-10-CM | POA: Diagnosis not present

## 2015-02-03 DIAGNOSIS — Z7901 Long term (current) use of anticoagulants: Secondary | ICD-10-CM | POA: Diagnosis not present

## 2015-02-23 DIAGNOSIS — M5440 Lumbago with sciatica, unspecified side: Secondary | ICD-10-CM | POA: Diagnosis not present

## 2015-02-23 DIAGNOSIS — G894 Chronic pain syndrome: Secondary | ICD-10-CM | POA: Diagnosis not present

## 2015-02-23 DIAGNOSIS — M4696 Unspecified inflammatory spondylopathy, lumbar region: Secondary | ICD-10-CM | POA: Diagnosis not present

## 2015-02-23 DIAGNOSIS — R102 Pelvic and perineal pain: Secondary | ICD-10-CM | POA: Diagnosis not present

## 2015-03-03 DIAGNOSIS — Z7901 Long term (current) use of anticoagulants: Secondary | ICD-10-CM | POA: Diagnosis not present

## 2015-03-09 DIAGNOSIS — I6529 Occlusion and stenosis of unspecified carotid artery: Secondary | ICD-10-CM | POA: Diagnosis not present

## 2015-03-09 DIAGNOSIS — I739 Peripheral vascular disease, unspecified: Secondary | ICD-10-CM | POA: Diagnosis not present

## 2015-03-09 DIAGNOSIS — C55 Malignant neoplasm of uterus, part unspecified: Secondary | ICD-10-CM | POA: Diagnosis not present

## 2015-03-09 DIAGNOSIS — E039 Hypothyroidism, unspecified: Secondary | ICD-10-CM | POA: Diagnosis not present

## 2015-03-09 DIAGNOSIS — I2510 Atherosclerotic heart disease of native coronary artery without angina pectoris: Secondary | ICD-10-CM | POA: Diagnosis not present

## 2015-03-09 DIAGNOSIS — E782 Mixed hyperlipidemia: Secondary | ICD-10-CM | POA: Diagnosis not present

## 2015-03-09 DIAGNOSIS — F329 Major depressive disorder, single episode, unspecified: Secondary | ICD-10-CM | POA: Diagnosis not present

## 2015-03-09 DIAGNOSIS — I10 Essential (primary) hypertension: Secondary | ICD-10-CM | POA: Diagnosis not present

## 2015-03-09 DIAGNOSIS — F419 Anxiety disorder, unspecified: Secondary | ICD-10-CM | POA: Diagnosis not present

## 2015-03-09 DIAGNOSIS — F17210 Nicotine dependence, cigarettes, uncomplicated: Secondary | ICD-10-CM | POA: Diagnosis not present

## 2015-03-31 DIAGNOSIS — Z7901 Long term (current) use of anticoagulants: Secondary | ICD-10-CM | POA: Diagnosis not present

## 2015-04-20 DIAGNOSIS — Z79899 Other long term (current) drug therapy: Secondary | ICD-10-CM | POA: Diagnosis not present

## 2015-04-20 DIAGNOSIS — Z5181 Encounter for therapeutic drug level monitoring: Secondary | ICD-10-CM | POA: Diagnosis not present

## 2015-04-20 DIAGNOSIS — G894 Chronic pain syndrome: Secondary | ICD-10-CM | POA: Diagnosis not present

## 2015-04-20 DIAGNOSIS — N94819 Vulvodynia, unspecified: Secondary | ICD-10-CM | POA: Diagnosis not present

## 2015-04-20 DIAGNOSIS — M5440 Lumbago with sciatica, unspecified side: Secondary | ICD-10-CM | POA: Diagnosis not present

## 2015-04-20 DIAGNOSIS — R102 Pelvic and perineal pain: Secondary | ICD-10-CM | POA: Diagnosis not present

## 2015-04-29 DIAGNOSIS — Z7901 Long term (current) use of anticoagulants: Secondary | ICD-10-CM | POA: Diagnosis not present

## 2015-05-20 DIAGNOSIS — Z7901 Long term (current) use of anticoagulants: Secondary | ICD-10-CM | POA: Diagnosis not present

## 2015-06-09 DIAGNOSIS — Z7982 Long term (current) use of aspirin: Secondary | ICD-10-CM | POA: Diagnosis not present

## 2015-06-09 DIAGNOSIS — Z7901 Long term (current) use of anticoagulants: Secondary | ICD-10-CM | POA: Diagnosis not present

## 2015-06-09 DIAGNOSIS — F329 Major depressive disorder, single episode, unspecified: Secondary | ICD-10-CM | POA: Diagnosis not present

## 2015-06-09 DIAGNOSIS — F419 Anxiety disorder, unspecified: Secondary | ICD-10-CM | POA: Diagnosis not present

## 2015-06-09 DIAGNOSIS — C541 Malignant neoplasm of endometrium: Secondary | ICD-10-CM | POA: Diagnosis not present

## 2015-06-09 DIAGNOSIS — N893 Dysplasia of vagina, unspecified: Secondary | ICD-10-CM | POA: Diagnosis not present

## 2015-06-09 DIAGNOSIS — Z951 Presence of aortocoronary bypass graft: Secondary | ICD-10-CM | POA: Diagnosis not present

## 2015-06-09 DIAGNOSIS — Z79899 Other long term (current) drug therapy: Secondary | ICD-10-CM | POA: Diagnosis not present

## 2015-06-09 DIAGNOSIS — M549 Dorsalgia, unspecified: Secondary | ICD-10-CM | POA: Diagnosis not present

## 2015-06-09 DIAGNOSIS — E0500 Thyrotoxicosis with diffuse goiter without thyrotoxic crisis or storm: Secondary | ICD-10-CM | POA: Diagnosis not present

## 2015-06-09 DIAGNOSIS — G4700 Insomnia, unspecified: Secondary | ICD-10-CM | POA: Diagnosis not present

## 2015-06-09 DIAGNOSIS — I10 Essential (primary) hypertension: Secondary | ICD-10-CM | POA: Diagnosis not present

## 2015-06-09 DIAGNOSIS — I2510 Atherosclerotic heart disease of native coronary artery without angina pectoris: Secondary | ICD-10-CM | POA: Diagnosis not present

## 2015-06-20 DIAGNOSIS — N94819 Vulvodynia, unspecified: Secondary | ICD-10-CM | POA: Diagnosis not present

## 2015-06-20 DIAGNOSIS — R102 Pelvic and perineal pain: Secondary | ICD-10-CM | POA: Diagnosis not present

## 2015-06-20 DIAGNOSIS — M5440 Lumbago with sciatica, unspecified side: Secondary | ICD-10-CM | POA: Diagnosis not present

## 2015-06-20 DIAGNOSIS — G894 Chronic pain syndrome: Secondary | ICD-10-CM | POA: Diagnosis not present

## 2015-07-01 DIAGNOSIS — J45909 Unspecified asthma, uncomplicated: Secondary | ICD-10-CM | POA: Diagnosis not present

## 2015-07-01 DIAGNOSIS — F17210 Nicotine dependence, cigarettes, uncomplicated: Secondary | ICD-10-CM | POA: Diagnosis not present

## 2015-07-01 DIAGNOSIS — J9801 Acute bronchospasm: Secondary | ICD-10-CM | POA: Diagnosis not present

## 2015-07-07 DIAGNOSIS — C541 Malignant neoplasm of endometrium: Secondary | ICD-10-CM | POA: Diagnosis not present

## 2015-07-07 DIAGNOSIS — Z7901 Long term (current) use of anticoagulants: Secondary | ICD-10-CM | POA: Diagnosis not present

## 2015-07-08 DIAGNOSIS — J209 Acute bronchitis, unspecified: Secondary | ICD-10-CM | POA: Diagnosis not present

## 2015-07-19 ENCOUNTER — Encounter: Payer: Self-pay | Admitting: Family

## 2015-07-20 DIAGNOSIS — J209 Acute bronchitis, unspecified: Secondary | ICD-10-CM | POA: Diagnosis not present

## 2015-07-21 ENCOUNTER — Ambulatory Visit: Payer: Medicare Other | Admitting: Family

## 2015-07-21 ENCOUNTER — Encounter (HOSPITAL_COMMUNITY): Payer: Medicare Other

## 2015-08-05 DIAGNOSIS — Z7901 Long term (current) use of anticoagulants: Secondary | ICD-10-CM | POA: Diagnosis not present

## 2015-08-19 DIAGNOSIS — G8929 Other chronic pain: Secondary | ICD-10-CM | POA: Diagnosis not present

## 2015-08-19 DIAGNOSIS — M5440 Lumbago with sciatica, unspecified side: Secondary | ICD-10-CM | POA: Diagnosis not present

## 2015-08-19 DIAGNOSIS — R102 Pelvic and perineal pain: Secondary | ICD-10-CM | POA: Diagnosis not present

## 2015-08-19 DIAGNOSIS — Z7901 Long term (current) use of anticoagulants: Secondary | ICD-10-CM | POA: Diagnosis not present

## 2015-08-19 DIAGNOSIS — D7582 Heparin induced thrombocytopenia (HIT): Secondary | ICD-10-CM | POA: Diagnosis not present

## 2015-08-19 DIAGNOSIS — D6859 Other primary thrombophilia: Secondary | ICD-10-CM | POA: Diagnosis not present

## 2015-08-19 DIAGNOSIS — F17210 Nicotine dependence, cigarettes, uncomplicated: Secondary | ICD-10-CM | POA: Diagnosis not present

## 2015-08-19 DIAGNOSIS — G894 Chronic pain syndrome: Secondary | ICD-10-CM | POA: Diagnosis not present

## 2015-09-02 DIAGNOSIS — D6859 Other primary thrombophilia: Secondary | ICD-10-CM | POA: Diagnosis not present

## 2015-09-02 DIAGNOSIS — Z5181 Encounter for therapeutic drug level monitoring: Secondary | ICD-10-CM | POA: Diagnosis not present

## 2015-09-02 DIAGNOSIS — Z7901 Long term (current) use of anticoagulants: Secondary | ICD-10-CM | POA: Diagnosis not present

## 2015-09-15 DIAGNOSIS — Z5181 Encounter for therapeutic drug level monitoring: Secondary | ICD-10-CM | POA: Diagnosis not present

## 2015-09-15 DIAGNOSIS — D6859 Other primary thrombophilia: Secondary | ICD-10-CM | POA: Diagnosis not present

## 2015-09-15 DIAGNOSIS — Z7901 Long term (current) use of anticoagulants: Secondary | ICD-10-CM | POA: Diagnosis not present

## 2015-09-22 DIAGNOSIS — Z7901 Long term (current) use of anticoagulants: Secondary | ICD-10-CM | POA: Diagnosis not present

## 2015-09-22 DIAGNOSIS — Z5181 Encounter for therapeutic drug level monitoring: Secondary | ICD-10-CM | POA: Diagnosis not present

## 2015-10-06 DIAGNOSIS — Z7901 Long term (current) use of anticoagulants: Secondary | ICD-10-CM | POA: Diagnosis not present

## 2015-10-06 DIAGNOSIS — Z5181 Encounter for therapeutic drug level monitoring: Secondary | ICD-10-CM | POA: Diagnosis not present

## 2015-10-14 DIAGNOSIS — R102 Pelvic and perineal pain: Secondary | ICD-10-CM | POA: Diagnosis not present

## 2015-10-14 DIAGNOSIS — N94819 Vulvodynia, unspecified: Secondary | ICD-10-CM | POA: Diagnosis not present

## 2015-10-14 DIAGNOSIS — M5440 Lumbago with sciatica, unspecified side: Secondary | ICD-10-CM | POA: Diagnosis not present

## 2015-10-14 DIAGNOSIS — G894 Chronic pain syndrome: Secondary | ICD-10-CM | POA: Diagnosis not present

## 2015-10-14 DIAGNOSIS — Z79899 Other long term (current) drug therapy: Secondary | ICD-10-CM | POA: Diagnosis not present

## 2015-10-14 DIAGNOSIS — Z5181 Encounter for therapeutic drug level monitoring: Secondary | ICD-10-CM | POA: Diagnosis not present

## 2015-10-28 ENCOUNTER — Encounter: Payer: Self-pay | Admitting: Family

## 2015-11-01 ENCOUNTER — Ambulatory Visit (HOSPITAL_COMMUNITY)
Admission: RE | Admit: 2015-11-01 | Discharge: 2015-11-01 | Disposition: A | Discharge status: Home / Self Care | Payer: Medicare Other | Source: Ambulatory Visit | Attending: Family | Admitting: Family

## 2015-11-01 ENCOUNTER — Ambulatory Visit (INDEPENDENT_AMBULATORY_CARE_PROVIDER_SITE_OTHER): Payer: Medicare Other | Admitting: Family

## 2015-11-01 ENCOUNTER — Encounter: Payer: Self-pay | Admitting: Family

## 2015-11-01 VITALS — BP 132/70 | HR 65 | Temp 97.5°F | Resp 16 | Ht 64.0 in | Wt 156.0 lb

## 2015-11-01 DIAGNOSIS — I708 Atherosclerosis of other arteries: Secondary | ICD-10-CM

## 2015-11-01 DIAGNOSIS — F17200 Nicotine dependence, unspecified, uncomplicated: Secondary | ICD-10-CM

## 2015-11-01 DIAGNOSIS — Z720 Tobacco use: Secondary | ICD-10-CM

## 2015-11-01 DIAGNOSIS — K551 Chronic vascular disorders of intestine: Secondary | ICD-10-CM

## 2015-11-01 DIAGNOSIS — Z48812 Encounter for surgical aftercare following surgery on the circulatory system: Secondary | ICD-10-CM

## 2015-11-01 DIAGNOSIS — I771 Stricture of artery: Secondary | ICD-10-CM

## 2015-11-01 DIAGNOSIS — I748 Embolism and thrombosis of other arteries: Secondary | ICD-10-CM | POA: Diagnosis not present

## 2015-11-01 NOTE — Patient Instructions (Signed)
Chronic Mesenteric Ischemia Mesenteric ischemia is a deficiency of blood in an area of the intestine supplied by an artery that supports the intestine. Chronic mesenteric ischemia, also called intestinal angina, is a long-term condition. It happens when an artery or vein that supports the intestine gradually becomes blocked or narrow, restricting the blood supply to the intestine. When the blood supply to the intestine is severely restricted, the intestines cannot function properly because needed oxygen cannot reach them.  CAUSES   Fatty deposits that build up in an artery or vein but have not yet restricted blood flow entirely.  Differences in some people's anatomy.  Rapid weight loss.  Weakened areas in blood vessel walls (aneurysms).  Swelling and inflammation of blood vessels (such as from fibromuscular dysplasia and arteritis).  Disorders of blood clotting.  Scarring and fibrosis of blood vessels after radiation therapy.  Blood vessel problems after drug use, such as use of cocaine. RISK FACTORS  Being female.  Being over age 50 with a history of coronary or vascular disease.  Smoking.  Congestive heart failure.  Diabetes.  High cholesterol.  High blood pressure (hypertension). SIGNS AND SYMPTOMS   Severe stomachache. Some people become fearful of eating because of pain.   Abdominal pain or cramps that develop about 30 minutes after a meal.   Abdominal pain after eating that becomes worse over time.   Diarrhea.   Nausea.   Vomiting.   Bloating.   Weight loss. DIAGNOSIS  Chronic mesenteric ischemia is often diagnosed after the person's history is taken, a physical exam is done, and tests are taken. Tests may include:  Ultrasounds.  CT scans.  Angiography. This is an imaging test that uses a dye to obtain a picture of blood flow to the intestine.  Endoscopy. This involves putting a scope through the mouth, down the throat, and into the stomach and  intestine to view the intestinal wall and take small tissue samples (biopsies).  Tonometry. In this test a tiny probe is passed through the mouth and into the stomach or intestine and left in place for 24 hours or more. It measures the output of carbon dioxide by the affected tissues. TREATMENT  Treatment may include:   Medicines to reduce blood clotting and increase blood flow.   Surgery to remove the blockage, repair arteries or veins, and restore blood flow. This may involve:   Angioplasty. This is surgery to widen the affected artery, reduce the blockage, and sometimes insert a small, mesh tube (stent).   Bypass surgery. This may be performed to bypass the blockage and reconnect healthy arteries or veins.   A stent in the affected area to help keep blocked arteries open. HOME CARE INSTRUCTIONS  Only take over-the-counter or prescription medicines as directed by your health care provider.   Keep all follow-up appointments as directed by your health care provider.   Prevent the condition from occurring by:  Doing regular exercise.  Keeping a healthy weight.  Keeping a healthy diet.  Managing cholesterol levels.  Keeping blood pressure and heart rhythm problems under control.  Not smoking. SEEK IMMEDIATE MEDICAL CARE IF:  You have severe abdominal pain.   You notice blood in your stool.   You have nausea, vomiting, or diarrhea.   You have a fever. MAKE SURE YOU:  Understand these instructions.  Will watch your condition.  Will get help right away if you are not doing well or get worse.   This information is not intended to replace advice   given to you by your health care provider. Make sure you discuss any questions you have with your health care provider.   Document Released: 03/26/2011 Document Revised: 04/08/2013 Document Reviewed: 02/04/2013 Elsevier Interactive Patient Education 2016 [employer].    Smoking Cessation, Tips for Success If you  are ready to quit smoking, congratulations! You have chosen to help yourself be healthier. Cigarettes bring nicotine, tar, carbon monoxide, and other irritants into your body. Your lungs, heart, and blood vessels will be able to work better without these poisons. There are many different ways to quit smoking. Nicotine gum, nicotine patches, a nicotine inhaler, or nicotine nasal spray can help with physical craving. Hypnosis, support groups, and medicines help break the habit of smoking. WHAT THINGS CAN I DO TO MAKE QUITTING EASIER?  Here are some tips to help you quit for good:  Pick a date when you will quit smoking completely. Tell all of your friends and family about your plan to quit on that date.  Do not try to slowly cut down on the number of cigarettes you are smoking. Pick a quit date and quit smoking completely starting on that day.  Throw away all cigarettes.   Clean and remove all ashtrays from your home, work, and car.  On a card, write down your reasons for quitting. Carry the card with you and read it when you get the urge to smoke.  Cleanse your body of nicotine. Drink enough water and fluids to keep your urine clear or pale yellow. Do this after quitting to flush the nicotine from your body.  Learn to predict your moods. Do not let a bad situation be your excuse to have a cigarette. Some situations in your life might tempt you into wanting a cigarette.  Never have "just one" cigarette. It leads to wanting another and another. Remind yourself of your decision to quit.  Change habits associated with smoking. If you smoked while driving or when feeling stressed, try other activities to replace smoking. Stand up when drinking your coffee. Brush your teeth after eating. Sit in a different chair when you read the paper. Avoid alcohol while trying to quit, and try to drink fewer caffeinated beverages. Alcohol and caffeine may urge you to smoke.  Avoid foods and drinks that can trigger  a desire to smoke, such as sugary or spicy foods and alcohol.  Ask people who smoke not to smoke around you.  Have something planned to do right after eating or having a cup of coffee. For example, plan to take a walk or exercise.  Try a relaxation exercise to calm you down and decrease your stress. Remember, you may be tense and nervous for the first 2 weeks after you quit, but this will pass.  Find new activities to keep your hands busy. Play with a pen, coin, or rubber band. Doodle or draw things on paper.  Brush your teeth right after eating. This will help cut down on the craving for the taste of tobacco after meals. You can also try mouthwash.   Use oral substitutes in place of cigarettes. Try using lemon drops, carrots, cinnamon sticks, or chewing gum. Keep them handy so they are available when you have the urge to smoke.  When you have the urge to smoke, try deep breathing.  Designate your home as a nonsmoking area.  If you are a heavy smoker, ask your health care provider about a prescription for nicotine chewing gum. It can ease your withdrawal from  nicotine.  Reward yourself. Set aside the cigarette money you save and buy yourself something nice.  Look for support from others. Join a support group or smoking cessation program. Ask someone at home or at work to help you with your plan to quit smoking.  Always ask yourself, "Do I need this cigarette or is this just a reflex?" Tell yourself, "Today, I choose not to smoke," or "I do not want to smoke." You are reminding yourself of your decision to quit.  Do not replace cigarette smoking with electronic cigarettes (commonly called e-cigarettes). The safety of e-cigarettes is unknown, and some may contain harmful chemicals.  If you relapse, do not give up! Plan ahead and think about what you will do the next time you get the urge to smoke. HOW WILL I FEEL WHEN I QUIT SMOKING? You may have symptoms of withdrawal because your body  is used to nicotine (the addictive substance in cigarettes). You may crave cigarettes, be irritable, feel very hungry, cough often, get headaches, or have difficulty concentrating. The withdrawal symptoms are only temporary. They are strongest when you first quit but will go away within 10-14 days. When withdrawal symptoms occur, stay in control. Think about your reasons for quitting. Remind yourself that these are signs that your body is healing and getting used to being without cigarettes. Remember that withdrawal symptoms are easier to treat than the major diseases that smoking can cause.  Even after the withdrawal is over, expect periodic urges to smoke. However, these cravings are generally short lived and will go away whether you smoke or not. Do not smoke! WHAT RESOURCES ARE AVAILABLE TO HELP ME QUIT SMOKING? Your health care provider can direct you to community resources or hospitals for support, which may include:  Group support.  Education.  Hypnosis.  Therapy.   This information is not intended to replace advice given to you by your health care provider. Make sure you discuss any questions you have with your health care provider.   Document Released: 05/04/2004 Document Revised: 08/27/2014 Document Reviewed: 01/22/2013 Elsevier Interactive Patient Education 2016 [employer].   Steps to Quit Smoking  Smoking tobacco can be harmful to your health and can affect almost every organ in your body. Smoking puts you, and those around you, at risk for developing many serious chronic diseases. Quitting smoking is difficult, but it is one of the best things that you can do for your health. It is never too late to quit. WHAT ARE THE BENEFITS OF QUITTING SMOKING? When you quit smoking, you lower your risk of developing serious diseases and conditions, such as:  Lung cancer or lung disease, such as COPD.  Heart disease.  Stroke.  Heart attack.  Infertility.  Osteoporosis and bone  fractures. Additionally, symptoms such as coughing, wheezing, and shortness of breath may get better when you quit. You may also find that you get sick less often because your body is stronger at fighting off colds and infections. If you are pregnant, quitting smoking can help to reduce your chances of having a baby of low birth weight. HOW DO I GET READY TO QUIT? When you decide to quit smoking, create a plan to make sure that you are successful. Before you quit:  Pick a date to quit. Set a date within the next two weeks to give you time to prepare.  Write down the reasons why you are quitting. Keep this list in places where you will see it often, such as on  your bathroom mirror or in your car or wallet.  Identify the people, places, things, and activities that make you want to smoke (triggers) and avoid them. Make sure to take these actions:  Throw away all cigarettes at home, at work, and in your car.  Throw away smoking accessories, such as Scientist, research (medical).  Clean your car and make sure to empty the ashtray.  Clean your home, including curtains and carpets.  Tell your family, friends, and coworkers that you are quitting. Support from your loved ones can make quitting easier.  Talk with your health care provider about your options for quitting smoking.  Find out what treatment options are covered by your health insurance. WHAT STRATEGIES CAN I USE TO QUIT SMOKING?  Talk with your healthcare provider about different strategies to quit smoking. Some strategies include:  Quitting smoking altogether instead of gradually lessening how much you smoke over a period of time. Research shows that quitting "cold [place]" is more successful than gradually quitting.  Attending in-person counseling to help you build problem-solving skills. You are more likely to have success in quitting if you attend several counseling sessions. Even short sessions of 10 minutes can be effective.  Finding  resources and support systems that can help you to quit smoking and remain smoke-free after you quit. These resources are most helpful when you use them often. They can include:  Online chats with a Social worker.  Telephone quitlines.  Printed Furniture conservator/restorer.  Support groups or group counseling.  Text messaging programs.  Mobile phone applications.  Taking medicines to help you quit smoking. (If you are pregnant or breastfeeding, talk with your health care provider first.) Some medicines contain nicotine and some do not. Both types of medicines help with cravings, but the medicines that include nicotine help to relieve withdrawal symptoms. Your health care provider may recommend:  Nicotine patches, gum, or lozenges.  Nicotine inhalers or sprays.  Non-nicotine medicine that is taken by mouth. Talk with your health care provider about combining strategies, such as taking medicines while you are also receiving in-person counseling. Using these two strategies together makes you more likely to succeed in quitting than if you used either strategy on its own. If you are pregnant or breastfeeding, talk with your health care provider about finding counseling or other support strategies to quit smoking. Do not take medicine to help you quit smoking unless told to do so by your health care provider. WHAT THINGS CAN I DO TO MAKE IT EASIER TO QUIT? Quitting smoking might feel overwhelming at first, but there is a lot that you can do to make it easier. Take these important actions:  Reach out to your family and friends and ask that they support and encourage you during this time. Call telephone quitlines, reach out to support groups, or work with a counselor for support.  Ask people who smoke to avoid smoking around you.  Avoid places that trigger you to smoke, such as bars, parties, or smoke-break areas at work.  Spend time around people who do not smoke.  Lessen stress in your life, because  stress can be a smoking trigger for some people. To lessen stress, try:  Exercising regularly.  Deep-breathing exercises.  Yoga.  Meditating.  Performing a body scan. This involves closing your eyes, scanning your body from head to toe, and noticing which parts of your body are particularly tense. Purposefully relax the muscles in those areas.  Download or purchase mobile phone or tablet apps (  applications) that can help you stick to your quit plan by providing reminders, tips, and encouragement. There are many free apps, such as QuitGuide from the [employer] Office manager for Disease Control and Prevention). You can find other support for quitting smoking (smoking cessation) through smokefree.gov and other websites. HOW WILL I FEEL WHEN I QUIT SMOKING? Within the first 24 hours of quitting smoking, you may start to feel some withdrawal symptoms. These symptoms are usually most noticeable 2-3 days after quitting, but they usually do not last beyond 2-3 weeks. Changes or symptoms that you might experience include:  Mood swings.  Restlessness, anxiety, or irritation.  Difficulty concentrating.  Dizziness.  Strong cravings for sugary foods in addition to nicotine.  Mild weight gain.  Constipation.  Nausea.  Coughing or a sore throat.  Changes in how your medicines work in your body.  A depressed mood.  Difficulty sleeping (insomnia). After the first 2-3 weeks of quitting, you may start to notice more positive results, such as:  Improved sense of smell and taste.  Decreased coughing and sore throat.  Slower heart rate.  Lower blood pressure.  Clearer skin.  The ability to breathe more easily.  Fewer sick days. Quitting smoking is very challenging for most people. Do not get discouraged if you are not successful the first time. Some people need to make many attempts to quit before they achieve long-term success. Do your best to stick to your quit plan, and talk with your health  care provider if you have any questions or concerns.   This information is not intended to replace advice given to you by your health care provider. Make sure you discuss any questions you have with your health care provider.   Document Released: 07/31/2001 Document Revised: 12/21/2014 Document Reviewed: 12/21/2014 Elsevier Interactive Patient Education [employer].

## 2015-11-01 NOTE — Progress Notes (Signed)
CC: Follow up Mesenteric Ischemia  History of Present Illness  Danielle Barrett is a 55 y.o. (Feb 07, 1961) female of Dr. Oneida Alar who is s/p aorta to superior mesenteric artery and celiac artery bypass in 2004. She subsequently had thrombectomy of the SMA portion of his bypass in 2008. 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 She denies any symptoms of abdominal pain. She denies food fear. She has developed what sounds like some neuropathy symptoms in her lower extremities after a recent hysterectomy for cervical cancer.   Unfortunately she continues to smoke.   CT angiogram from March 2015 showed a widely patent aorta to SMA bypass grafting, chronic occlusion of the celiac artery.  Review of systems: She denies fever or chills. She denies shortness of breath or chest pain.  She was recently diagnosed with osteoporosis, states she cannot remember to take the oral weekly medication for this. She denies a hx of DM, but her older sister has DM. She admits to smoking 1/2 to 1 ppd. She attends a pain management clinic for genital area and low back pain since multiple abdominal bx's for evaluation of cervical cancer.  She takes a statin. She takes warfarin and ASA for antithrombin 3 deficiency.  She had a coronary angioplasty in 2011, denies a hx of MI.  She denies any hx of stroke or TIA.   Past Medical History  Diagnosis Date  . Hypertension   . Asthma   . Coronary artery disease   . Grave's disease   . Grave's disease   . Hyperlipidemia   . Cancer ([place])   . Peripheral vascular disease ([place])   . Antithrombin 3 deficiency [facility])     Social History Social History  Substance Use Topics  . Smoking status: Current Every Day Smoker -- 1.00 packs/day    Types: Cigarettes  . Smokeless tobacco: Never Used  . Alcohol Use: 0.0 oz/week    0 Standard drinks or equivalent per week     Comment: occasional    Family History Family History  Problem Relation Age of Onset  .  Hypertension Mother   . Heart disease Mother   . Heart attack Mother   . Stroke Father   . Heart disease Father   . Diabetes Father   . Hypertension Father     Surgical History Past Surgical History  Procedure Laterality Date  . Abdominal hysterectomy    . Abdominal surgery    . Colon surgery    . Coronary artery angio  02/2010  . Aorto to celiac and superior mesenteric artery bypass  11/22/2003  . Thrombectomy of sma  01/2007    Allergies  Allergen Reactions  . Eggs Or Egg-Derived Products     Hives   . Heparin     Hit   . Penicillins     Hives   . Poultry Meal     Hives     Current Outpatient Prescriptions  Medication Sig Dispense Refill  . albuterol (PROVENTIL HFA;VENTOLIN HFA) 108 (90 BASE) MCG/ACT inhaler Inhale 2 puffs into the lungs every 6 (six) hours as needed. Shortness of breath    . ALPRAZolam (XANAX) 0.5 MG tablet Take 0.5 mg by mouth 3 (three) times daily.    Marland Kitchen aspirin EC 81 MG tablet Take 1 tablet (81 mg total) by mouth daily.    . bisacodyl (DULCOLAX) 5 MG EC tablet Take 5 mg by mouth daily.    . isosorbide mononitrate (IMDUR) 60 MG 24 hr tablet Take  30 mg by mouth daily.    Marland Kitchen levothyroxine (SYNTHROID, LEVOTHROID) 50 MCG tablet Take 50 mcg by mouth daily.    Marland Kitchen lisinopril-hydrochlorothiazide (PRINZIDE,ZESTORETIC) 20-25 MG per tablet Take 1 tablet by mouth every morning.    . methocarbamol (ROBAXIN) 750 MG tablet Take 750 mg by mouth 3 (three) times daily.    . metoprolol tartrate (LOPRESSOR) 25 MG tablet Take 25 mg by mouth daily.     Marland Kitchen morphine (MS CONTIN) 15 MG 12 hr tablet Take 15 mg by mouth 3 (three) times daily.    . nitroGLYCERIN (NITROQUICK) 0.4 MG SL tablet Place 0.4 mg under the tongue every 5 (five) minutes as needed. Chest pain    . pravastatin (PRAVACHOL) 40 MG tablet Take 40 mg by mouth daily.    Marland Kitchen Propylene Glycol (SYSTANE BALANCE OP) Apply 1 drop to eye as needed. For dry eyes    . warfarin (COUMADIN) 5 MG tablet Take 5-7.5 mg by mouth  daily. Monday Tuesday,wednesday, Friday is 5 mg. 7.5 mg on Thursday, Saturday, Sunday. Last INR check was on 10-23-13    . venlafaxine (EFFEXOR) 37.5 MG tablet Take 37.5 mg by mouth 2 (two) times daily. Reported on 11/01/2015     No current facility-administered medications for this visit.    ROS: see HPI for pertinent positives and negatives    Physical Examination  Filed Vitals:   11/01/15 0840  BP: 132/70  Pulse: 65  Temp: 97.5 F (36.4 C)  Resp: 16  Height: 5\' 4"  (1.626 m)  Weight: 156 lb (70.761 kg)  SpO2: 96%   Body mass index is 26.76 kg/(m^2).  General: A&O x 3, WDWN  Pulmonary: Sym exp, good air movt, CTAB, no rales, rhonchi, or wheezing.  Cardiac: RRR, Nl S1, S2, no detected murmur.  Vascular: Vessel Right Left  Radial 2+Palpable 2+Palpable  Carotid  without bruit  without bruit  Aorta Not palpable N/A  Femoral 1+Palpable 2+Palpable  Popliteal Not palpable Not palpable  PT 1+Palpable 1+Palpable  DP 2+Palpable Not Palpable   Gastrointestinal: soft, NTND, -G/R, - HSM, - palpable masses, - CVAT.  Musculoskeletal: M/S 5/5 in UE's  except 4/5 in LE's, Extremities without ischemic changes.  Neurologic: Pain and light touch intact in extremities, Motor exam as listed above. CN 2-12 intact.   Non-Invasive Vascular Imaging  Mesenteric Duplex (Date: 11/01/2015):  VASCULAR LAB EVALUATION    INDICATION: Follow up SMA graft    PREVIOUS INTERVENTION(S): Chronic mesenteric ischemia with bifurcated aorta to celiac trunk to SMA graft performed in 2005    DUPLEX EXAM:     FINDINGS: Proximal graft 103 cm/s, mid graft 144 cm/s, distal graft 107 cm/s, distal aorta 136 cm/s.    IMPRESSION:  1. Patent aorta to SMA graft, with a slight kink with possible small focal plaque noted.  No significant changes noted compared to previous ultrasound of 06-28-2014.       Medical Decision Making  Danielle Barrett is a 55 y.o. female who is s/p aorta to superior mesenteric artery and  celiac artery bypass in 2004. She subsequently had thrombectomy of the SMA portion of his bypass in 2008. 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 She denies any symptoms of abdominal pain. She denies food fear. She has developed what sounds like some neuropathy symptoms in her lower extremities after a recent hysterectomy for cervical cancer.   Unfortunately she continues to smoke.   CT angiogram from March 2015 showed a widely patent aorta to SMA bypass grafting,  chronic occlusion of the celiac artery.   Today's mesenteric artery duplex suggests a patent aorta to SMA graft, with a slight kink with possible small focal plaque.  No significant changes noted compared to previous ultrasound of 06-28-2014.    The patient was counseled re smoking cessation and given several free resources re smoking cessation.     Based on her exam and studies, I have offered the patient mesenteric duplex in 1 year.  I discussed in depth with the patient the nature of atherosclerosis, and emphasized the importance of maximal medical management including strict control of blood pressure, blood glucose, and lipid levels, obtaining regular exercise, and cessation of smoking.    The patient is aware that without maximal medical management the underlying atherosclerotic disease process will progress, limiting the benefit of any interventions. The patient is currently on a statin: pravastatin.   The patient is currently on an anti-platelet: ASA.  Thank you for allowing [place] to participate in this patient's care.  Clemon Chambers, RN, MSN, FNP-C [facility] Office: [phone]  Clinic MD: Early  11/01/2015, 8:50 AM

## 2015-11-03 DIAGNOSIS — Z5181 Encounter for therapeutic drug level monitoring: Secondary | ICD-10-CM | POA: Diagnosis not present

## 2015-11-03 DIAGNOSIS — Z7901 Long term (current) use of anticoagulants: Secondary | ICD-10-CM | POA: Diagnosis not present

## 2015-12-01 DIAGNOSIS — Z7901 Long term (current) use of anticoagulants: Secondary | ICD-10-CM | POA: Diagnosis not present

## 2015-12-09 DIAGNOSIS — G894 Chronic pain syndrome: Secondary | ICD-10-CM | POA: Diagnosis not present

## 2015-12-09 DIAGNOSIS — M5440 Lumbago with sciatica, unspecified side: Secondary | ICD-10-CM | POA: Diagnosis not present

## 2015-12-09 DIAGNOSIS — R102 Pelvic and perineal pain: Secondary | ICD-10-CM | POA: Diagnosis not present

## 2015-12-09 DIAGNOSIS — N94819 Vulvodynia, unspecified: Secondary | ICD-10-CM | POA: Diagnosis not present

## 2015-12-15 DIAGNOSIS — Z7901 Long term (current) use of anticoagulants: Secondary | ICD-10-CM | POA: Diagnosis not present

## 2016-01-12 DIAGNOSIS — Z7901 Long term (current) use of anticoagulants: Secondary | ICD-10-CM | POA: Diagnosis not present

## 2016-01-26 DIAGNOSIS — Z7901 Long term (current) use of anticoagulants: Secondary | ICD-10-CM | POA: Diagnosis not present

## 2016-01-27 DIAGNOSIS — R102 Pelvic and perineal pain: Secondary | ICD-10-CM | POA: Diagnosis not present

## 2016-01-27 DIAGNOSIS — N94819 Vulvodynia, unspecified: Secondary | ICD-10-CM | POA: Diagnosis not present

## 2016-01-27 DIAGNOSIS — M5440 Lumbago with sciatica, unspecified side: Secondary | ICD-10-CM | POA: Diagnosis not present

## 2016-01-27 DIAGNOSIS — G894 Chronic pain syndrome: Secondary | ICD-10-CM | POA: Diagnosis not present

## 2016-02-13 DIAGNOSIS — Z7901 Long term (current) use of anticoagulants: Secondary | ICD-10-CM | POA: Diagnosis not present

## 2016-03-08 DIAGNOSIS — Z7901 Long term (current) use of anticoagulants: Secondary | ICD-10-CM | POA: Diagnosis not present

## 2016-03-23 DIAGNOSIS — Z720 Tobacco use: Secondary | ICD-10-CM | POA: Diagnosis not present

## 2016-03-23 DIAGNOSIS — N94819 Vulvodynia, unspecified: Secondary | ICD-10-CM | POA: Diagnosis not present

## 2016-03-23 DIAGNOSIS — R102 Pelvic and perineal pain: Secondary | ICD-10-CM | POA: Diagnosis not present

## 2016-03-23 DIAGNOSIS — M5440 Lumbago with sciatica, unspecified side: Secondary | ICD-10-CM | POA: Diagnosis not present

## 2016-04-05 DIAGNOSIS — Z7901 Long term (current) use of anticoagulants: Secondary | ICD-10-CM | POA: Diagnosis not present

## 2016-04-12 DIAGNOSIS — Z7901 Long term (current) use of anticoagulants: Secondary | ICD-10-CM | POA: Diagnosis not present

## 2016-05-10 DIAGNOSIS — Z7901 Long term (current) use of anticoagulants: Secondary | ICD-10-CM | POA: Diagnosis not present

## 2016-05-18 DIAGNOSIS — N94819 Vulvodynia, unspecified: Secondary | ICD-10-CM | POA: Diagnosis not present

## 2016-05-18 DIAGNOSIS — R102 Pelvic and perineal pain: Secondary | ICD-10-CM | POA: Diagnosis not present

## 2016-05-18 DIAGNOSIS — Z79899 Other long term (current) drug therapy: Secondary | ICD-10-CM | POA: Diagnosis not present

## 2016-05-18 DIAGNOSIS — Z720 Tobacco use: Secondary | ICD-10-CM | POA: Diagnosis not present

## 2016-05-18 DIAGNOSIS — Z5181 Encounter for therapeutic drug level monitoring: Secondary | ICD-10-CM | POA: Diagnosis not present

## 2016-05-18 DIAGNOSIS — M5440 Lumbago with sciatica, unspecified side: Secondary | ICD-10-CM | POA: Diagnosis not present

## 2016-06-07 DIAGNOSIS — Z7901 Long term (current) use of anticoagulants: Secondary | ICD-10-CM | POA: Diagnosis not present

## 2016-07-05 DIAGNOSIS — Z7901 Long term (current) use of anticoagulants: Secondary | ICD-10-CM | POA: Diagnosis not present

## 2016-07-17 DIAGNOSIS — N94819 Vulvodynia, unspecified: Secondary | ICD-10-CM | POA: Diagnosis not present

## 2016-07-17 DIAGNOSIS — M5440 Lumbago with sciatica, unspecified side: Secondary | ICD-10-CM | POA: Diagnosis not present

## 2016-07-17 DIAGNOSIS — Z716 Tobacco abuse counseling: Secondary | ICD-10-CM | POA: Diagnosis not present

## 2016-07-17 DIAGNOSIS — R102 Pelvic and perineal pain: Secondary | ICD-10-CM | POA: Diagnosis not present

## 2016-08-02 DIAGNOSIS — Z7901 Long term (current) use of anticoagulants: Secondary | ICD-10-CM | POA: Diagnosis not present

## 2016-08-06 DIAGNOSIS — I739 Peripheral vascular disease, unspecified: Secondary | ICD-10-CM | POA: Diagnosis not present

## 2016-08-06 DIAGNOSIS — F17210 Nicotine dependence, cigarettes, uncomplicated: Secondary | ICD-10-CM | POA: Diagnosis not present

## 2016-08-06 DIAGNOSIS — C55 Malignant neoplasm of uterus, part unspecified: Secondary | ICD-10-CM | POA: Diagnosis not present

## 2016-08-06 DIAGNOSIS — E7800 Pure hypercholesterolemia, unspecified: Secondary | ICD-10-CM | POA: Diagnosis not present

## 2016-08-06 DIAGNOSIS — I10 Essential (primary) hypertension: Secondary | ICD-10-CM | POA: Diagnosis not present

## 2016-08-06 DIAGNOSIS — D6859 Other primary thrombophilia: Secondary | ICD-10-CM | POA: Diagnosis not present

## 2016-08-06 DIAGNOSIS — F419 Anxiety disorder, unspecified: Secondary | ICD-10-CM | POA: Diagnosis not present

## 2016-08-06 DIAGNOSIS — J45909 Unspecified asthma, uncomplicated: Secondary | ICD-10-CM | POA: Diagnosis not present

## 2016-08-06 DIAGNOSIS — E039 Hypothyroidism, unspecified: Secondary | ICD-10-CM | POA: Diagnosis not present

## 2016-08-06 DIAGNOSIS — I2510 Atherosclerotic heart disease of native coronary artery without angina pectoris: Secondary | ICD-10-CM | POA: Diagnosis not present

## 2016-08-06 DIAGNOSIS — I771 Stricture of artery: Secondary | ICD-10-CM | POA: Diagnosis not present

## 2016-08-08 DIAGNOSIS — N94819 Vulvodynia, unspecified: Secondary | ICD-10-CM | POA: Diagnosis not present

## 2016-08-08 DIAGNOSIS — R102 Pelvic and perineal pain: Secondary | ICD-10-CM | POA: Diagnosis not present

## 2016-08-08 DIAGNOSIS — M545 Low back pain: Secondary | ICD-10-CM | POA: Diagnosis not present

## 2016-08-08 DIAGNOSIS — M5440 Lumbago with sciatica, unspecified side: Secondary | ICD-10-CM | POA: Diagnosis not present

## 2016-08-08 DIAGNOSIS — G8929 Other chronic pain: Secondary | ICD-10-CM | POA: Diagnosis not present

## 2016-08-30 DIAGNOSIS — F17200 Nicotine dependence, unspecified, uncomplicated: Secondary | ICD-10-CM | POA: Diagnosis not present

## 2016-08-30 DIAGNOSIS — D7582 Heparin induced thrombocytopenia (HIT): Secondary | ICD-10-CM | POA: Diagnosis not present

## 2016-08-30 DIAGNOSIS — Z8542 Personal history of malignant neoplasm of other parts of uterus: Secondary | ICD-10-CM | POA: Diagnosis not present

## 2016-08-30 DIAGNOSIS — D6859 Other primary thrombophilia: Secondary | ICD-10-CM | POA: Diagnosis not present

## 2016-08-30 DIAGNOSIS — F17210 Nicotine dependence, cigarettes, uncomplicated: Secondary | ICD-10-CM | POA: Diagnosis not present

## 2016-08-30 DIAGNOSIS — Z5181 Encounter for therapeutic drug level monitoring: Secondary | ICD-10-CM | POA: Diagnosis not present

## 2016-08-30 DIAGNOSIS — Z7901 Long term (current) use of anticoagulants: Secondary | ICD-10-CM | POA: Diagnosis not present

## 2016-08-30 DIAGNOSIS — G894 Chronic pain syndrome: Secondary | ICD-10-CM | POA: Diagnosis not present

## 2016-08-30 DIAGNOSIS — Z803 Family history of malignant neoplasm of breast: Secondary | ICD-10-CM | POA: Diagnosis not present

## 2016-09-10 DIAGNOSIS — C549 Malignant neoplasm of corpus uteri, unspecified: Secondary | ICD-10-CM | POA: Diagnosis not present

## 2016-09-10 DIAGNOSIS — Z1231 Encounter for screening mammogram for malignant neoplasm of breast: Secondary | ICD-10-CM | POA: Diagnosis not present

## 2016-09-10 DIAGNOSIS — Z803 Family history of malignant neoplasm of breast: Secondary | ICD-10-CM | POA: Diagnosis not present

## 2016-09-27 DIAGNOSIS — Z7901 Long term (current) use of anticoagulants: Secondary | ICD-10-CM | POA: Diagnosis not present

## 2016-10-03 DIAGNOSIS — M5442 Lumbago with sciatica, left side: Secondary | ICD-10-CM | POA: Diagnosis not present

## 2016-10-03 DIAGNOSIS — M5440 Lumbago with sciatica, unspecified side: Secondary | ICD-10-CM | POA: Diagnosis not present

## 2016-10-03 DIAGNOSIS — M1288 Other specific arthropathies, not elsewhere classified, other specified site: Secondary | ICD-10-CM | POA: Diagnosis not present

## 2016-10-03 DIAGNOSIS — M545 Low back pain: Secondary | ICD-10-CM | POA: Diagnosis not present

## 2016-10-03 DIAGNOSIS — G8929 Other chronic pain: Secondary | ICD-10-CM | POA: Diagnosis not present

## 2016-10-25 DIAGNOSIS — G894 Chronic pain syndrome: Secondary | ICD-10-CM | POA: Diagnosis not present

## 2016-10-25 DIAGNOSIS — Z7901 Long term (current) use of anticoagulants: Secondary | ICD-10-CM | POA: Diagnosis not present

## 2016-10-26 ENCOUNTER — Encounter: Payer: Self-pay | Admitting: Family

## 2016-11-06 ENCOUNTER — Ambulatory Visit: Payer: Medicare Other | Admitting: Family

## 2016-11-06 ENCOUNTER — Inpatient Hospital Stay (HOSPITAL_COMMUNITY): Admission: RE | Admit: 2016-11-06 | Payer: Medicare Other | Source: Ambulatory Visit

## 2016-11-28 DIAGNOSIS — Z7901 Long term (current) use of anticoagulants: Secondary | ICD-10-CM | POA: Diagnosis not present

## 2016-11-28 DIAGNOSIS — G894 Chronic pain syndrome: Secondary | ICD-10-CM | POA: Diagnosis not present

## 2016-11-28 DIAGNOSIS — M4696 Unspecified inflammatory spondylopathy, lumbar region: Secondary | ICD-10-CM | POA: Diagnosis not present

## 2016-11-28 DIAGNOSIS — Z5181 Encounter for therapeutic drug level monitoring: Secondary | ICD-10-CM | POA: Diagnosis not present

## 2016-11-28 DIAGNOSIS — M5442 Lumbago with sciatica, left side: Secondary | ICD-10-CM | POA: Diagnosis not present

## 2016-11-28 DIAGNOSIS — N94819 Vulvodynia, unspecified: Secondary | ICD-10-CM | POA: Diagnosis not present

## 2016-11-28 DIAGNOSIS — Z79899 Other long term (current) drug therapy: Secondary | ICD-10-CM | POA: Diagnosis not present

## 2016-11-28 DIAGNOSIS — M545 Low back pain: Secondary | ICD-10-CM | POA: Diagnosis not present

## 2016-11-28 DIAGNOSIS — G8929 Other chronic pain: Secondary | ICD-10-CM | POA: Diagnosis not present

## 2016-12-13 DIAGNOSIS — R5383 Other fatigue: Secondary | ICD-10-CM | POA: Diagnosis not present

## 2016-12-13 DIAGNOSIS — I2510 Atherosclerotic heart disease of native coronary artery without angina pectoris: Secondary | ICD-10-CM | POA: Diagnosis not present

## 2016-12-13 DIAGNOSIS — F17210 Nicotine dependence, cigarettes, uncomplicated: Secondary | ICD-10-CM | POA: Diagnosis not present

## 2016-12-14 ENCOUNTER — Encounter (INDEPENDENT_AMBULATORY_CARE_PROVIDER_SITE_OTHER): Payer: Self-pay

## 2016-12-14 ENCOUNTER — Ambulatory Visit (INDEPENDENT_AMBULATORY_CARE_PROVIDER_SITE_OTHER): Payer: Medicare Other | Admitting: Cardiology

## 2016-12-14 ENCOUNTER — Encounter: Payer: Self-pay | Admitting: Cardiology

## 2016-12-14 VITALS — BP 136/40 | HR 91 | Ht 64.0 in | Wt 161.0 lb

## 2016-12-14 DIAGNOSIS — Z720 Tobacco use: Secondary | ICD-10-CM | POA: Diagnosis not present

## 2016-12-14 DIAGNOSIS — I771 Stricture of artery: Secondary | ICD-10-CM | POA: Diagnosis not present

## 2016-12-14 DIAGNOSIS — I209 Angina pectoris, unspecified: Secondary | ICD-10-CM

## 2016-12-14 DIAGNOSIS — I2510 Atherosclerotic heart disease of native coronary artery without angina pectoris: Secondary | ICD-10-CM

## 2016-12-14 DIAGNOSIS — K551 Chronic vascular disorders of intestine: Secondary | ICD-10-CM

## 2016-12-14 DIAGNOSIS — I739 Peripheral vascular disease, unspecified: Secondary | ICD-10-CM | POA: Diagnosis not present

## 2016-12-14 MED ORDER — DILTIAZEM HCL ER COATED BEADS 120 MG PO CP24: 120.0000 mg | ORAL_CAPSULE | Freq: Every day | ORAL | 6 refills | Status: DC

## 2016-12-14 NOTE — Patient Instructions (Signed)
Stop Metoprolol   Start Cardizem CD 120 mg daily    Your physician wants you to follow-up in: 4 months with Dr.Skains. You will receive a reminder letter in the mail two months in advance. If you don't receive a letter, please call our office to schedule the follow-up appointment.

## 2016-12-14 NOTE — Progress Notes (Signed)
Cardiology Office Note:    Date:  12/14/2016   ID:  Danielle Barrett, DOB [birth year], MRN [id number]  PCP:  Kandice Hams, MD  Cardiologist:  Candee Furbish, MD   Referring MD: [name], MD     History of Present Illness:    Danielle Barrett is a 56 y.o. female here for evaluation of chest pain at the request of Dr. [name].  Has been several years since I have seen her last, she had a cardiac catheterization with diagonal cutting balloon angioplasty in June 2011. No stent placed. Normal ejection fraction. 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 Oneida Alar. She is a history of heparin-induced thrombocytopenia and anxiety disorder.  Recently she has been feeling some chest discomfort which resolved with nitroglycerin. Radiating to neck. Woke up with this. NTG would help. Fall back to sleep.  Been extremely tired. Dog wakes her up scratching at night. 2 naps a day.   She has been on chronic warfarin therapy because of the previous mesenteric ischemia and celiac bypass. She also has been diagnosed with antithrombin III deficiency. She was last seen by vascular surgery on 11/01/15.  She continues to smoke. No prior history of stroke.  Past Medical History:  Diagnosis Date  . Antithrombin 3 deficiency ([place])   . Asthma   . Cancer ([place])   . Coronary artery disease   . Grave's disease   . Grave's disease   . Hyperlipidemia   . Hypertension   . Peripheral vascular disease [facility])     Past Surgical History:  Procedure Laterality Date  . ABDOMINAL HYSTERECTOMY    . ABDOMINAL SURGERY    . aorto to celiac and superior mesenteric artery bypass  11/22/2003  . COLON SURGERY    . coronary artery angio  02/2010  . thrombectomy of SMA  01/2007    Current Medications: Current Meds  Medication Sig  . albuterol (PROVENTIL HFA;VENTOLIN HFA) 108 (90 BASE) MCG/ACT  inhaler Inhale 2 puffs into the lungs every 6 (six) hours as needed. Shortness of breath  . ALPRAZolam (XANAX) 0.5 MG tablet Take 0.5 mg by mouth 3 (three) times daily as needed for anxiety or sleep.   Marland Kitchen aspirin EC 81 MG tablet Take 1 tablet (81 mg total) by mouth daily.  . bisacodyl (DULCOLAX) 5 MG EC tablet Take 5 mg by mouth daily.  . isosorbide mononitrate (IMDUR) 60 MG 24 hr tablet Take 30 mg by mouth daily.  Marland Kitchen levothyroxine (SYNTHROID, LEVOTHROID) 50 MCG tablet Take 50 mcg by mouth daily.  Marland Kitchen lisinopril-hydrochlorothiazide (PRINZIDE,ZESTORETIC) 20-25 MG per tablet Take 1 tablet by mouth every morning.  . methocarbamol (ROBAXIN) 750 MG tablet Take 750 mg by mouth 3 (three) times daily as needed for muscle spasms.   Marland Kitchen morphine (MS CONTIN) 15 MG 12 hr tablet Take 15 mg by mouth 3 (three) times daily.  . nitroGLYCERIN (NITROQUICK) 0.4 MG SL tablet Place 0.4 mg under the tongue every 5 (five) minutes as needed. Chest pain  . pravastatin (PRAVACHOL) 40 MG tablet Take 40 mg by mouth daily.  Marland Kitchen Propylene Glycol (SYSTANE BALANCE OP) Apply 1 drop to eye as needed. For dry eyes  . venlafaxine (EFFEXOR) 37.5 MG tablet Take 37.5 mg by mouth 2 (two) times daily. Reported on 11/01/2015  . warfarin (COUMADIN) 5 MG tablet Take 5-7.5 mg by mouth daily. Monday Tuesday,wednesday, Friday is 5 mg.  7.5 mg on Thursday, Saturday, Sunday. Last INR check was on 10-23-13  . [DISCONTINUED] metoprolol tartrate (LOPRESSOR) 25 MG tablet Take 25 mg by mouth daily.      Allergies:   Eggs or egg-derived products; Heparin; Penicillins; and Poultry meal   Social History   Social History  . Marital status: Married    Spouse name: N/A  . Number of children: N/A  . Years of education: N/A   Social History Main Topics  . Smoking status: Current Every Day Smoker    Packs/day: 1.00    Types: Cigarettes  . Smokeless tobacco: Never Used  . Alcohol use 0.0 oz/week     Comment: occasional  . Drug use: No  . Sexual activity:  Yes    Birth control/ protection: Surgical   Other Topics Concern  . None   Social History Narrative  . None     family history includes Diabetes in her father; Heart attack in her mother; Heart disease in her father and mother; Hypertension in her father and mother; Stroke in her father. ROS:   Please see the history of present illness.     All other systems reviewed and are negative.   EKGs/Labs/Other Studies Reviewed:    EKG:  EKG is  ordered today.  The ekg ordered today demonstrates 12/14/16-sinus rhythm nonspecific ST-T wave changes heart rate 91 bpm otherwise unremarkable. Personally viewed.  Recent Labs: No results found for requested labs within last 8760 hours.   Recent Lipid Panel    Component Value Date/Time   CHOL (H) 12/22/2009 0635    238        ATP III CLASSIFICATION:  <200     mg/dL   Desirable  200-239  mg/dL   Borderline High  >=240    mg/dL   High          TRIG 382 (H) 12/22/2009 0635   HDL 30 (L) 12/22/2009 0635   CHOLHDL 7.9 12/22/2009 0635   VLDL 76 (H) 12/22/2009 0635   LDLCALC (H) 12/22/2009 0635    132        Total Cholesterol/HDL:CHD Risk Coronary Heart Disease Risk Table                     Men   Women  1/2 Average Risk   3.4   3.3  Average Risk       5.0   4.4  2 X Average Risk   9.6   7.1  3 X Average Risk  23.4   11.0        Use the calculated Patient Ratio above and the CHD Risk Table to determine the patient's CHD Risk.        ATP III CLASSIFICATION (LDL):  <100     mg/dL   Optimal  100-129  mg/dL   Near or Above                    Optimal  130-159  mg/dL   Borderline  160-189  mg/dL   High  >190     mg/dL   Very High    Physical Exam:    VS:  BP (!) 136/40   Pulse 91   Ht 5\' 4"  (1.626 m)   Wt 161 lb (73 kg)   SpO2 98%   BMI 27.64 kg/m     Wt Readings from Last 3 Encounters:  12/14/16 161 lb (73 kg)  11/01/15 156 lb (70.8 kg)  07/08/14 150 lb (68 kg)     GEN:  Well nourished, well developed in no acute  distress HEENT: Normal NECK: No JVD; No carotid bruits LYMPHATICS: No lymphadenopathy CARDIAC: RRR, no murmurs, rubs, gallops RESPIRATORY:  Clear to auscultation without rales, wheezing or rhonchi  ABDOMEN: Soft, non-tender, non-distended MUSCULOSKELETAL:  No edema; No deformity  SKIN: Warm and dry NEUROLOGIC:  Alert and oriented x 3 PSYCHIATRIC:  Normal affect   Echocardiogram on 12/22/2009:  - Left ventricle: The cavity size was normal. Systolic function was  normal. The estimated ejection fraction was in the range of 55% to  60%. Wall motion was normal; there were no regional wall motion  abnormalities. Left ventricular diastolic function parameters were  normal. - Mitral valve: Mild regurgitation.  Cardiac catheterization 6/20 12-80 percent stenosis of marginal branch and mild LAD stenosis.  ASSESSMENT:    1. Angina pectoris ([place])   2. SMA stenosis ([place])   3. PVD (peripheral vascular disease) ([place])   4. Coronary artery disease involving native coronary artery of native heart without angina pectoris   5. Tobacco use    PLAN:    In order of problems listed above:  Chest pain/CAD/angina  - Prior cutting balloon angioplasty of obtuse marginal branch back in 2012.  - Occasional discomfort relieved with nitroglycerin.  - She has only been taking metoprolol tartrate 25 mg at night. Perhaps this is triggering to her fatigue. We will stop this and start diltiazem.  - Hopefully calcium channel blocker will help as an antianginal. Continue with Imdur 30 mg. Ranexa could be an option for her in the future.  - She is not experiencing this discomfort with any exertional activity. It only seems to happen periodically at night.   -If symptoms do not improve, I would consider stress test in the future.  PVD  - Prior mesenteric ischemia silly at bypass, Dr. Oneida Alar.  Chronic anticoagulation  - On warfarin and aspirin. Antithrombin III deficiency.  Tobacco use  - Counseled  on cessation today. Both she and her husband would like to quit together.    Medication Adjustments/Labs and Tests Ordered: Current medicines are reviewed at length with the patient today.  Concerns regarding medicines are outlined above. Labs and tests ordered and medication changes are outlined in the patient instructions below:  Patient Instructions  Stop Metoprolol   Start Cardizem CD 120 mg daily    Your physician wants you to follow-up in: 4 months with Dr.Riel Hirschman. You will receive a reminder letter in the mail two months in advance. If you don't receive a letter, please call our office to schedule the follow-up appointment.     Signed, Candee Furbish, MD  12/14/2016 2:35 PM    [facility]

## 2016-12-31 DIAGNOSIS — G894 Chronic pain syndrome: Secondary | ICD-10-CM | POA: Diagnosis not present

## 2016-12-31 DIAGNOSIS — Z7901 Long term (current) use of anticoagulants: Secondary | ICD-10-CM | POA: Diagnosis not present

## 2017-01-04 ENCOUNTER — Encounter: Payer: Self-pay | Admitting: Family

## 2017-01-10 ENCOUNTER — Ambulatory Visit (INDEPENDENT_AMBULATORY_CARE_PROVIDER_SITE_OTHER): Payer: Medicare Other | Admitting: Family

## 2017-01-10 ENCOUNTER — Ambulatory Visit (HOSPITAL_COMMUNITY)
Admission: RE | Admit: 2017-01-10 | Discharge: 2017-01-10 | Disposition: A | Discharge status: Home / Self Care | Payer: Medicare Other | Source: Ambulatory Visit | Attending: Vascular Surgery | Admitting: Vascular Surgery

## 2017-01-10 ENCOUNTER — Encounter: Payer: Self-pay | Admitting: Family

## 2017-01-10 VITALS — BP 117/71 | HR 68 | Temp 97.1°F | Resp 20 | Ht 64.0 in | Wt 164.0 lb

## 2017-01-10 DIAGNOSIS — I708 Atherosclerosis of other arteries: Secondary | ICD-10-CM

## 2017-01-10 DIAGNOSIS — F17200 Nicotine dependence, unspecified, uncomplicated: Secondary | ICD-10-CM

## 2017-01-10 DIAGNOSIS — K551 Chronic vascular disorders of intestine: Secondary | ICD-10-CM

## 2017-01-10 DIAGNOSIS — I771 Stricture of artery: Secondary | ICD-10-CM | POA: Diagnosis not present

## 2017-01-10 DIAGNOSIS — R0989 Other specified symptoms and signs involving the circulatory and respiratory systems: Secondary | ICD-10-CM | POA: Diagnosis not present

## 2017-01-10 DIAGNOSIS — Z48812 Encounter for surgical aftercare following surgery on the circulatory system: Secondary | ICD-10-CM | POA: Insufficient documentation

## 2017-01-10 NOTE — Patient Instructions (Signed)
Chronic Mesenteric Ischemia Mesenteric ischemia is poor blood flow (circulation) in the vessels that supply blood to the stomach, intestines, and liver (mesenteric organs). Chronic mesenteric ischemia, also called mesenteric angina or intestinal angina, is a long-term (chronic) condition. It happens when an artery or vein that provides blood to the mesenteric organs gradually becomes blocked or narrow, restricting the blood supply to the organs. When the blood supply is severely restricted, the mesenteric organs cannot work properly. What are the causes? This condition is commonly caused by fatty deposits that build up in an artery (plaque), which can narrow the artery and restrict blood flow. Other causes include:  Weakened areas in blood vessel walls (aneurysms).  Conditions that cause twisting or inflammation of blood vessels, such as fibromuscular dysplasia or arteritis.  A disorder in which blood clots form in the veins (venous thrombosis).  Scarring and thickening (fibrosis) of blood vessels caused by radiation therapy.  A tear in the aorta, the body's main artery (aortic dissection).  Blood vessel problems after illegal drug use, such as use of cocaine.  Tumors in the nervous system (neurofibromatosis).  Certain autoimmune diseases, such as lupus. What increases the risk? The following factors may make you more likely to develop this condition:  Being female.  Being over age 40, especially if you have a history of heart problems.  Smoking.  Congestive heart failure.  Irregular heartbeat (arrhythmia).  Having a history of heart attack or stroke.  Diabetes.  High cholesterol.  High blood pressure (hypertension).  Being overweight or obese.  Kidney disease (renal disease) requiring dialysis. What are the signs or symptoms? Symptoms of this condition include:  Abdomen (abdominal) pain or cramps that develop 15-60 minutes after a meal. This pain may last for 1-3  hours. Some people may develop a fear of eating because of this symptom.  Weight loss.  Diarrhea.  Bloody stool.  Nausea.  Vomiting.  Bloating.  Abdominal pain after stress or with exercise. How is this diagnosed? This condition is diagnosed based on:  Your medical history.  A physical exam.  Tests, such as:  Ultrasound.  CT scan.  Blood tests.  Urine tests.  An imaging test that involves injecting a dye into your arteries to show blood flow through blood vessels (angiogram). This can help to show if there are any blockages in the vessels that lead to the intestines.  Passing a small probe through the mouth and into the stomach to measure the output of carbon dioxide (gastric tonometry). This can help to indicate whether there is decreased blood flow to the stomach and intestines. How is this treated? This condition may be treated with:  Dietary changes such as eating smaller, low-fat, meals more frequently.  Lifestyle changes to treat underlying conditions that contribute to the disease, such as high cholesterol and high blood pressure.  Medicines to reduce blood clotting and increase blood flow.  Surgery to remove the blockage, repair arteries or veins, and restore blood flow. This may involve:  Angioplasty. This is surgery to widen the affected artery, reduce the blockage, and sometimes insert a small, mesh tube (stent).  Bypass surgery. This may be done to go around (bypass) the blockage and reconnect healthy arteries or veins.  Placing a stent in the affected area. This may be done to help keep blocked arteries open. Follow these instructions at home: Eating and drinking   Eat a heart-healthy diet. This includes fresh fruits and vegetables, whole grains, and lean proteins like chicken, fish, eggs,  and beans.  Avoid foods that contain a lot of:  Salt (sodium).  Sugar.  Saturated fat (such as red meat).  Trans fat (such as fried foods).  Stay  hydrated. Drink enough fluid to keep your urine clear or pale yellow. Lifestyle   Stay active and get regular exercise as told by your health care provider. Aim for 150 minutes of moderate activity or 75 minutes of vigorous activity a week. Ask your health care provider what activities and forms of exercise are safe for you.  Maintain a healthy weight.  Work with your health care provider to manage your cholesterol.  Manage any other health problems you have, such as high blood pressure, diabetes, or heart rhythm problems.  Do not use any products that contain nicotine or tobacco, such as cigarettes and e-cigarettes. If you need help quitting, ask your health care provider. General instructions   Take over-the-counter and prescription medicines only as told by your health care provider.  Keep all follow-up visits as told by your health care provider. This is important. Contact a health care provider if:  Your symptoms do not improve or they return after treatment.  You have a fever. Get help right away if:  You have severe abdominal pain.  You have severe chest pain.  You have shortness of breath.  You feel weak or dizzy.  You have palpitations.  You have numbness or weakness in your face, arm, or leg.  You are confused.  You have trouble speaking or people have trouble understanding what you are saying.  You are constipated.  You have trouble urinating.  You have blood in your stool.  You have severe nausea, vomiting, or persistent diarrhea. Summary  Mesenteric ischemia is poor circulation in the vessels that supply blood to the the stomach, intestines, and liver (mesenteric organs).  This condition happens when an artery or vein that provides blood to the mesenteric organs gradually becomes blocked or narrow, restricting the blood supply to the organs.  This condition is commonly caused by fatty deposits that build up in an artery (plaque), which can narrow the  artery and restrict blood flow.  You are more likely to develop this condition if you are over age 24 and have a history of heart problems, high blood pressure, diabetes, or high cholesterol.  This condition is usually treated with medicines, dietary and lifestyle changes, and surgery to remove the blockage, repair arteries or veins, and restore blood flow. This information is not intended to replace advice given to you by your health care provider. Make sure you discuss any questions you have with your health care provider. Document Released: 03/26/2011 Document Revised: 07/21/2016 Document Reviewed: 07/21/2016 Elsevier Interactive Patient Education  2017 [employer].      Steps to Quit Smoking Smoking tobacco can be bad for your health. It can also affect almost every organ in your body. Smoking puts you and people around you at risk for many serious long-lasting (chronic) diseases. Quitting smoking is hard, but it is one of the best things that you can do for your health. It is never too late to quit. What are the benefits of quitting smoking? When you quit smoking, you lower your risk for getting serious diseases and conditions. They can include:  Lung cancer or lung disease.  Heart disease.  Stroke.  Heart attack.  Not being able to have children (infertility).  Weak bones (osteoporosis) and broken bones (fractures). If you have coughing, wheezing, and shortness of breath,  those symptoms may get better when you quit. You may also get sick less often. If you are pregnant, quitting smoking can help to lower your chances of having a baby of low birth weight. What can I do to help me quit smoking? Talk with your doctor about what can help you quit smoking. Some things you can do (strategies) include:  Quitting smoking totally, instead of slowly cutting back how much you smoke over a period of time.  Going to in-person counseling. You are more likely to quit if you go to many  counseling sessions.  Using resources and support systems, such as:  Online chats with a Social worker.  Phone quitlines.  Printed Furniture conservator/restorer.  Support groups or group counseling.  Text messaging programs.  Mobile phone apps or applications.  Taking medicines. Some of these medicines may have nicotine in them. If you are pregnant or breastfeeding, do not take any medicines to quit smoking unless your doctor says it is okay. Talk with your doctor about counseling or other things that can help you. Talk with your doctor about using more than one strategy at the same time, such as taking medicines while you are also going to in-person counseling. This can help make quitting easier. What things can I do to make it easier to quit? Quitting smoking might feel very hard at first, but there is a lot that you can do to make it easier. Take these steps:  Talk to your family and friends. Ask them to support and encourage you.  Call phone quitlines, reach out to support groups, or work with a Social worker.  Ask people who smoke to not smoke around you.  Avoid places that make you want (trigger) to smoke, such as:  Bars.  Parties.  Smoke-break areas at work.  Spend time with people who do not smoke.  Lower the stress in your life. Stress can make you want to smoke. Try these things to help your stress:  Getting regular exercise.  Deep-breathing exercises.  Yoga.  Meditating.  Doing a body scan. To do this, close your eyes, focus on one area of your body at a time from head to toe, and notice which parts of your body are tense. Try to relax the muscles in those areas.  Download or buy apps on your mobile phone or tablet that can help you stick to your quit plan. There are many free apps, such as QuitGuide from the [employer] Office manager for Disease Control and Prevention). You can find more support from smokefree.gov and other websites. This information is not intended to replace advice  given to you by your health care provider. Make sure you discuss any questions you have with your health care provider. Document Released: 06/02/2009 Document Revised: 04/03/2016 Document Reviewed: 12/21/2014 Elsevier Interactive Patient Education  2017 [employer].

## 2017-01-10 NOTE — Progress Notes (Signed)
CC: Follow up Mesenteric Ischemia  History of Present Illness  Danielle Barrett is a 56 y.o. ([birth year]) female who is s/p aorta to superior mesenteric artery and celiac artery bypass in 2004 by Dr. Oneida Alar. She subsequently had thrombectomy of the SMA portion of his bypass in 2008. 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 She denies any symptoms of abdominal pain. She denies food fear. She has developed what sounds like some neuropathy symptoms in her lower extremities after a recent hysterectomy for cervical cancer.   Unfortunately she continues to smoke.   She weighed 156# at her visit with me on 11-01-15. She weighs 164# today, gained 8 pounds in 14 months.   CT angiogram from March 2015 showed a widely patent aorta to SMA bypass grafting, chronic occlusion of the celiac artery.  Review of systems: She denies fever or chills. She denies shortness of breath or chest pain.  She was recently diagnosed with osteoporosis, states she cannot remember to take the oral weekly medication for this. She denies a hx of DM, but she has several first degree relatives with DM and CVD.  She admits to smoking 1/2 to 1 ppd. She attends a pain management clinic for genital area and low back pain since multiple abdominal bx's for evaluation of cervical cancer.  She takes a statin. She takes warfarin and ASA for antithrombin 3 deficiency.  She had a coronary angioplasty in 2011, denies a hx of MI.  She denies any hx of stroke or TIA.   Past Medical History:  Diagnosis Date  . Antithrombin 3 deficiency ([place])   . Asthma   . Cancer ([place])   . Coronary artery disease   . Grave's disease   . Grave's disease   . Hyperlipidemia   . Hypertension   . Peripheral vascular disease [facility])     Social History Social History  Substance Use Topics  . Smoking status: Current Every Day Smoker    Packs/day: 1.00    Types: Cigarettes  . Smokeless tobacco: Never Used  . Alcohol use 0.0 oz/week   Comment: occasional    Family History Family History  Problem Relation Age of Onset  . Hypertension Mother   . Heart disease Mother   . Heart attack Mother   . Stroke Father   . Heart disease Father   . Diabetes Father   . Hypertension Father     Surgical History Past Surgical History:  Procedure Laterality Date  . ABDOMINAL HYSTERECTOMY    . ABDOMINAL SURGERY    . aorto to celiac and superior mesenteric artery bypass  11/22/2003  . COLON SURGERY    . coronary artery angio  02/2010  . thrombectomy of SMA  01/2007    Allergies  Allergen Reactions  . Amitriptyline Rash  . Topiramate Nausea And Vomiting    Heart races, almost "blacked out"  . Eggs Or Egg-Derived Products     Hives   . Heparin     Hit   . Penicillins     Hives   . Poultry Meal     Hives     Current Outpatient Prescriptions  Medication Sig Dispense Refill  . albuterol (PROVENTIL HFA;VENTOLIN HFA) 108 (90 BASE) MCG/ACT inhaler Inhale 2 puffs into the lungs every 6 (six) hours as needed. Shortness of breath    . ALPRAZolam (XANAX) 0.5 MG tablet Take 0.5 mg by mouth 3 (three) times daily as needed for anxiety or sleep.     Marland Kitchen aspirin  EC 81 MG tablet Take 1 tablet (81 mg total) by mouth daily.    . bisacodyl (DULCOLAX) 5 MG EC tablet Take 5 mg by mouth daily.    Marland Kitchen diltiazem (CARDIZEM CD) 120 MG 24 hr capsule Take 1 capsule (120 mg total) by mouth daily. 30 capsule 6  . isosorbide mononitrate (IMDUR) 60 MG 24 hr tablet Take 30 mg by mouth daily.    Marland Kitchen levothyroxine (SYNTHROID, LEVOTHROID) 50 MCG tablet Take 50 mcg by mouth daily.    Marland Kitchen lisinopril-hydrochlorothiazide (PRINZIDE,ZESTORETIC) 20-25 MG per tablet Take 1 tablet by mouth every morning.    . methocarbamol (ROBAXIN) 750 MG tablet Take 750 mg by mouth 3 (three) times daily as needed for muscle spasms.     Marland Kitchen morphine (MS CONTIN) 15 MG 12 hr tablet Take 15 mg by mouth 3 (three) times daily.    . nitroGLYCERIN (NITROQUICK) 0.4 MG SL tablet Place 0.4 mg  under the tongue every 5 (five) minutes as needed. Chest pain    . pravastatin (PRAVACHOL) 40 MG tablet Take 40 mg by mouth daily.    Marland Kitchen Propylene Glycol (SYSTANE BALANCE OP) Apply 1 drop to eye as needed. For dry eyes    . venlafaxine (EFFEXOR) 37.5 MG tablet Take 37.5 mg by mouth 2 (two) times daily. Reported on 11/01/2015    . warfarin (COUMADIN) 7.5 MG tablet Take 7 mg by mouth daily. Monday Tuesday,wednesday, Friday is 5 mg. 7.5 mg on Thursday, Saturday, Sunday. Last INR check was on 10-23-13     No current facility-administered medications for this visit.     ROS: see HPI for pertinent positives and negatives    Physical Examination  Vitals:   01/10/17 0956  BP: 117/71  Pulse: 68  Resp: 20  Temp: 97.1 F (36.2 C)  TempSrc: Oral  SpO2: 97%  Weight: 164 lb (74.4 kg)  Height: 5\' 4"  (1.626 m)   Body mass index is 28.15 kg/m.  General: A&O x 3, WDWN  Pulmonary: Sym exp, respirations are non labored, good air movt, CTAB, no rales, rhonchi, or wheezing.  Cardiac: RRR, Nl S1, S2, no detected murmur.  Vascular: Vessel Right Left  Radial 2+Palpable 2+Palpable  Carotid  without bruit  without bruit  Aorta Not palpable N/A  Femoral 1+Palpable 2+Palpable  Popliteal Not palpable Not palpable  PT 1+Palpable 1+Palpable  DP 2+Palpable Not Palpable   Gastrointestinal: soft, NTND, -G/R, - HSM, - palpable masses, - CVAT.  Musculoskeletal: M/S 5/5 in UE's  except 4/5 in LE's, Extremities without ischemic changes.  Neurologic: Pain and light touch intact in extremities, Motor exam as listed above. CN 2-12 intact    Medical Decision Making  Danielle Barrett is a 56 y.o. female who is s/p aorta to superior mesenteric artery and celiac artery bypass in 2004. She subsequently had thrombectomy of the SMA portion of his bypass in 2008. 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 She denies any symptoms of abdominal pain. She denies food fear. She has developed what sounds like  some neuropathy symptoms in her lower extremities after a recent hysterectomy for cervical cancer.   Unfortunately she continues to smoke.   She has a left carotid bruit with no hx of stroke or TIA, see Plan.   DATA CT angiogram from March 2015 showed a widely patent aorta to SMA bypass grafting, chronic occlusion of the celiac artery.   Today's mesenteric artery duplex suggests a patent aorta to SMA graft. Increase in the proximal graft velocity  since previous study of 11-01-15. Proximal graft: 254 cm/s Mid graft: 103 cm/s Distal graft: 139 cm/s Mid aorta: 107 cm/s    The patient was counseled re smoking cessation and given several free resources re smoking cessation.    Based on her exam and studies, I have offered the patient mesenteric duplex and bilateral carotid duplex in 6 months.   I advised pt to notify us if she develops food fear or post prandial abdominal pain.    I discussed in depth with the patient the nature of atherosclerosis, and emphasized the importance of maximal medical management including strict control of blood pressure, blood glucose, and lipid levels, obtaining regular exercise, and cessation of smoking.    The patient is aware that without maximal medical management the underlying atherosclerotic disease process will progress, limiting the benefit of any interventions. The patient is currently on a statin: pravastatin 40 mg daily.   The patient is currently on an anti-platelet: ASA 81 mg daily.  Thank you for allowing [place] to participate in this patient's care.  Clemon Chambers, RN, MSN, FNP-C [facility] Office: [phone]  Clinic MD: Oneida Alar  01/10/2017, 10:03 AM

## 2017-01-23 DIAGNOSIS — Z720 Tobacco use: Secondary | ICD-10-CM | POA: Diagnosis not present

## 2017-01-23 DIAGNOSIS — G894 Chronic pain syndrome: Secondary | ICD-10-CM | POA: Diagnosis not present

## 2017-01-23 DIAGNOSIS — N94819 Vulvodynia, unspecified: Secondary | ICD-10-CM | POA: Diagnosis not present

## 2017-01-23 DIAGNOSIS — G8929 Other chronic pain: Secondary | ICD-10-CM | POA: Diagnosis not present

## 2017-01-23 DIAGNOSIS — M545 Low back pain: Secondary | ICD-10-CM | POA: Diagnosis not present

## 2017-01-23 NOTE — Addendum Note (Signed)
Addended by: Lianne Cure A on: 01/23/2017 03:49 PM   Modules accepted: Orders

## 2017-01-24 DIAGNOSIS — Z7901 Long term (current) use of anticoagulants: Secondary | ICD-10-CM | POA: Diagnosis not present

## 2017-02-11 ENCOUNTER — Other Ambulatory Visit (HOSPITAL_COMMUNITY)
Admission: RE | Admit: 2017-02-11 | Discharge: 2017-02-11 | Disposition: A | Discharge status: Home / Self Care | Payer: Medicare Other | Source: Ambulatory Visit | Attending: Obstetrics and Gynecology | Admitting: Obstetrics and Gynecology

## 2017-02-11 ENCOUNTER — Other Ambulatory Visit: Payer: Self-pay | Admitting: Obstetrics and Gynecology

## 2017-02-11 DIAGNOSIS — Z124 Encounter for screening for malignant neoplasm of cervix: Secondary | ICD-10-CM | POA: Insufficient documentation

## 2017-02-11 DIAGNOSIS — Z9189 Other specified personal risk factors, not elsewhere classified: Secondary | ICD-10-CM | POA: Diagnosis not present

## 2017-02-11 DIAGNOSIS — Z8542 Personal history of malignant neoplasm of other parts of uterus: Secondary | ICD-10-CM | POA: Diagnosis not present

## 2017-02-15 LAB — CYTOLOGY - PAP
Diagnosis: UNDETERMINED — AB
HPV (WINDOPATH): NOT DETECTED

## 2017-02-21 DIAGNOSIS — G894 Chronic pain syndrome: Secondary | ICD-10-CM | POA: Diagnosis not present

## 2017-02-21 DIAGNOSIS — Z7901 Long term (current) use of anticoagulants: Secondary | ICD-10-CM | POA: Diagnosis not present

## 2017-03-18 DIAGNOSIS — I739 Peripheral vascular disease, unspecified: Secondary | ICD-10-CM | POA: Diagnosis not present

## 2017-03-18 DIAGNOSIS — Z1211 Encounter for screening for malignant neoplasm of colon: Secondary | ICD-10-CM | POA: Diagnosis not present

## 2017-03-18 DIAGNOSIS — C55 Malignant neoplasm of uterus, part unspecified: Secondary | ICD-10-CM | POA: Diagnosis not present

## 2017-03-18 DIAGNOSIS — K625 Hemorrhage of anus and rectum: Secondary | ICD-10-CM | POA: Diagnosis not present

## 2017-03-18 DIAGNOSIS — Z7901 Long term (current) use of anticoagulants: Secondary | ICD-10-CM | POA: Diagnosis not present

## 2017-03-18 DIAGNOSIS — I2510 Atherosclerotic heart disease of native coronary artery without angina pectoris: Secondary | ICD-10-CM | POA: Diagnosis not present

## 2017-03-18 DIAGNOSIS — R1030 Lower abdominal pain, unspecified: Secondary | ICD-10-CM | POA: Diagnosis not present

## 2017-03-18 DIAGNOSIS — D6859 Other primary thrombophilia: Secondary | ICD-10-CM | POA: Diagnosis not present

## 2017-03-18 DIAGNOSIS — K559 Vascular disorder of intestine, unspecified: Secondary | ICD-10-CM | POA: Diagnosis not present

## 2017-03-20 DIAGNOSIS — M4696 Unspecified inflammatory spondylopathy, lumbar region: Secondary | ICD-10-CM | POA: Diagnosis not present

## 2017-03-20 DIAGNOSIS — M545 Low back pain: Secondary | ICD-10-CM | POA: Diagnosis not present

## 2017-03-20 DIAGNOSIS — R102 Pelvic and perineal pain: Secondary | ICD-10-CM | POA: Diagnosis not present

## 2017-03-20 DIAGNOSIS — G8929 Other chronic pain: Secondary | ICD-10-CM | POA: Diagnosis not present

## 2017-03-20 DIAGNOSIS — Z720 Tobacco use: Secondary | ICD-10-CM | POA: Diagnosis not present

## 2017-03-21 DIAGNOSIS — Z7901 Long term (current) use of anticoagulants: Secondary | ICD-10-CM | POA: Diagnosis not present

## 2017-03-21 DIAGNOSIS — G894 Chronic pain syndrome: Secondary | ICD-10-CM | POA: Diagnosis not present

## 2017-04-11 ENCOUNTER — Other Ambulatory Visit: Payer: Self-pay | Admitting: Internal Medicine

## 2017-04-11 ENCOUNTER — Ambulatory Visit
Admission: RE | Admit: 2017-04-11 | Discharge: 2017-04-11 | Disposition: A | Discharge status: Home / Self Care | Payer: Medicare Other | Source: Ambulatory Visit | Attending: Internal Medicine | Admitting: Internal Medicine

## 2017-04-11 DIAGNOSIS — M25532 Pain in left wrist: Secondary | ICD-10-CM

## 2017-04-11 DIAGNOSIS — G8929 Other chronic pain: Secondary | ICD-10-CM | POA: Diagnosis not present

## 2017-04-11 DIAGNOSIS — M25531 Pain in right wrist: Secondary | ICD-10-CM | POA: Diagnosis not present

## 2017-04-11 DIAGNOSIS — S6992XA Unspecified injury of left wrist, hand and finger(s), initial encounter: Secondary | ICD-10-CM | POA: Diagnosis not present

## 2017-04-18 DIAGNOSIS — Z7901 Long term (current) use of anticoagulants: Secondary | ICD-10-CM | POA: Diagnosis not present

## 2017-04-18 DIAGNOSIS — G894 Chronic pain syndrome: Secondary | ICD-10-CM | POA: Diagnosis not present

## 2017-04-29 ENCOUNTER — Ambulatory Visit (INDEPENDENT_AMBULATORY_CARE_PROVIDER_SITE_OTHER): Payer: Medicare Other | Admitting: Cardiology

## 2017-04-29 ENCOUNTER — Encounter: Payer: Self-pay | Admitting: Cardiology

## 2017-04-29 VITALS — BP 130/70 | HR 80 | Ht 64.0 in | Wt 166.0 lb

## 2017-04-29 DIAGNOSIS — I209 Angina pectoris, unspecified: Secondary | ICD-10-CM | POA: Diagnosis not present

## 2017-04-29 DIAGNOSIS — I2510 Atherosclerotic heart disease of native coronary artery without angina pectoris: Secondary | ICD-10-CM | POA: Diagnosis not present

## 2017-04-29 DIAGNOSIS — I739 Peripheral vascular disease, unspecified: Secondary | ICD-10-CM

## 2017-04-29 DIAGNOSIS — I708 Atherosclerosis of other arteries: Secondary | ICD-10-CM

## 2017-04-29 DIAGNOSIS — I771 Stricture of artery: Secondary | ICD-10-CM

## 2017-04-29 DIAGNOSIS — F17200 Nicotine dependence, unspecified, uncomplicated: Secondary | ICD-10-CM

## 2017-04-29 DIAGNOSIS — K551 Chronic vascular disorders of intestine: Secondary | ICD-10-CM

## 2017-04-29 NOTE — Patient Instructions (Signed)

## 2017-04-29 NOTE — Progress Notes (Signed)
Cardiology Office Note:    Date:  04/29/2017   ID:  Danielle Barrett, DOB Mar 23, 1961, MRN [id number]  PCP:  [name], MD  Cardiologist:  Candee Furbish, MD   Referring MD: [name], MD     History of Present Illness:    Danielle Barrett is a 56 y.o. female here for follow up of chest pain at the request of Dr. [name].  She had a cardiac catheterization with diagonal cutting balloon angioplasty in June 2011. No stent placed. Normal ejection fraction. 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 Oneida Alar. Patent SMA graft on duplex.  She is a history of heparin-induced thrombocytopenia and anxiety disorder.  Had been feeling some chest discomfort back in 11/1016 which resolved with nitroglycerin. Radiating to neck. Woke up with this. NTG would help. Fall back to sleep.  Been extremely tired. Dog wakes her up scratching at night. 2 naps a day.   She has been on chronic warfarin therapy because of the previous mesenteric ischemia and celiac bypass. She also has been diagnosed with antithrombin III deficiency. She was last seen by vascular surgery on 01/10/17.  She continues to smoke. No prior history of stroke. Overall she seems to be feeling better, no significant chest discomfort. She did have some headaches on higher dose of isosorbide and this has been adjusted. No significant shortness of breath. Still struggling with smoking. She saw vascular as below.  Past Medical History:  Diagnosis Date  . Antithrombin 3 deficiency ([place])   . Asthma   . Cancer ([place])   . Coronary artery disease   . Grave's disease   . Grave's disease   . Hyperlipidemia   . Hypertension   . Peripheral vascular disease [facility])     Past Surgical History:  Procedure Laterality Date  . ABDOMINAL HYSTERECTOMY    . ABDOMINAL SURGERY    . aorto to celiac and superior mesenteric artery bypass   11/22/2003  . COLON SURGERY    . coronary artery angio  02/2010  . thrombectomy of SMA  01/2007    Current Medications: Current Meds  Medication Sig  . albuterol (PROVENTIL HFA;VENTOLIN HFA) 108 (90 BASE) MCG/ACT inhaler Inhale 2 puffs into the lungs every 6 (six) hours as needed. Shortness of breath  . ALPRAZolam (XANAX) 0.5 MG tablet Take 0.5 mg by mouth 3 (three) times daily as needed for anxiety or sleep.   Marland Kitchen aspirin EC 81 MG tablet Take 1 tablet (81 mg total) by mouth daily.  . bisacodyl (DULCOLAX) 5 MG EC tablet Take 5 mg by mouth daily.  Marland Kitchen diltiazem (CARDIZEM CD) 120 MG 24 hr capsule Take 1 capsule (120 mg total) by mouth daily.  . isosorbide mononitrate (IMDUR) 30 MG 24 hr tablet Take 15 mg by mouth daily.  Marland Kitchen levothyroxine (SYNTHROID, LEVOTHROID) 50 MCG tablet Take 50 mcg by mouth daily.  Marland Kitchen lisinopril-hydrochlorothiazide (PRINZIDE,ZESTORETIC) 20-25 MG per tablet Take 1 tablet by mouth every morning.  . methocarbamol (ROBAXIN) 750 MG tablet Take 750 mg by mouth 3 (three) times daily as needed for muscle spasms.   Marland Kitchen morphine (MS CONTIN) 15 MG 12 hr tablet Take 15 mg by mouth 3 (three) times daily.  . nitroGLYCERIN (NITROQUICK) 0.4 MG SL tablet Place 0.4 mg under the tongue every 5 (five) minutes as needed. Chest pain  . pravastatin (PRAVACHOL) 40 MG tablet Take 40 mg by  mouth daily.  Marland Kitchen Propylene Glycol (SYSTANE BALANCE OP) Apply 1 drop to eye as needed. For dry eyes  . venlafaxine (EFFEXOR) 37.5 MG tablet Take 37.5 mg by mouth 2 (two) times daily. Reported on 11/01/2015  . warfarin (COUMADIN) 7.5 MG tablet Take 7 mg by mouth daily. Monday Tuesday,wednesday, Friday is 5 mg. 7.5 mg on Thursday, Saturday, Sunday. Last INR check was on 10-23-13     Allergies:   Amitriptyline; Topiramate; Eggs or egg-derived products; Heparin; Penicillins; and Poultry meal   Social History   Social History  . Marital status: Married    Spouse name: N/A  . Number of children: N/A  . Years of  education: N/A   Social History Main Topics  . Smoking status: Current Every Day Smoker    Packs/day: 1.00    Types: Cigarettes  . Smokeless tobacco: Never Used  . Alcohol use 0.0 oz/week     Comment: occasional  . Drug use: No  . Sexual activity: Yes    Birth control/ protection: Surgical   Other Topics Concern  . None   Social History Narrative  . None     family history includes Diabetes in her father; Heart attack in her mother; Heart disease in her father and mother; Hypertension in her father and mother; Stroke in her father. ROS:   Please see the history of present illness.     All other systems reviewed and are negative.   EKGs/Labs/Other Studies Reviewed:    EKG:  EKG is  ordered today.  The ekg ordered today demonstrates 12/14/16-sinus rhythm nonspecific ST-T wave changes heart rate 91 bpm otherwise unremarkable. Personally viewed.  Recent Labs: No results found for requested labs within last 8760 hours.   LDL is 67, hemoglobin A1c 5.6  Recent Lipid Panel    Component Value Date/Time   CHOL (H) 12/22/2009 0635    238        ATP III CLASSIFICATION:  <200     mg/dL   Desirable  200-239  mg/dL   Borderline High  >=240    mg/dL   High          TRIG 382 (H) 12/22/2009 0635   HDL 30 (L) 12/22/2009 0635   CHOLHDL 7.9 12/22/2009 0635   VLDL 76 (H) 12/22/2009 0635   LDLCALC (H) 12/22/2009 0635    132        Total Cholesterol/HDL:CHD Risk Coronary Heart Disease Risk Table                     Men   Women  1/2 Average Risk   3.4   3.3  Average Risk       5.0   4.4  2 X Average Risk   9.6   7.1  3 X Average Risk  23.4   11.0        Use the calculated Patient Ratio above and the CHD Risk Table to determine the patient's CHD Risk.        ATP III CLASSIFICATION (LDL):  <100     mg/dL   Optimal  100-129  mg/dL   Near or Above                    Optimal  130-159  mg/dL   Borderline  160-189  mg/dL   High  >190     mg/dL   Very High    Physical Exam:     VS:  BP 130/70  Pulse 80   Ht 5\' 4"  (1.626 m)   Wt 166 lb (75.3 kg)   LMP  (LMP Unknown)   BMI 28.49 kg/m     Wt Readings from Last 3 Encounters:  04/29/17 166 lb (75.3 kg)  01/10/17 164 lb (74.4 kg)  12/14/16 161 lb (73 kg)     GEN: Well nourished, well developed, in no acute distress  HEENT: normal  Neck: no JVD, carotid bruits, or masses Cardiac: RRR; no murmurs, rubs, or gallops,no edema  Respiratory:  clear to auscultation bilaterally, normal work of breathing GI: soft, nontender, nondistended, + BS MS: no deformity or atrophy  Skin: warm and dry, no rash Neuro:  Alert and Oriented x 3, Strength and sensation are intact Psych: euthymic mood, full affect   Echocardiogram on 12/22/2009:  - Left ventricle: The cavity size was normal. Systolic function was  normal. The estimated ejection fraction was in the range of 55% to  60%. Wall motion was normal; there were no regional wall motion  abnormalities. Left ventricular diastolic function parameters were  normal. - Mitral valve: Mild regurgitation.  Cardiac catheterization 01/2011-80 percent stenosis of marginal branch and mild LAD stenosis.  ASSESSMENT:    1. SMA stenosis ([place])   2. Current smoker   3. Angina pectoris ([place])   4. Coronary artery disease involving native coronary artery of native heart without angina pectoris   5. PVD (peripheral vascular disease) ([place])    PLAN:    In order of problems listed above:  Chest pain/CAD/angina  - Prior cutting balloon angioplasty of obtuse marginal branch back in 2012.  - Occasional discomfort relieved with nitroglycerin.  - diltiazem.Had stopped the metoprolol previously in the past because of fatigue.  - Hopefully calcium channel blocker will help as an antianginal. Continue with Imdur 15 mg (she is taking half of the 30 mg, could not tolerate higher dose because of headaches). Ranexa could be an option for her in the future.  - She is not experiencing  this discomfort with any exertional activity. It only seems to happen periodically at night.  - Overall seems to be doing quite well with this medication regimen.  PVD  - Prior mesenteric ischemia at bypass, Dr. Oneida Alar. They are watching the increased velocity at the proximal portion of the graft.  Chronic anticoagulation  - On warfarin and aspirin. Antithrombin III deficiency.  - Checked in [place] at [place] on cessation today. Both she and her husband would like to quit together. Using the patches.    Medication Adjustments/Labs and Tests Ordered: Current medicines are reviewed at length with the patient today.  Concerns regarding medicines are outlined above. Labs and tests ordered and medication changes are outlined in the patient instructions below:  Patient Instructions  Medication Instructions:  The current medical regimen is effective;  continue present plan and medications.  Follow-Up: Follow up in 1 year with Dr. Marlou Porch.  You will receive a letter in the mail 2 months before you are due.  Please call us when you receive this letter to schedule your follow up appointment.  If you need a refill on your cardiac medications before your next appointment, please call your pharmacy.  Thank you for choosing [facility]!!        Signed, Candee Furbish, MD  04/29/2017 9:29 AM    [facility]

## 2017-05-15 DIAGNOSIS — N94819 Vulvodynia, unspecified: Secondary | ICD-10-CM | POA: Diagnosis not present

## 2017-05-15 DIAGNOSIS — M5440 Lumbago with sciatica, unspecified side: Secondary | ICD-10-CM | POA: Diagnosis not present

## 2017-05-15 DIAGNOSIS — G8929 Other chronic pain: Secondary | ICD-10-CM | POA: Diagnosis not present

## 2017-05-15 DIAGNOSIS — R102 Pelvic and perineal pain: Secondary | ICD-10-CM | POA: Diagnosis not present

## 2017-05-15 DIAGNOSIS — M545 Low back pain: Secondary | ICD-10-CM | POA: Diagnosis not present

## 2017-05-16 DIAGNOSIS — G894 Chronic pain syndrome: Secondary | ICD-10-CM | POA: Diagnosis not present

## 2017-05-16 DIAGNOSIS — Z7901 Long term (current) use of anticoagulants: Secondary | ICD-10-CM | POA: Diagnosis not present

## 2017-05-20 ENCOUNTER — Other Ambulatory Visit: Payer: Self-pay | Admitting: Gastroenterology

## 2017-05-20 DIAGNOSIS — R1030 Lower abdominal pain, unspecified: Secondary | ICD-10-CM

## 2017-05-30 ENCOUNTER — Ambulatory Visit
Admission: RE | Admit: 2017-05-30 | Discharge: 2017-05-30 | Disposition: A | Discharge status: Home / Self Care | Payer: Medicare Other | Source: Ambulatory Visit | Attending: Gastroenterology | Admitting: Gastroenterology

## 2017-05-30 DIAGNOSIS — R1030 Lower abdominal pain, unspecified: Secondary | ICD-10-CM

## 2017-05-31 DIAGNOSIS — Z7901 Long term (current) use of anticoagulants: Secondary | ICD-10-CM | POA: Diagnosis not present

## 2017-05-31 DIAGNOSIS — G894 Chronic pain syndrome: Secondary | ICD-10-CM | POA: Diagnosis not present

## 2017-07-03 ENCOUNTER — Other Ambulatory Visit: Payer: Self-pay | Admitting: Cardiology

## 2017-07-03 DIAGNOSIS — I209 Angina pectoris, unspecified: Secondary | ICD-10-CM

## 2017-07-25 ENCOUNTER — Ambulatory Visit (INDEPENDENT_AMBULATORY_CARE_PROVIDER_SITE_OTHER)
Admission: RE | Admit: 2017-07-25 | Discharge: 2017-07-25 | Disposition: A | Discharge status: Home / Self Care | Payer: Medicare Other | Source: Ambulatory Visit | Attending: Family | Admitting: Family

## 2017-07-25 ENCOUNTER — Encounter: Payer: Self-pay | Admitting: Family

## 2017-07-25 ENCOUNTER — Ambulatory Visit (INDEPENDENT_AMBULATORY_CARE_PROVIDER_SITE_OTHER): Payer: Medicare Other | Admitting: Family

## 2017-07-25 ENCOUNTER — Ambulatory Visit (HOSPITAL_COMMUNITY)
Admission: RE | Admit: 2017-07-25 | Discharge: 2017-07-25 | Disposition: A | Discharge status: Home / Self Care | Payer: Medicare Other | Source: Ambulatory Visit | Attending: Family | Admitting: Family

## 2017-07-25 VITALS — BP 155/73 | HR 69 | Temp 97.5°F | Resp 18 | Wt 165.8 lb

## 2017-07-25 DIAGNOSIS — I771 Stricture of artery: Secondary | ICD-10-CM

## 2017-07-25 DIAGNOSIS — R140 Abdominal distension (gaseous): Secondary | ICD-10-CM | POA: Insufficient documentation

## 2017-07-25 DIAGNOSIS — R0989 Other specified symptoms and signs involving the circulatory and respiratory systems: Secondary | ICD-10-CM

## 2017-07-25 DIAGNOSIS — I6523 Occlusion and stenosis of bilateral carotid arteries: Secondary | ICD-10-CM | POA: Insufficient documentation

## 2017-07-25 DIAGNOSIS — I708 Atherosclerosis of other arteries: Secondary | ICD-10-CM

## 2017-07-25 DIAGNOSIS — F17200 Nicotine dependence, unspecified, uncomplicated: Secondary | ICD-10-CM

## 2017-07-25 DIAGNOSIS — K551 Chronic vascular disorders of intestine: Secondary | ICD-10-CM

## 2017-07-25 DIAGNOSIS — G894 Chronic pain syndrome: Secondary | ICD-10-CM | POA: Diagnosis not present

## 2017-07-25 DIAGNOSIS — Z7901 Long term (current) use of anticoagulants: Secondary | ICD-10-CM | POA: Diagnosis not present

## 2017-07-25 LAB — VAS US CAROTID
LCCADSYS: 107 cm/s
LEFT ECA DIAS: -28 cm/s
LEFT VERTEBRAL DIAS: -14 cm/s
LICADSYS: -87 cm/s
Left CCA dist dias: 21 cm/s
Left CCA prox dias: 17 cm/s
Left CCA prox sys: 97 cm/s
Left ICA dist dias: -26 cm/s
Left ICA prox dias: 42 cm/s
Left ICA prox sys: 178 cm/s
RCCADSYS: -128 cm/s
RCCAPSYS: 100 cm/s
RIGHT CCA MID DIAS: 19 cm/s
RIGHT ECA DIAS: -16 cm/s
RIGHT VERTEBRAL DIAS: -20 cm/s
Right CCA prox dias: 13 cm/s

## 2017-07-25 NOTE — Patient Instructions (Addendum)
Steps to Quit Smoking Smoking tobacco can be bad for your health. It can also affect almost every organ in your body. Smoking puts you and people around you at risk for many serious long-lasting (chronic) diseases. Quitting smoking is hard, but it is one of the best things that you can do for your health. It is never too late to quit. What are the benefits of quitting smoking? When you quit smoking, you lower your risk for getting serious diseases and conditions. They can include:  Lung cancer or lung disease.  Heart disease.  Stroke.  Heart attack.  Not being able to have children (infertility).  Weak bones (osteoporosis) and broken bones (fractures).  If you have coughing, wheezing, and shortness of breath, those symptoms may get better when you quit. You may also get sick less often. If you are pregnant, quitting smoking can help to lower your chances of having a baby of low birth weight. What can I do to help me quit smoking? Talk with your doctor about what can help you quit smoking. Some things you can do (strategies) include:  Quitting smoking totally, instead of slowly cutting back how much you smoke over a period of time.  Going to in-person counseling. You are more likely to quit if you go to many counseling sessions.  Using resources and support systems, such as: ? Database administrator with a Social worker. ? Phone quitlines. ? Careers information officer. ? Support groups or group counseling. ? Text messaging programs. ? Mobile phone apps or applications.  Taking medicines. Some of these medicines may have nicotine in them. If you are pregnant or breastfeeding, do not take any medicines to quit smoking unless your doctor says it is okay. Talk with your doctor about counseling or other things that can help you.  Talk with your doctor about using more than one strategy at the same time, such as taking medicines while you are also going to in-person counseling. This can help make  quitting easier. What things can I do to make it easier to quit? Quitting smoking might feel very hard at first, but there is a lot that you can do to make it easier. Take these steps:  Talk to your family and friends. Ask them to support and encourage you.  Call phone quitlines, reach out to support groups, or work with a Social worker.  Ask people who smoke to not smoke around you.  Avoid places that make you want (trigger) to smoke, such as: ? Bars. ? Parties. ? Smoke-break areas at work.  Spend time with people who do not smoke.  Lower the stress in your life. Stress can make you want to smoke. Try these things to help your stress: ? Getting regular exercise. ? Deep-breathing exercises. ? Yoga. ? Meditating. ? Doing a body scan. To do this, close your eyes, focus on one area of your body at a time from head to toe, and notice which parts of your body are tense. Try to relax the muscles in those areas.  Download or buy apps on your mobile phone or tablet that can help you stick to your quit plan. There are many free apps, such as QuitGuide from the [employer] Office manager for Disease Control and Prevention). You can find more support from smokefree.gov and other websites.  This information is not intended to replace advice given to you by your health care provider. Make sure you discuss any questions you have with your health care provider. Document Released: 06/02/2009  Document Revised: 04/03/2016 Document Reviewed: 12/21/2014 Elsevier Interactive Patient Education  2018 [employer].     Chronic Mesenteric Ischemia Mesenteric ischemia is poor blood flow (circulation) in the vessels that supply blood to the stomach, intestines, and liver (mesenteric organs). Chronic mesenteric ischemia, also called mesenteric angina or intestinal angina, is a long-term (chronic) condition. It happens when an artery or vein that provides blood to the mesenteric organs gradually becomes blocked or narrow,  restricting the blood supply to the organs. When the blood supply is severely restricted, the mesenteric organs cannot work properly. What are the causes? This condition is commonly caused by fatty deposits that build up in an artery (plaque), which can narrow the artery and restrict blood flow. Other causes include:  Weakened areas in blood vessel walls (aneurysms).  Conditions that cause twisting or inflammation of blood vessels, such as fibromuscular dysplasia or arteritis.  A disorder in which blood clots form in the veins (venous thrombosis).  Scarring and thickening (fibrosis) of blood vessels caused by radiation therapy.  A tear in the aorta, the body's main artery (aortic dissection).  Blood vessel problems after illegal drug use, such as use of cocaine.  Tumors in the nervous system (neurofibromatosis).  Certain autoimmune diseases, such as lupus.  What increases the risk? The following factors may make you more likely to develop this condition:  Being female.  Being over age 12, especially if you have a history of heart problems.  Smoking.  Congestive heart failure.  Irregular heartbeat (arrhythmia).  Having a history of heart attack or stroke.  Diabetes.  High cholesterol.  High blood pressure (hypertension).  Being overweight or obese.  Kidney disease (renal disease) requiring dialysis.  What are the signs or symptoms? Symptoms of this condition include:  Abdomen (abdominal) pain or cramps that develop 15-60 minutes after a meal. This pain may last for 1-3 hours. Some people may develop a fear of eating because of this symptom.  Weight loss.  Diarrhea.  Bloody stool.  Nausea.  Vomiting.  Bloating.  Abdominal pain after stress or with exercise.  How is this diagnosed? This condition is diagnosed based on:  Your medical history.  A physical exam.  Tests, such as: ? Ultrasound. ? CT scan. ? Blood tests. ? Urine tests. ? An imaging  test that involves injecting a dye into your arteries to show blood flow through blood vessels (angiogram). This can help to show if there are any blockages in the vessels that lead to the intestines. ? Passing a small probe through the mouth and into the stomach to measure the output of carbon dioxide (gastric tonometry). This can help to indicate whether there is decreased blood flow to the stomach and intestines.  How is this treated? This condition may be treated with:  Dietary changes such as eating smaller, low-fat, meals more frequently.  Lifestyle changes to treat underlying conditions that contribute to the disease, such as high cholesterol and high blood pressure.  Medicines to reduce blood clotting and increase blood flow.  Surgery to remove the blockage, repair arteries or veins, and restore blood flow. This may involve: ? Angioplasty. This is surgery to widen the affected artery, reduce the blockage, and sometimes insert a small, mesh tube (stent). ? Bypass surgery. This may be done to go around (bypass) the blockage and reconnect healthy arteries or veins. ? Placing a stent in the affected area. This may be done to help keep blocked arteries open.  Follow these instructions at home:  Eating and drinking  Eat a heart-healthy diet. This includes fresh fruits and vegetables, whole grains, and lean proteins like chicken, fish, eggs, and beans.  Avoid foods that contain a lot of: ? Salt (sodium). ? Sugar. ? Saturated fat (such as red meat). ? Trans fat (such as fried foods).  Stay hydrated. Drink enough fluid to keep your urine clear or pale yellow. Lifestyle  Stay active and get regular exercise as told by your health care provider. Aim for 150 minutes of moderate activity or 75 minutes of vigorous activity a week. Ask your health care provider what activities and forms of exercise are safe for you.  Maintain a healthy weight.  Work with your health care provider to manage  your cholesterol.  Manage any other health problems you have, such as high blood pressure, diabetes, or heart rhythm problems.  Do not use any products that contain nicotine or tobacco, such as cigarettes and e-cigarettes. If you need help quitting, ask your health care provider. General instructions  Take over-the-counter and prescription medicines only as told by your health care provider.  Keep all follow-up visits as told by your health care provider. This is important. Contact a health care provider if:  Your symptoms do not improve or they return after treatment.  You have a fever. Get help right away if:  You have severe abdominal pain.  You have severe chest pain.  You have shortness of breath.  You feel weak or dizzy.  You have palpitations.  You have numbness or weakness in your face, arm, or leg.  You are confused.  You have trouble speaking or people have trouble understanding what you are saying.  You are constipated.  You have trouble urinating.  You have blood in your stool.  You have severe nausea, vomiting, or persistent diarrhea. Summary  Mesenteric ischemia is poor circulation in the vessels that supply blood to the the stomach, intestines, and liver (mesenteric organs).  This condition happens when an artery or vein that provides blood to the mesenteric organs gradually becomes blocked or narrow, restricting the blood supply to the organs.  This condition is commonly caused by fatty deposits that build up in an artery (plaque), which can narrow the artery and restrict blood flow.  You are more likely to develop this condition if you are over age 79 and have a history of heart problems, high blood pressure, diabetes, or high cholesterol.  This condition is usually treated with medicines, dietary and lifestyle changes, and surgery to remove the blockage, repair arteries or veins, and restore blood flow. This information is not intended to replace  advice given to you by your health care provider. Make sure you discuss any questions you have with your health care provider. Document Released: 03/26/2011 Document Revised: 07/21/2016 Document Reviewed: 07/21/2016 Elsevier Interactive Patient Education  2017 [employer].

## 2017-07-25 NOTE — Progress Notes (Signed)
CC: Follow up Mesenteric Ischemia, extracranial carotid artery stenosis   History of Present Illness  Danielle Barrett is a 56 y.o. (11/04/1960) female who is s/p aorta to superior mesenteric artery and celiac artery bypass in 2004 by Dr. Oneida Alar. She subsequently had thrombectomy of the SMA portion of his bypass in 2008. 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   She has developed what sounds like some neuropathy symptoms in her lower extremities after a recent hysterectomy for cervical cancer.  Unfortunately she continues to smoke.   She weighed 156# at her visit with me on 11-01-15. She weighs 165# today, was164# in May 2018. She reports developing slight early satiety with slight abdominal discomfort about October 2018, has not worsened. She reports known hx of excess intestinal gas.  CT angiogram from March 2015 showed a widely patent aorta to SMA bypass grafting, chronic occlusion of the celiac artery.  Review of systems: She denies fever or chills. She denies shortness of breath or chest pain.  She has osteoporosis. She denies a hx of DM, but she has several first degree relatives with DM and CVD.  She admits to smoking 1/2 to 1 ppd. She attends a pain management clinic for genital area and low back pain since multiple abdominal bx's for evaluation of cervical cancer.  She takes a statin. She takes warfarin and ASA for antithrombin 3 deficiency.  She had a coronary angioplasty in 2011, denies a hx of MI.  She denies any hx of stroke or TIA.  Past Medical History:  Diagnosis Date  . Antithrombin 3 deficiency ([place])   . Asthma   . Cancer ([place])   . Coronary artery disease   . Grave's disease   . Grave's disease   . Hyperlipidemia   . Hypertension   . Peripheral vascular disease [facility])     Social History Social History   Tobacco Use  . Smoking status: Current Every Day Smoker    Packs/day: 1.00    Types: Cigarettes  . Smokeless tobacco: Never Used    Substance Use Topics  . Alcohol use: Yes    Alcohol/week: 0.0 oz    Comment: occasional  . Drug use: No    Family History Family History  Problem Relation Age of Onset  . Hypertension Mother   . Heart disease Mother   . Heart attack Mother   . Stroke Father   . Heart disease Father   . Diabetes Father   . Hypertension Father     Surgical History Past Surgical History:  Procedure Laterality Date  . ABDOMINAL HYSTERECTOMY    . ABDOMINAL SURGERY    . aorto to celiac and superior mesenteric artery bypass  11/22/2003  . COLON SURGERY    . coronary artery angio  02/2010  . thrombectomy of SMA  01/2007    Allergies  Allergen Reactions  . Amitriptyline Rash  . Topiramate Nausea And Vomiting    Heart races, almost "blacked out"  . Eggs Or Egg-Derived Products     Hives   . Heparin     Hit   . Penicillins     Hives   . Poultry Meal     Hives     Current Outpatient Medications  Medication Sig Dispense Refill  . albuterol (PROVENTIL HFA;VENTOLIN HFA) 108 (90 BASE) MCG/ACT inhaler Inhale 2 puffs into the lungs every 6 (six) hours as needed. Shortness of breath    . ALPRAZolam (XANAX) 0.5 MG tablet Take 0.5 mg  by mouth 3 (three) times daily as needed for anxiety or sleep.     Marland Kitchen aspirin EC 81 MG tablet Take 1 tablet (81 mg total) by mouth daily.    . bisacodyl (DULCOLAX) 5 MG EC tablet Take 5 mg by mouth daily.    Marland Kitchen diltiazem (CARDIZEM CD) 120 MG 24 hr capsule TAKE ONE CAPSULE BY MOUTH EVERY DAY 30 capsule 9  . isosorbide mononitrate (IMDUR) 30 MG 24 hr tablet Take 15 mg by mouth daily.    Marland Kitchen levothyroxine (SYNTHROID, LEVOTHROID) 50 MCG tablet Take 50 mcg by mouth daily.    Marland Kitchen lisinopril-hydrochlorothiazide (PRINZIDE,ZESTORETIC) 20-25 MG per tablet Take 1 tablet by mouth every morning.    . methocarbamol (ROBAXIN) 750 MG tablet Take 750 mg by mouth 3 (three) times daily as needed for muscle spasms.     Marland Kitchen morphine (MS CONTIN) 15 MG 12 hr tablet Take 15 mg by mouth 3  (three) times daily.    . nitroGLYCERIN (NITROQUICK) 0.4 MG SL tablet Place 0.4 mg under the tongue every 5 (five) minutes as needed. Chest pain    . Propylene Glycol (SYSTANE BALANCE OP) Apply 1 drop to eye as needed. For dry eyes    . simvastatin (ZOCOR) 40 MG tablet every evening.  6  . venlafaxine (EFFEXOR) 37.5 MG tablet Take 37.5 mg by mouth 2 (two) times daily. Reported on 11/01/2015    . warfarin (COUMADIN) 7.5 MG tablet Take 7 mg by mouth daily. Monday Tuesday,wednesday, Friday is 5 mg. 7.5 mg on Thursday, Saturday, Sunday. Last INR check was on 10-23-13     No current facility-administered medications for this visit.     ROS: see HPI for pertinent positives and negatives    Physical Examination  Vitals:   07/25/17 0931 07/25/17 0933  BP: (!) 143/72 (!) 155/73  Pulse: 69   Resp: 18   Temp: (!) 97.5 F (36.4 C)   TempSrc: Oral   SpO2: 95%   Weight: 165 lb 12.8 oz (75.2 kg)    Body mass index is 28.46 kg/m.  General: A&O x 3, WDWN  Pulmonary: Sym exp, respirations are non labored, good air movement in all fields, CTAB, no rales, rhonchi, or wheezing.  Cardiac: Regular rhythm and rate, no detected murmur.  Vascular: Vessel Right Left  Radial 2+Palpable 2+Palpable  Carotid without bruit with bruit  Aorta Not palpable N/A  Femoral 1+Palpable 2+Palpable  Popliteal Not palpable Not palpable  PT 2+Palpable 2+Palpable  DP 2+Palpable Not Palpable   Gastrointestinal: soft, NTND, -G/R, - HSM, - palpable masses, - CVAT.  Musculoskeletal: M/S 5/5 in UE's except 4/5 in LE's, Extremities without ischemic changes.  Skin: No rash, no cellulitis, no ulcers noted.   Neurologic: Pain and light touch intact in extremities, Motor exam as listed above. CN 2-12 intact     Medical Decision Making  Danielle Barrett is a 56 y.o. female who who is s/p aorta to superior mesenteric artery and celiac artery bypass in 2004. She subsequently had thrombectomy of the SMA portion of  his bypass in 2008. 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  She reports developing slight early satiety with slight abdominal discomfort about October 2018, has not worsened. She has developed what sounds like some neuropathy symptoms in her lower extremities after a hysterectomy for cervical cancer, attends a pain management clinic.  Unfortunately she continues to smoke.   She has a left carotid bruit with no hx of stroke or TIA.  DATA  Carotid Duplex (07/25/17): Bilateral ICA: 40-59% stenosis. Bilateral EC with >50% stenosis.  No prior exam at this facility for comparison.   Mesenteric artery duplex (07/25/17): Patent aorta to SMA graft. Increase in the proximal graft velocity since previous study on 01-10-17. Proximal graft: 302 cm/s (was 254 cm/s on 01-10-17) Mid graft: 179 cm/s Distal graft: not visualized (was139 cm/s) Distal aorta: 281 cm/s (was107 cm/s) Suboptimal view due to excessive bowel gas, scar tissue, and breathing artifact.  Increased velocity in what is thought to be the proximal graft with limited visibility distally, tjis appears to have increased since the prior exam on 01-10-17.      CT angiogram from March 2015 showed a widely patent aorta to SMA bypass grafting, chronic occlusion of the celiac artery.   The patient was counseled re smoking cessation and given several free resources re smoking cessation.   Based on her exam and studies, and after discussing with Dr. Oneida Alar, I have offered the patient CTA abd/pelvis to evaluate mesenteric arteries and see Dr. Oneida Alar afterward in 2-4 weeks; bilateral carotid duplex in 1 year.   I advised pt to notify us if she develops food fear or post prandial abdominal pain.    I discussed in depth with the patient the nature of atherosclerosis, and emphasized the importance of maximal medical management including strict control of blood pressure, blood glucose, and lipid levels, obtaining regular exercise, and  cessation of smoking.    The patient is aware that without maximal medical management the underlying atherosclerotic disease process will progress, limiting the benefit of any interventions. The patient is currently on a statin. The patient is currently on an anti-platelet and anticoagulant medications.   Thank you for allowing [place] to participate in this patient's care.  Clemon Chambers, RN, MSN, FNP-C [facility] Office: [phone]  Clinic MD: Oneida Alar  07/25/2017, 9:40 AM

## 2017-08-01 DIAGNOSIS — M47816 Spondylosis without myelopathy or radiculopathy, lumbar region: Secondary | ICD-10-CM | POA: Diagnosis not present

## 2017-08-01 DIAGNOSIS — Z79899 Other long term (current) drug therapy: Secondary | ICD-10-CM | POA: Diagnosis not present

## 2017-08-01 DIAGNOSIS — N94819 Vulvodynia, unspecified: Secondary | ICD-10-CM | POA: Diagnosis not present

## 2017-08-01 DIAGNOSIS — Z5181 Encounter for therapeutic drug level monitoring: Secondary | ICD-10-CM | POA: Diagnosis not present

## 2017-08-01 DIAGNOSIS — M5442 Lumbago with sciatica, left side: Secondary | ICD-10-CM | POA: Diagnosis not present

## 2017-08-01 DIAGNOSIS — G8929 Other chronic pain: Secondary | ICD-10-CM | POA: Diagnosis not present

## 2017-08-01 DIAGNOSIS — M545 Low back pain: Secondary | ICD-10-CM | POA: Diagnosis not present

## 2017-08-29 ENCOUNTER — Ambulatory Visit
Admission: RE | Admit: 2017-08-29 | Discharge: 2017-08-29 | Disposition: A | Discharge status: Home / Self Care | Payer: Medicare Other | Source: Ambulatory Visit | Attending: Family | Admitting: Family

## 2017-08-29 ENCOUNTER — Encounter: Payer: Self-pay | Admitting: Vascular Surgery

## 2017-08-29 ENCOUNTER — Ambulatory Visit (INDEPENDENT_AMBULATORY_CARE_PROVIDER_SITE_OTHER): Payer: Medicare Other | Admitting: Vascular Surgery

## 2017-08-29 VITALS — BP 129/64 | HR 78 | Temp 97.8°F | Resp 16 | Ht 64.0 in | Wt 164.9 lb

## 2017-08-29 DIAGNOSIS — I771 Stricture of artery: Secondary | ICD-10-CM | POA: Diagnosis not present

## 2017-08-29 DIAGNOSIS — I708 Atherosclerosis of other arteries: Secondary | ICD-10-CM

## 2017-08-29 DIAGNOSIS — K551 Chronic vascular disorders of intestine: Secondary | ICD-10-CM

## 2017-08-29 DIAGNOSIS — N281 Cyst of kidney, acquired: Secondary | ICD-10-CM | POA: Diagnosis not present

## 2017-08-29 MED ORDER — IOPAMIDOL (ISOVUE-370) INJECTION 76%
75.0000 mL | Freq: Once | INTRAVENOUS | Status: AC | PRN
  Administered 2017-08-29: 75 mL via INTRAVENOUS

## 2017-08-29 NOTE — Progress Notes (Signed)
Patient is a 57 year old female who returns for follow-up today.  She previously underwent aorto to celiac and superior mesenteric artery bypass in 2004.  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  Recent duplex ultrasound showed increased velocity in the proximal graft to 302 cm/s.  She returns today after a follow-up CT angiogram.  Unfortunately she continues to smoke.  She was counseled against this today.  She denies any postprandial abdominal pain.  She does get occasional abdominal pain.  She is also noticed increased flatulence over the last few years.  She occasionally loses some weight intentionally but has not had any problems with eating.  Review of systems: She denies shortness of breath.  She denies chest pain.  Physical exam:  Vitals:   08/29/17 1502  BP: 129/64  Pulse: 78  Resp: 16  Temp: 97.8 F (36.6 C)  TempSrc: Oral  SpO2: 92%  Weight: 164 lb 14.4 oz (74.8 kg)  Height: 5\' 4"  (1.626 m)    Abdomen: Soft nontender nondistended no evidence of hernia 2+ femoral pulses bilaterally  Data: I reviewed the patient's CT angiogram of the abdomen and pelvis today.  This shows filling of the celiac axis distally the celiac limb of the bypass graft is chronically occluded.  The SMA portion of the bypass graft is widely patent with good filling of the distal SMA and no obvious narrowing.  Assessment: Patent aorto superior mesenteric artery bypass graft.  Plan: Follow-up 1 year for repeat mesenteric duplex exam.  She will see our nurse practitioner at that office visit.  Ruta Hinds, MD [facility] Office: [phone] Pager: [phone]

## 2017-09-05 DIAGNOSIS — C549 Malignant neoplasm of corpus uteri, unspecified: Secondary | ICD-10-CM | POA: Diagnosis not present

## 2017-09-05 DIAGNOSIS — Z8542 Personal history of malignant neoplasm of other parts of uterus: Secondary | ICD-10-CM | POA: Diagnosis not present

## 2017-09-05 DIAGNOSIS — Z122 Encounter for screening for malignant neoplasm of respiratory organs: Secondary | ICD-10-CM | POA: Diagnosis not present

## 2017-09-05 DIAGNOSIS — D6859 Other primary thrombophilia: Secondary | ICD-10-CM | POA: Diagnosis not present

## 2017-09-05 DIAGNOSIS — F3341 Major depressive disorder, recurrent, in partial remission: Secondary | ICD-10-CM | POA: Diagnosis not present

## 2017-09-05 DIAGNOSIS — F325 Major depressive disorder, single episode, in full remission: Secondary | ICD-10-CM | POA: Diagnosis not present

## 2017-09-05 DIAGNOSIS — Z5181 Encounter for therapeutic drug level monitoring: Secondary | ICD-10-CM | POA: Diagnosis not present

## 2017-09-05 DIAGNOSIS — Z7901 Long term (current) use of anticoagulants: Secondary | ICD-10-CM | POA: Diagnosis not present

## 2017-09-05 DIAGNOSIS — F17200 Nicotine dependence, unspecified, uncomplicated: Secondary | ICD-10-CM | POA: Diagnosis not present

## 2017-09-05 DIAGNOSIS — D7582 Heparin induced thrombocytopenia (HIT): Secondary | ICD-10-CM | POA: Diagnosis not present

## 2017-09-05 DIAGNOSIS — F17219 Nicotine dependence, cigarettes, with unspecified nicotine-induced disorders: Secondary | ICD-10-CM | POA: Diagnosis not present

## 2017-09-26 DIAGNOSIS — M47816 Spondylosis without myelopathy or radiculopathy, lumbar region: Secondary | ICD-10-CM | POA: Diagnosis not present

## 2017-09-26 DIAGNOSIS — M5442 Lumbago with sciatica, left side: Secondary | ICD-10-CM | POA: Diagnosis not present

## 2017-09-26 DIAGNOSIS — N94819 Vulvodynia, unspecified: Secondary | ICD-10-CM | POA: Diagnosis not present

## 2017-09-26 DIAGNOSIS — G8929 Other chronic pain: Secondary | ICD-10-CM | POA: Diagnosis not present

## 2017-09-26 DIAGNOSIS — M545 Low back pain: Secondary | ICD-10-CM | POA: Diagnosis not present

## 2017-10-03 DIAGNOSIS — G894 Chronic pain syndrome: Secondary | ICD-10-CM | POA: Diagnosis not present

## 2017-10-03 DIAGNOSIS — Z7901 Long term (current) use of anticoagulants: Secondary | ICD-10-CM | POA: Diagnosis not present

## 2017-10-31 DIAGNOSIS — Z7901 Long term (current) use of anticoagulants: Secondary | ICD-10-CM | POA: Diagnosis not present

## 2017-10-31 DIAGNOSIS — D689 Coagulation defect, unspecified: Secondary | ICD-10-CM | POA: Diagnosis not present

## 2017-11-08 DIAGNOSIS — M533 Sacrococcygeal disorders, not elsewhere classified: Secondary | ICD-10-CM | POA: Diagnosis not present

## 2017-11-21 DIAGNOSIS — M5442 Lumbago with sciatica, left side: Secondary | ICD-10-CM | POA: Diagnosis not present

## 2017-11-21 DIAGNOSIS — C549 Malignant neoplasm of corpus uteri, unspecified: Secondary | ICD-10-CM | POA: Diagnosis not present

## 2017-11-21 DIAGNOSIS — M533 Sacrococcygeal disorders, not elsewhere classified: Secondary | ICD-10-CM | POA: Diagnosis not present

## 2017-11-21 DIAGNOSIS — M5440 Lumbago with sciatica, unspecified side: Secondary | ICD-10-CM | POA: Diagnosis not present

## 2017-11-28 DIAGNOSIS — Z122 Encounter for screening for malignant neoplasm of respiratory organs: Secondary | ICD-10-CM | POA: Diagnosis not present

## 2017-11-28 DIAGNOSIS — F17219 Nicotine dependence, cigarettes, with unspecified nicotine-induced disorders: Secondary | ICD-10-CM | POA: Diagnosis not present

## 2017-11-28 DIAGNOSIS — Z7901 Long term (current) use of anticoagulants: Secondary | ICD-10-CM | POA: Diagnosis not present

## 2017-11-28 DIAGNOSIS — D689 Coagulation defect, unspecified: Secondary | ICD-10-CM | POA: Diagnosis not present

## 2017-12-05 DIAGNOSIS — D689 Coagulation defect, unspecified: Secondary | ICD-10-CM | POA: Diagnosis not present

## 2017-12-05 DIAGNOSIS — Z122 Encounter for screening for malignant neoplasm of respiratory organs: Secondary | ICD-10-CM | POA: Diagnosis not present

## 2017-12-05 DIAGNOSIS — Z7901 Long term (current) use of anticoagulants: Secondary | ICD-10-CM | POA: Diagnosis not present

## 2017-12-05 DIAGNOSIS — F17219 Nicotine dependence, cigarettes, with unspecified nicotine-induced disorders: Secondary | ICD-10-CM | POA: Diagnosis not present

## 2017-12-16 DIAGNOSIS — Z7901 Long term (current) use of anticoagulants: Secondary | ICD-10-CM | POA: Diagnosis not present

## 2017-12-16 DIAGNOSIS — D689 Coagulation defect, unspecified: Secondary | ICD-10-CM | POA: Diagnosis not present

## 2017-12-16 DIAGNOSIS — F17219 Nicotine dependence, cigarettes, with unspecified nicotine-induced disorders: Secondary | ICD-10-CM | POA: Diagnosis not present

## 2017-12-16 DIAGNOSIS — Z122 Encounter for screening for malignant neoplasm of respiratory organs: Secondary | ICD-10-CM | POA: Diagnosis not present

## 2017-12-20 DIAGNOSIS — Z7901 Long term (current) use of anticoagulants: Secondary | ICD-10-CM | POA: Diagnosis not present

## 2017-12-27 DIAGNOSIS — Z7901 Long term (current) use of anticoagulants: Secondary | ICD-10-CM | POA: Diagnosis not present

## 2018-01-03 DIAGNOSIS — Z7901 Long term (current) use of anticoagulants: Secondary | ICD-10-CM | POA: Diagnosis not present

## 2018-01-07 ENCOUNTER — Telehealth: Payer: Self-pay | Admitting: *Deleted

## 2018-01-07 MED ORDER — ATORVASTATIN CALCIUM 40 MG PO TABS: 40.0000 mg | ORAL_TABLET | Freq: Every day | ORAL | 11 refills | Status: DC

## 2018-01-07 NOTE — Telephone Encounter (Signed)
Spoke with patient who is aware of interaction of medication.  She will change to atorvastatin 40 mg .  She has been having c/o weakness/fatigue and I moved her appt up from September to June.  She will c/b with any questions or concerns before then.

## 2018-01-07 NOTE — Telephone Encounter (Signed)
Received fax from [facility]: pt taking simvastatin 40 mg daily and Diltiazem ER 120 mg daily together.  Reviewed with Dr Marlou Porch who gives orders to change from simvastatin to atorvastatin 40 mg po daily.  Called and left message for pt to c/b to discuss and also schedule her for her 1 yr f/u appt. With Dr Marlou Porch.

## 2018-01-07 NOTE — Telephone Encounter (Signed)
Follow Up:      Returning your call from today. 

## 2018-01-10 DIAGNOSIS — Z7901 Long term (current) use of anticoagulants: Secondary | ICD-10-CM | POA: Diagnosis not present

## 2018-01-14 DIAGNOSIS — Z7901 Long term (current) use of anticoagulants: Secondary | ICD-10-CM | POA: Diagnosis not present

## 2018-01-16 DIAGNOSIS — N94819 Vulvodynia, unspecified: Secondary | ICD-10-CM | POA: Diagnosis not present

## 2018-01-16 DIAGNOSIS — M533 Sacrococcygeal disorders, not elsewhere classified: Secondary | ICD-10-CM | POA: Diagnosis not present

## 2018-01-16 DIAGNOSIS — M5442 Lumbago with sciatica, left side: Secondary | ICD-10-CM | POA: Diagnosis not present

## 2018-01-16 DIAGNOSIS — C549 Malignant neoplasm of corpus uteri, unspecified: Secondary | ICD-10-CM | POA: Diagnosis not present

## 2018-01-20 DIAGNOSIS — Z7901 Long term (current) use of anticoagulants: Secondary | ICD-10-CM | POA: Diagnosis not present

## 2018-01-24 DIAGNOSIS — Z7901 Long term (current) use of anticoagulants: Secondary | ICD-10-CM | POA: Diagnosis not present

## 2018-01-31 ENCOUNTER — Encounter: Payer: Self-pay | Admitting: Cardiology

## 2018-02-07 DIAGNOSIS — Z7901 Long term (current) use of anticoagulants: Secondary | ICD-10-CM | POA: Diagnosis not present

## 2018-02-13 ENCOUNTER — Telehealth: Payer: Self-pay | Admitting: *Deleted

## 2018-02-13 ENCOUNTER — Other Ambulatory Visit (HOSPITAL_COMMUNITY)
Admission: RE | Admit: 2018-02-13 | Discharge: 2018-02-13 | Disposition: A | Discharge status: Home / Self Care | Payer: Medicare Other | Source: Ambulatory Visit | Attending: Obstetrics and Gynecology | Admitting: Obstetrics and Gynecology

## 2018-02-13 ENCOUNTER — Other Ambulatory Visit: Payer: Self-pay | Admitting: Obstetrics and Gynecology

## 2018-02-13 ENCOUNTER — Ambulatory Visit (INDEPENDENT_AMBULATORY_CARE_PROVIDER_SITE_OTHER): Payer: Medicare Other | Admitting: Cardiology

## 2018-02-13 ENCOUNTER — Encounter: Payer: Self-pay | Admitting: Cardiology

## 2018-02-13 VITALS — BP 130/68 | HR 83 | Ht 64.0 in | Wt 172.0 lb

## 2018-02-13 DIAGNOSIS — Z01419 Encounter for gynecological examination (general) (routine) without abnormal findings: Secondary | ICD-10-CM | POA: Diagnosis not present

## 2018-02-13 DIAGNOSIS — F17200 Nicotine dependence, unspecified, uncomplicated: Secondary | ICD-10-CM

## 2018-02-13 DIAGNOSIS — R0683 Snoring: Secondary | ICD-10-CM | POA: Diagnosis not present

## 2018-02-13 DIAGNOSIS — I771 Stricture of artery: Secondary | ICD-10-CM | POA: Diagnosis not present

## 2018-02-13 DIAGNOSIS — K551 Chronic vascular disorders of intestine: Secondary | ICD-10-CM

## 2018-02-13 DIAGNOSIS — I708 Atherosclerosis of other arteries: Secondary | ICD-10-CM

## 2018-02-13 DIAGNOSIS — I2510 Atherosclerotic heart disease of native coronary artery without angina pectoris: Secondary | ICD-10-CM

## 2018-02-13 DIAGNOSIS — I209 Angina pectoris, unspecified: Secondary | ICD-10-CM

## 2018-02-13 DIAGNOSIS — Z8542 Personal history of malignant neoplasm of other parts of uterus: Secondary | ICD-10-CM | POA: Diagnosis not present

## 2018-02-13 DIAGNOSIS — R400 Somnolence: Secondary | ICD-10-CM

## 2018-02-13 DIAGNOSIS — I739 Peripheral vascular disease, unspecified: Secondary | ICD-10-CM

## 2018-02-13 DIAGNOSIS — Z9189 Other specified personal risk factors, not elsewhere classified: Secondary | ICD-10-CM | POA: Diagnosis not present

## 2018-02-13 DIAGNOSIS — N941 Unspecified dyspareunia: Secondary | ICD-10-CM | POA: Diagnosis not present

## 2018-02-13 DIAGNOSIS — D6859 Other primary thrombophilia: Secondary | ICD-10-CM

## 2018-02-13 NOTE — Progress Notes (Signed)
Cardiology Office Note:    Date:  02/13/2018   ID:  Danielle Barrett, DOB 1961/06/25, MRN [id number]  PCP:  [name], MD  Cardiologist:  Candee Furbish, MD   Referring MD: [name], MD     History of Present Illness:    Danielle Barrett is a 57 y.o. female here for follow up of chest pain at the request of Dr. [name].  She had a cardiac catheterization with diagonal cutting balloon angioplasty in June 2011. No stent placed. Normal ejection fraction. 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 Oneida Alar. Patent SMA graft on duplex.  She is a history of heparin-induced thrombocytopenia and anxiety disorder.  Had been feeling some chest discomfort back in 11/1016 which resolved with nitroglycerin. Radiating to neck. Woke up with this. NTG would help. Fall back to sleep.  Been extremely tired. Dog wakes her up scratching at night. 2 naps a day.   She has been on chronic warfarin therapy because of the previous mesenteric ischemia and celiac bypass. She also has been diagnosed with antithrombin III deficiency. She was last seen by vascular surgery on 01/10/17.  She continues to smoke. No prior history of stroke. Overall she seems to be feeling better, no significant chest discomfort. She did have some headaches on higher dose of isosorbide and this has been adjusted. No significant shortness of breath. Still struggling with smoking. She saw vascular as below.  02/13/2018- review of systems sheet positive for excessive fatigue sweating leg pain cough shortness of breath abdominal pain depression back pain dizziness bruising wheezing anxiety headaches. Sister has OSA. +Snores.  Quite a bit of daytime somnolence.  Sometimes however she does have to wake up to take the dog out.  Ends up sleeping on the couch.  She has not had any further significant chest discomfort or angina.   She has been short of breath with activity.  Continues to smoke.  Sometimes she will have sensation of her heart racing when laying down at night.  Usually transient.  Discussed taking her diltiazem at dinnertime versus immediately before bedtime.  Past Medical History:  Diagnosis Date  . Antithrombin 3 deficiency ([place])   . Asthma   . Cancer ([place])   . Coronary artery disease   . Grave's disease   . Grave's disease   . Hyperlipidemia   . Hypertension   . Peripheral vascular disease [facility])     Past Surgical History:  Procedure Laterality Date  . ABDOMINAL HYSTERECTOMY    . ABDOMINAL SURGERY    . aorto to celiac and superior mesenteric artery bypass  11/22/2003  . COLON SURGERY    . coronary artery angio  02/2010  . thrombectomy of SMA  01/2007    Current Medications: Current Meds  Medication Sig  . albuterol (PROVENTIL HFA;VENTOLIN HFA) 108 (90 BASE) MCG/ACT inhaler Inhale 2 puffs into the lungs every 6 (six) hours as needed. Shortness of breath  . ALPRAZolam (XANAX) 0.5 MG tablet Take 0.5 mg by mouth 3 (three) times daily as needed for anxiety or sleep.   Marland Kitchen aspirin EC 81 MG tablet Take 1 tablet (81 mg total) by mouth daily.  Marland Kitchen atorvastatin (LIPITOR) 40 MG tablet Take 1 tablet (40 mg total) by mouth daily.  . bisacodyl (DULCOLAX) 5 MG EC tablet Take 5 mg by mouth daily.  . cetirizine (ZYRTEC) 10 MG tablet Take 10  mg by mouth daily.  . Cholecalciferol (VITAMIN D) 2000 units tablet Take 1 tablet by mouth daily.  Marland Kitchen diltiazem (CARDIZEM CD) 120 MG 24 hr capsule TAKE ONE CAPSULE BY MOUTH EVERY DAY  . isosorbide mononitrate (IMDUR) 30 MG 24 hr tablet Take 15 mg by mouth daily.  Marland Kitchen levothyroxine (SYNTHROID, LEVOTHROID) 75 MCG tablet Take 1 tablet by mouth daily.  Marland Kitchen lisinopril-hydrochlorothiazide (PRINZIDE,ZESTORETIC) 20-25 MG per tablet Take 1 tablet by mouth every morning.  . methocarbamol (ROBAXIN) 750 MG tablet Take 750 mg by mouth 3 (three) times daily as needed for muscle spasms.     Marland Kitchen morphine (MS CONTIN) 15 MG 12 hr tablet Take 15 mg by mouth 3 (three) times daily.  . naloxone (NARCAN) nasal spray 4 mg/0.1 mL Place 1 spray into the nose as needed.  . nitroGLYCERIN (NITROQUICK) 0.4 MG SL tablet Place 0.4 mg under the tongue every 5 (five) minutes as needed. Chest pain  . Propylene Glycol (SYSTANE BALANCE OP) Apply 1 drop to eye as needed. For dry eyes  . venlafaxine (EFFEXOR) 37.5 MG tablet Take 37.5 mg by mouth 2 (two) times daily. Reported on 11/01/2015  . warfarin (COUMADIN) 7.5 MG tablet Take 7 mg by mouth daily. Monday Tuesday,wednesday, Friday is 5 mg. 7.5 mg on Thursday, Saturday, Sunday. Last INR check was on 10-23-13     Allergies:   Amitriptyline; Topiramate; Eggs or egg-derived products; Heparin; Penicillins; and Poultry meal   Social History   Socioeconomic History  . Marital status: Married    Spouse name: Not on file  . Number of children: Not on file  . Years of education: Not on file  . Highest education level: Not on file  Occupational History  . Not on file  Social Needs  . Financial resource strain: Not on file  . Food insecurity:    Worry: Not on file    Inability: Not on file  . Transportation needs:    Medical: Not on file    Non-medical: Not on file  Tobacco Use  . Smoking status: Current Every Day Smoker    Packs/day: 1.00    Types: Cigarettes  . Smokeless tobacco: Never Used  Substance and Sexual Activity  . Alcohol use: Yes    Alcohol/week: 0.0 oz    Comment: occasional  . Drug use: No  . Sexual activity: Yes    Birth control/protection: Surgical  Lifestyle  . Physical activity:    Days per week: Not on file    Minutes per session: Not on file  . Stress: Not on file  Relationships  . Social connections:    Talks on phone: Not on file    Gets together: Not on file    Attends religious service: Not on file    Active member of club or organization: Not on file    Attends meetings of clubs or organizations: Not on file     Relationship status: Not on file  Other Topics Concern  . Not on file  Social History Narrative  . Not on file     family history includes Diabetes in her father; Heart attack in her mother; Heart disease in her father and mother; Hypertension in her father and mother; Stroke in her father.   ROS:   Please see the history of present illness.    All other ROS negative   EKGs/Labs/Other Studies Reviewed:    EKG:  EKG is  ordered today.  The ekg ordered today demonstrates, 02/13/2018-sinus rhythm 83  nonspecific ST-T wave flattening-12/14/16-sinus rhythm nonspecific ST-T wave changes heart rate 91 bpm otherwise unremarkable. Personally viewed.  Recent Labs: No results found for requested labs within last 8760 hours.   LDL is 67, hemoglobin A1c 5.6  Recent Lipid Panel    Component Value Date/Time   CHOL (H) 12/22/2009 0635    238        ATP III CLASSIFICATION:  <200     mg/dL   Desirable  200-239  mg/dL   Borderline High  >=240    mg/dL   High          TRIG 382 (H) 12/22/2009 0635   HDL 30 (L) 12/22/2009 0635   CHOLHDL 7.9 12/22/2009 0635   VLDL 76 (H) 12/22/2009 0635   LDLCALC (H) 12/22/2009 0635    132        Total Cholesterol/HDL:CHD Risk Coronary Heart Disease Risk Table                     Men   Women  1/2 Average Risk   3.4   3.3  Average Risk       5.0   4.4  2 X Average Risk   9.6   7.1  3 X Average Risk  23.4   11.0        Use the calculated Patient Ratio above and the CHD Risk Table to determine the patient's CHD Risk.        ATP III CLASSIFICATION (LDL):  <100     mg/dL   Optimal  100-129  mg/dL   Near or Above                    Optimal  130-159  mg/dL   Borderline  160-189  mg/dL   High  >190     mg/dL   Very High    Physical Exam:    VS:  BP 130/68   Pulse 83   Ht 5\' 4"  (1.626 m)   Wt 172 lb (78 kg)   LMP  (LMP Unknown)   SpO2 96%   BMI 29.52 kg/m     Wt Readings from Last 3 Encounters:  02/13/18 172 lb (78 kg)  08/29/17 164 lb 14.4 oz  (74.8 kg)  07/25/17 165 lb 12.8 oz (75.2 kg)     GEN: Well nourished, well developed, in no acute distress  HEENT: normal  Neck: no JVD, carotid bruits, or masses Cardiac: RRR; no murmurs, rubs, or gallops,no edema  Respiratory:  clear to auscultation bilaterally, normal work of breathing GI: soft, nontender, nondistended, + BS MS: no deformity or atrophy  Skin: warm and dry, no rash Neuro:  Alert and Oriented x 3, Strength and sensation are intact Psych: euthymic mood, full affect    Echocardiogram on 12/22/2009:  - Left ventricle: The cavity size was normal. Systolic function was  normal. The estimated ejection fraction was in the range of 55% to  60%. Wall motion was normal; there were no regional wall motion  abnormalities. Left ventricular diastolic function parameters were  normal. - Mitral valve: Mild regurgitation.  Cardiac catheterization 01/2011-80 percent stenosis of marginal branch and mild LAD stenosis.  ASSESSMENT:    1. Daytime somnolence   2. Snoring   3. Coronary artery disease involving native coronary artery of native heart without angina pectoris   4. Current smoker   5. Angina pectoris ([place])   6. SMA stenosis ([place])   7. PVD (peripheral vascular disease) (  [place])   8. Antithrombin 3 deficiency ([facility])    PLAN:    In order of problems listed above:  Chest pain/CAD/angina  - Prior cutting balloon angioplasty of obtuse marginal branch back in 2012.  - Occasional discomfort relieved with nitroglycerin.  - diltiazem.Had stopped the metoprolol previously in the past because of fatigue.  - Hopefully calcium channel blocker will help as an antianginal. Continue with Imdur 15 mg (she is taking half of the 30 mg, could not tolerate higher dose because of headaches). Ranexa could be an option for her in the future.  - She is not experiencing this discomfort with any exertional activity. It only seems to happen periodically at night.  - Overall seems to be  doing quite well with this medication regimen without any significant angina.  Main complaint seems to be shortness of breath with activity.  For instance when she was in jury duty she had to walk from a far parking lot to the court house got turned around and she was quite winded.  Continue to encourage exercise, tobacco cessation.  PVD  - Prior mesenteric ischemia at bypass, Dr. Oneida Alar. They are watching the increased velocity at the proximal portion of the graft.  Good distal pulses noted on exam.  Continue with aggressive secondary prevention.  Chronic anticoagulation  - On warfarin and aspirin. Antithrombin III deficiency.  Read about possibility of switching to novel oral anticoagulant however it seems to still be the recommendation to continue with warfarin at this point although theoretically the new medication should be reasonable.  - Checked in [facility] on cessation today. Both she and her husband would like to quit together.  Still continues to be 1 of her major overarching risk factors.  I am quite concerned about her ongoing tobacco use especially in the light of her hypercoagulable state.  She is at risk for worsening thrombosis.  She understands and takes responsibility.  Daytime somnolence/snoring - Checking sleep study.   Medication Adjustments/Labs and Tests Ordered: Current medicines are reviewed at length with the patient today.  Concerns regarding medicines are outlined above. Labs and tests ordered and medication changes are outlined in the patient instructions below:  Patient Instructions  Medication Instructions:  The current medical regimen is effective;  continue present plan and medications.  Testing/Procedures: Your physician has recommended that you have a sleep study. This test records several body functions during sleep, including: brain activity, eye movement, oxygen and carbon dioxide blood levels, heart rate and rhythm,  breathing rate and rhythm, the flow of air through your mouth and nose, snoring, body muscle movements, and chest and belly movement.  Follow-Up: Follow up in 1 year with Dr. Marlou Porch.  You will receive a letter in the mail 2 months before you are due.  Please call us when you receive this letter to schedule your follow up appointment.  If you need a refill on your cardiac medications before your next appointment, please call your pharmacy.  Thank you for choosing [facility]!!        Signed, Candee Furbish, MD  02/13/2018 8:59 AM    [facility]

## 2018-02-13 NOTE — Telephone Encounter (Signed)
-----   Message from Shellia Cleverly, RN sent at 02/13/2018  8:55 AM EDT ----- Regarding: sleep study Pt has been ordered to have split night sleep study for snoring and daytime somnolence  Thank you

## 2018-02-13 NOTE — Patient Instructions (Signed)
Medication Instructions:  The current medical regimen is effective;  continue present plan and medications.  Testing/Procedures: Your physician has recommended that you have a sleep study. This test records several body functions during sleep, including: brain activity, eye movement, oxygen and carbon dioxide blood levels, heart rate and rhythm, breathing rate and rhythm, the flow of air through your mouth and nose, snoring, body muscle movements, and chest and belly movement.  Follow-Up: Follow up in 1 year with Dr. Marlou Porch.  You will receive a letter in the mail 2 months before you are due.  Please call us when you receive this letter to schedule your follow up appointment.  If you need a refill on your cardiac medications before your next appointment, please call your pharmacy.  Thank you for choosing [facility]!!

## 2018-02-14 ENCOUNTER — Telehealth: Payer: Self-pay | Admitting: *Deleted

## 2018-02-14 LAB — CYTOLOGY - PAP: DIAGNOSIS: NEGATIVE

## 2018-02-14 NOTE — Telephone Encounter (Signed)
-----   Message from Freada Bergeron, [place] sent at 02/13/2018 12:55 PM EDT ----- Regarding: pre cert   ----- Message ----- From: Shellia Cleverly, RN Sent: 02/13/2018   8:55 AM To: Freada Bergeron, CMA, # Subject: sleep study                                    Pt has been ordered to have split night sleep study for snoring and daytime somnolence  Thank you

## 2018-02-14 NOTE — Telephone Encounter (Signed)
Staff message sent to Danielle Barrett patient has Medicare and does not need a precert. Ok to schedule sleep study.

## 2018-02-17 ENCOUNTER — Telehealth: Payer: Self-pay | Admitting: *Deleted

## 2018-02-17 NOTE — Telephone Encounter (Signed)
Patient is scheduled for lab study on 03/12/18. Patient understands her sleep study will be done at [facility] sleep lab. Patient understands she will receive a sleep packet in a week or so. Patient understands to call if she does not receive the sleep packet in a timely manner. Patient agrees with treatment and thanked me for call.

## 2018-02-17 NOTE — Telephone Encounter (Signed)
-----   Message from Lauralee Evener, [place] sent at 02/14/2018 11:15 AM EDT ----- Regarding: RE: pre cert Patient has Medicare and does not require a precert. Ok to schedule. ----- Message ----- From: Freada Bergeron, CMA Sent: 02/13/2018  12:55 PM To: [facility] Subject: pre cert                                         ----- Message ----- From: Shellia Cleverly, RN Sent: 02/13/2018   8:55 AM To: Freada Bergeron, CMA, # Subject: sleep study                                    Pt has been ordered to have split night sleep study for snoring and daytime somnolence  Thank you

## 2018-02-28 ENCOUNTER — Other Ambulatory Visit: Payer: Self-pay

## 2018-02-28 MED ORDER — ATORVASTATIN CALCIUM 40 MG PO TABS: 40.0000 mg | ORAL_TABLET | Freq: Every day | ORAL | 1 refills | Status: DC

## 2018-03-04 DIAGNOSIS — M533 Sacrococcygeal disorders, not elsewhere classified: Secondary | ICD-10-CM | POA: Diagnosis not present

## 2018-03-07 DIAGNOSIS — Z7901 Long term (current) use of anticoagulants: Secondary | ICD-10-CM | POA: Diagnosis not present

## 2018-03-12 ENCOUNTER — Ambulatory Visit (HOSPITAL_BASED_OUTPATIENT_CLINIC_OR_DEPARTMENT_OTHER): Payer: Medicare Other | Attending: Cardiology | Admitting: Cardiology

## 2018-03-12 VITALS — Ht 64.0 in | Wt 170.0 lb

## 2018-03-12 DIAGNOSIS — Z79899 Other long term (current) drug therapy: Secondary | ICD-10-CM | POA: Diagnosis not present

## 2018-03-12 DIAGNOSIS — R0683 Snoring: Secondary | ICD-10-CM | POA: Diagnosis not present

## 2018-03-12 DIAGNOSIS — R400 Somnolence: Secondary | ICD-10-CM | POA: Insufficient documentation

## 2018-03-12 DIAGNOSIS — I10 Essential (primary) hypertension: Secondary | ICD-10-CM | POA: Diagnosis not present

## 2018-03-13 ENCOUNTER — Telehealth: Payer: Self-pay | Admitting: *Deleted

## 2018-03-13 NOTE — Telephone Encounter (Signed)
-----   Message from Sueanne Margarita, MD sent at 03/13/2018  5:00 PM EDT ----- Please let patient know that sleep study showed no significant sleep apnea.

## 2018-03-13 NOTE — Telephone Encounter (Signed)
Informed patient of sleep study results and patient understanding was verbalized. Patient understands her sleep study showed no significant sleep apnea.   Pt is aware and agreeable to normal results.  

## 2018-03-13 NOTE — Procedures (Signed)
   Patient Name: Danielle Barrett, Danielle Barrett  Study Date:08/06/2017 03/12/2018   Gender: Female  D.O.B: 19-Jul-1961  Age (years): 56  Referring Provider: [name]  Height (inches): 64  Interpreting Physician: Fransico Him MD, ABSM  Weight (lbs): 170  RPSGT: Carolin Coy  BMI: 29  MRN: [id number]  Neck Size: 14.00   CLINICAL INFORMATION  Sleep Study Type: NPSG Indication for sleep study: Excessive Daytime Sleepiness, Fatigue, Hypertension, Snoring Epworth Sleepiness Score: 8  SLEEP STUDY TECHNIQUE  As per the AASM Manual for the Scoring of Sleep and Associated Events v2.3 (April 2016) with a hypopnea requiring 4% desaturations. The channels recorded and monitored were frontal, central and occipital EEG, electrooculogram (EOG), submentalis EMG (chin), nasal and oral airflow, thoracic and abdominal wall motion, anterior tibialis EMG, snore microphone, electrocardiogram, and pulse oximetry.  MEDICATIONS  Medications self-administered by patient taken the night of the study : VITAMIN D3, MORPHINE SULFER, DILTIAZEM HCL, ATORVASTATIN, ISOOSORBIDE MONONITER, VENLAFAXINE HCL, STOOL SOFTENER, ZYRTEC  SLEEP ARCHITECTURE  The study was initiated at 10:27:54 PM and ended at 4:47:06 AM. Sleep onset time was 16.2 minutes and the sleep efficiency was 90.2%%. The total sleep time was 342 minutes. Stage REM latency was 290.5 minutes. The patient spent 10.2%% of the night in stage N1 sleep, 70.5%% in stage N2 sleep, 0.0%% in stage N3 and 19.30% in REM. Alpha intrusion was absent. Supine sleep was 17.13%.  RESPIRATORY PARAMETERS  The overall apnea/hypopnea index (AHI) was 0.7 per hour. There were 1 total apneas, including 0 obstructive, 1 central and 0 mixed apneas. There were 3 hypopneas and 21 RERAs. The AHI during Stage REM sleep was 1.8 per hour. AHI while supine was 2.0 per hour. The mean oxygen saturation was 91.3%. The minimum SpO2 during sleep was 84.0%. moderate snoring was noted during this  study.  CARDIAC DATA  The 2 lead EKG demonstrated sinus rhythm. The mean heart rate was 66.0 beats per minute. Other EKG findings include: PACs.  LEG MOVEMENT DATA  The total PLMS were 0 with a resulting PLMS index of 0.0. Associated arousal with leg movement index was 0.0 .  IMPRESSIONS  - No significant obstructive sleep apnea occurred during this study (AHI = 0.7/h). - No significant central sleep apnea occurred during this study (CAI = 0.2/h).  - Mild oxygen desaturation was noted during this study (Min O2 = 84.0%).  - The patient snored with moderate snoring volume.  - PACs were noted during this study. - Clinically significant periodic limb movements did not occur during sleep. No significant associated arousals.  DIAGNOSIS  - Normal Study  RECOMMENDATIONS  - Avoid alcohol, sedatives and other CNS depressants that may worsen sleep apnea and disrupt normal sleep architecture.  - Sleep hygiene should be reviewed to assess factors that may improve sleep quality.  - Weight management and regular exercise should be initiated or continued if appropriate.  [Electronically signed] 03/13/2018 04:55 PM Fransico Him MD, ABSM  Diplomate, American Board of Sleep Medicine

## 2018-03-17 DIAGNOSIS — G894 Chronic pain syndrome: Secondary | ICD-10-CM | POA: Diagnosis not present

## 2018-03-17 DIAGNOSIS — Z79899 Other long term (current) drug therapy: Secondary | ICD-10-CM | POA: Diagnosis not present

## 2018-03-17 DIAGNOSIS — C549 Malignant neoplasm of corpus uteri, unspecified: Secondary | ICD-10-CM | POA: Diagnosis not present

## 2018-03-17 DIAGNOSIS — M533 Sacrococcygeal disorders, not elsewhere classified: Secondary | ICD-10-CM | POA: Diagnosis not present

## 2018-03-17 DIAGNOSIS — Z5181 Encounter for therapeutic drug level monitoring: Secondary | ICD-10-CM | POA: Diagnosis not present

## 2018-03-17 DIAGNOSIS — M5442 Lumbago with sciatica, left side: Secondary | ICD-10-CM | POA: Diagnosis not present

## 2018-04-04 DIAGNOSIS — Z7901 Long term (current) use of anticoagulants: Secondary | ICD-10-CM | POA: Diagnosis not present

## 2018-04-23 DIAGNOSIS — M2012 Hallux valgus (acquired), left foot: Secondary | ICD-10-CM | POA: Diagnosis not present

## 2018-04-23 DIAGNOSIS — G5791 Unspecified mononeuropathy of right lower limb: Secondary | ICD-10-CM | POA: Diagnosis not present

## 2018-04-23 DIAGNOSIS — G5761 Lesion of plantar nerve, right lower limb: Secondary | ICD-10-CM | POA: Diagnosis not present

## 2018-04-23 DIAGNOSIS — M2011 Hallux valgus (acquired), right foot: Secondary | ICD-10-CM | POA: Diagnosis not present

## 2018-05-01 ENCOUNTER — Ambulatory Visit: Payer: Medicare Other | Admitting: Cardiology

## 2018-05-02 DIAGNOSIS — Z7901 Long term (current) use of anticoagulants: Secondary | ICD-10-CM | POA: Diagnosis not present

## 2018-05-12 DIAGNOSIS — M7061 Trochanteric bursitis, right hip: Secondary | ICD-10-CM | POA: Diagnosis not present

## 2018-05-12 DIAGNOSIS — C549 Malignant neoplasm of corpus uteri, unspecified: Secondary | ICD-10-CM | POA: Diagnosis not present

## 2018-05-12 DIAGNOSIS — G894 Chronic pain syndrome: Secondary | ICD-10-CM | POA: Diagnosis not present

## 2018-05-12 DIAGNOSIS — M533 Sacrococcygeal disorders, not elsewhere classified: Secondary | ICD-10-CM | POA: Diagnosis not present

## 2018-05-30 DIAGNOSIS — Z7901 Long term (current) use of anticoagulants: Secondary | ICD-10-CM | POA: Diagnosis not present

## 2018-06-06 DIAGNOSIS — Z7901 Long term (current) use of anticoagulants: Secondary | ICD-10-CM | POA: Diagnosis not present

## 2018-06-17 ENCOUNTER — Other Ambulatory Visit: Payer: Self-pay | Admitting: Internal Medicine

## 2018-06-17 DIAGNOSIS — K559 Vascular disorder of intestine, unspecified: Secondary | ICD-10-CM | POA: Diagnosis not present

## 2018-06-17 DIAGNOSIS — F3341 Major depressive disorder, recurrent, in partial remission: Secondary | ICD-10-CM | POA: Diagnosis not present

## 2018-06-17 DIAGNOSIS — G8929 Other chronic pain: Secondary | ICD-10-CM | POA: Diagnosis not present

## 2018-06-17 DIAGNOSIS — I739 Peripheral vascular disease, unspecified: Secondary | ICD-10-CM | POA: Diagnosis not present

## 2018-06-17 DIAGNOSIS — E7800 Pure hypercholesterolemia, unspecified: Secondary | ICD-10-CM | POA: Diagnosis not present

## 2018-06-17 DIAGNOSIS — Z1231 Encounter for screening mammogram for malignant neoplasm of breast: Secondary | ICD-10-CM

## 2018-06-17 DIAGNOSIS — C55 Malignant neoplasm of uterus, part unspecified: Secondary | ICD-10-CM | POA: Diagnosis not present

## 2018-06-17 DIAGNOSIS — I771 Stricture of artery: Secondary | ICD-10-CM | POA: Diagnosis not present

## 2018-06-17 DIAGNOSIS — F419 Anxiety disorder, unspecified: Secondary | ICD-10-CM | POA: Diagnosis not present

## 2018-06-17 DIAGNOSIS — R739 Hyperglycemia, unspecified: Secondary | ICD-10-CM | POA: Diagnosis not present

## 2018-06-17 DIAGNOSIS — I2510 Atherosclerotic heart disease of native coronary artery without angina pectoris: Secondary | ICD-10-CM | POA: Diagnosis not present

## 2018-06-17 DIAGNOSIS — D6859 Other primary thrombophilia: Secondary | ICD-10-CM | POA: Diagnosis not present

## 2018-06-20 DIAGNOSIS — Z7901 Long term (current) use of anticoagulants: Secondary | ICD-10-CM | POA: Diagnosis not present

## 2018-06-20 DIAGNOSIS — D689 Coagulation defect, unspecified: Secondary | ICD-10-CM | POA: Diagnosis not present

## 2018-07-07 DIAGNOSIS — C549 Malignant neoplasm of corpus uteri, unspecified: Secondary | ICD-10-CM | POA: Diagnosis not present

## 2018-07-07 DIAGNOSIS — M533 Sacrococcygeal disorders, not elsewhere classified: Secondary | ICD-10-CM | POA: Diagnosis not present

## 2018-07-07 DIAGNOSIS — G894 Chronic pain syndrome: Secondary | ICD-10-CM | POA: Diagnosis not present

## 2018-07-07 DIAGNOSIS — M5442 Lumbago with sciatica, left side: Secondary | ICD-10-CM | POA: Diagnosis not present

## 2018-07-09 ENCOUNTER — Other Ambulatory Visit: Payer: Self-pay | Admitting: Cardiology

## 2018-07-09 DIAGNOSIS — I209 Angina pectoris, unspecified: Secondary | ICD-10-CM

## 2018-07-18 DIAGNOSIS — D689 Coagulation defect, unspecified: Secondary | ICD-10-CM | POA: Diagnosis not present

## 2018-07-18 DIAGNOSIS — Z7901 Long term (current) use of anticoagulants: Secondary | ICD-10-CM | POA: Diagnosis not present

## 2018-07-25 ENCOUNTER — Ambulatory Visit: Payer: Medicare Other

## 2018-08-21 DIAGNOSIS — T45515A Adverse effect of anticoagulants, initial encounter: Secondary | ICD-10-CM | POA: Diagnosis not present

## 2018-08-21 DIAGNOSIS — F3341 Major depressive disorder, recurrent, in partial remission: Secondary | ICD-10-CM | POA: Diagnosis not present

## 2018-08-21 DIAGNOSIS — Z7901 Long term (current) use of anticoagulants: Secondary | ICD-10-CM | POA: Diagnosis not present

## 2018-08-21 DIAGNOSIS — D7582 Heparin induced thrombocytopenia (HIT): Secondary | ICD-10-CM | POA: Diagnosis not present

## 2018-08-21 DIAGNOSIS — F17219 Nicotine dependence, cigarettes, with unspecified nicotine-induced disorders: Secondary | ICD-10-CM | POA: Diagnosis not present

## 2018-08-21 DIAGNOSIS — Z8542 Personal history of malignant neoplasm of other parts of uterus: Secondary | ICD-10-CM | POA: Diagnosis not present

## 2018-08-21 DIAGNOSIS — D6859 Other primary thrombophilia: Secondary | ICD-10-CM | POA: Diagnosis not present

## 2018-08-21 DIAGNOSIS — Z122 Encounter for screening for malignant neoplasm of respiratory organs: Secondary | ICD-10-CM | POA: Diagnosis not present

## 2018-09-01 DIAGNOSIS — M5412 Radiculopathy, cervical region: Secondary | ICD-10-CM | POA: Diagnosis not present

## 2018-09-01 DIAGNOSIS — Z79899 Other long term (current) drug therapy: Secondary | ICD-10-CM | POA: Diagnosis not present

## 2018-09-01 DIAGNOSIS — N94819 Vulvodynia, unspecified: Secondary | ICD-10-CM | POA: Diagnosis not present

## 2018-09-01 DIAGNOSIS — M545 Low back pain: Secondary | ICD-10-CM | POA: Diagnosis not present

## 2018-09-01 DIAGNOSIS — M5442 Lumbago with sciatica, left side: Secondary | ICD-10-CM | POA: Diagnosis not present

## 2018-09-01 DIAGNOSIS — Z5181 Encounter for therapeutic drug level monitoring: Secondary | ICD-10-CM | POA: Diagnosis not present

## 2018-09-01 DIAGNOSIS — G8929 Other chronic pain: Secondary | ICD-10-CM | POA: Diagnosis not present

## 2018-09-02 ENCOUNTER — Other Ambulatory Visit (HOSPITAL_COMMUNITY): Payer: Self-pay | Admitting: Oncology

## 2018-09-02 ENCOUNTER — Other Ambulatory Visit (HOSPITAL_COMMUNITY): Payer: Self-pay | Admitting: Certified Registered"

## 2018-09-02 DIAGNOSIS — M5412 Radiculopathy, cervical region: Secondary | ICD-10-CM

## 2018-09-04 DIAGNOSIS — F3341 Major depressive disorder, recurrent, in partial remission: Secondary | ICD-10-CM | POA: Diagnosis not present

## 2018-09-04 DIAGNOSIS — D6859 Other primary thrombophilia: Secondary | ICD-10-CM | POA: Diagnosis not present

## 2018-09-04 DIAGNOSIS — Z7901 Long term (current) use of anticoagulants: Secondary | ICD-10-CM | POA: Diagnosis not present

## 2018-09-04 DIAGNOSIS — T45515A Adverse effect of anticoagulants, initial encounter: Secondary | ICD-10-CM | POA: Diagnosis not present

## 2018-09-04 DIAGNOSIS — D7582 Heparin induced thrombocytopenia (HIT): Secondary | ICD-10-CM | POA: Diagnosis not present

## 2018-09-04 DIAGNOSIS — Z8542 Personal history of malignant neoplasm of other parts of uterus: Secondary | ICD-10-CM | POA: Diagnosis not present

## 2018-09-06 ENCOUNTER — Ambulatory Visit (HOSPITAL_COMMUNITY)
Admission: RE | Admit: 2018-09-06 | Discharge: 2018-09-06 | Disposition: A | Discharge status: Home / Self Care | Payer: Medicare Other | Source: Ambulatory Visit | Attending: Oncology | Admitting: Oncology

## 2018-09-06 DIAGNOSIS — M5021 Other cervical disc displacement,  high cervical region: Secondary | ICD-10-CM | POA: Diagnosis not present

## 2018-09-06 DIAGNOSIS — M5412 Radiculopathy, cervical region: Secondary | ICD-10-CM

## 2018-09-11 DIAGNOSIS — Z7901 Long term (current) use of anticoagulants: Secondary | ICD-10-CM | POA: Diagnosis not present

## 2018-09-11 DIAGNOSIS — D7582 Heparin induced thrombocytopenia (HIT): Secondary | ICD-10-CM | POA: Diagnosis not present

## 2018-09-11 DIAGNOSIS — F3341 Major depressive disorder, recurrent, in partial remission: Secondary | ICD-10-CM | POA: Diagnosis not present

## 2018-09-11 DIAGNOSIS — F17219 Nicotine dependence, cigarettes, with unspecified nicotine-induced disorders: Secondary | ICD-10-CM | POA: Diagnosis not present

## 2018-09-11 DIAGNOSIS — T45515A Adverse effect of anticoagulants, initial encounter: Secondary | ICD-10-CM | POA: Diagnosis not present

## 2018-09-11 DIAGNOSIS — Z122 Encounter for screening for malignant neoplasm of respiratory organs: Secondary | ICD-10-CM | POA: Diagnosis not present

## 2018-09-11 DIAGNOSIS — Z8542 Personal history of malignant neoplasm of other parts of uterus: Secondary | ICD-10-CM | POA: Diagnosis not present

## 2018-09-11 DIAGNOSIS — C549 Malignant neoplasm of corpus uteri, unspecified: Secondary | ICD-10-CM | POA: Diagnosis not present

## 2018-09-11 DIAGNOSIS — D6859 Other primary thrombophilia: Secondary | ICD-10-CM | POA: Diagnosis not present

## 2018-09-15 ENCOUNTER — Other Ambulatory Visit: Payer: Self-pay

## 2018-09-15 DIAGNOSIS — I771 Stricture of artery: Principal | ICD-10-CM

## 2018-09-15 DIAGNOSIS — K551 Chronic vascular disorders of intestine: Secondary | ICD-10-CM

## 2018-09-15 DIAGNOSIS — I708 Atherosclerosis of other arteries: Secondary | ICD-10-CM

## 2018-09-18 ENCOUNTER — Ambulatory Visit (INDEPENDENT_AMBULATORY_CARE_PROVIDER_SITE_OTHER): Payer: Medicare Other | Admitting: Physician Assistant

## 2018-09-18 ENCOUNTER — Ambulatory Visit
Admission: RE | Admit: 2018-09-18 | Discharge: 2018-09-18 | Disposition: A | Discharge status: Home / Self Care | Payer: Medicare Other | Source: Ambulatory Visit | Attending: Internal Medicine | Admitting: Internal Medicine

## 2018-09-18 ENCOUNTER — Other Ambulatory Visit: Payer: Self-pay

## 2018-09-18 ENCOUNTER — Ambulatory Visit (HOSPITAL_COMMUNITY)
Admission: RE | Admit: 2018-09-18 | Discharge: 2018-09-18 | Disposition: A | Discharge status: Home / Self Care | Payer: Medicare Other | Source: Ambulatory Visit | Attending: Family | Admitting: Family

## 2018-09-18 ENCOUNTER — Encounter: Payer: Self-pay | Admitting: Family

## 2018-09-18 VITALS — BP 126/60 | HR 71 | Temp 97.2°F | Resp 16 | Ht 64.0 in | Wt 174.0 lb

## 2018-09-18 DIAGNOSIS — I708 Atherosclerosis of other arteries: Secondary | ICD-10-CM

## 2018-09-18 DIAGNOSIS — Z7901 Long term (current) use of anticoagulants: Secondary | ICD-10-CM | POA: Diagnosis not present

## 2018-09-18 DIAGNOSIS — Z8542 Personal history of malignant neoplasm of other parts of uterus: Secondary | ICD-10-CM | POA: Diagnosis not present

## 2018-09-18 DIAGNOSIS — K551 Chronic vascular disorders of intestine: Secondary | ICD-10-CM

## 2018-09-18 DIAGNOSIS — F3341 Major depressive disorder, recurrent, in partial remission: Secondary | ICD-10-CM | POA: Diagnosis not present

## 2018-09-18 DIAGNOSIS — I771 Stricture of artery: Secondary | ICD-10-CM | POA: Diagnosis not present

## 2018-09-18 DIAGNOSIS — Z1231 Encounter for screening mammogram for malignant neoplasm of breast: Secondary | ICD-10-CM | POA: Diagnosis not present

## 2018-09-18 DIAGNOSIS — T45515A Adverse effect of anticoagulants, initial encounter: Secondary | ICD-10-CM | POA: Diagnosis not present

## 2018-09-18 DIAGNOSIS — D7582 Heparin induced thrombocytopenia (HIT): Secondary | ICD-10-CM | POA: Diagnosis not present

## 2018-09-18 DIAGNOSIS — D6859 Other primary thrombophilia: Secondary | ICD-10-CM | POA: Diagnosis not present

## 2018-09-18 NOTE — Progress Notes (Signed)
History of Present Illness:  Patient is a 58 year old female who returns for follow-up today.  She previously underwent aorto to celiac and superior mesenteric artery bypass in 2004.  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  Recent duplex ultrasound showed increased velocity in the proximal graft to 302 cm/s.  She  follow-up CT angiogram showed  filling of the celiac axis distally the celiac limb of the bypass graft is chronically occluded.  The SMA portion of the bypass graft is widely patent with good filling of the distal SMA and no obvious narrowing.  She is here today for f/u mesenteric duplex.  She denise abdominal pain, V/N, no increase in lumbar pain.  She tolerates PO's well and has regular BMs.   She continues to smoke daily.    Past medical history includes:  Cad, PVD, mesenteric stenosis and chronic back pain.  History of histerectomy secondary to CA.       Past Medical History:  Diagnosis Date  . Antithrombin 3 deficiency ([place])   . Asthma   . Cancer ([place])   . Coronary artery disease   . Grave's disease   . Grave's disease   . Hyperlipidemia   . Hypertension   . Peripheral vascular disease [facility])     Past Surgical History:  Procedure Laterality Date  . ABDOMINAL HYSTERECTOMY    . ABDOMINAL SURGERY    . aorto to celiac and superior mesenteric artery bypass  11/22/2003  . COLON SURGERY    . coronary artery angio  02/2010  . thrombectomy of SMA  01/2007    ROS:   General:  No weight loss, Fever, chills  HEENT: No recent headaches, no nasal bleeding, no visual changes, no sore throat  Neurologic: No dizziness, blackouts, seizures. No recent symptoms of stroke or mini- stroke. No recent episodes of slurred speech, or temporary blindness.  Cardiac: No recent episodes of chest pain/pressure, no shortness of breath at rest.  No shortness of breath with exertion.  Denies history of atrial fibrillation or irregular heartbeat  Vascular: No history of rest pain  in feet.  No history of claudication.  No history of non-healing ulcer, No history of DVT   Pulmonary: No home oxygen, no productive cough, no hemoptysis,  No asthma or wheezing  Musculoskeletal:  [ ]  Arthritis, [ x] Low back pain,  [ ]  Joint pain  Hematologic:No history of hypercoagulable state.  No history of easy bleeding.  No history of anemia  Gastrointestinal: No hematochezia or melena,  No gastroesophageal reflux, no trouble swallowing  Urinary: [ ]  chronic Kidney disease, [ ]  on HD - [ ]  MWF or [ ]  TTHS, [ ]  Burning with urination, [ ]  Frequent urination, [ ]  Difficulty urinating;   Skin: No rashes  Psychological: No history of anxiety,  No history of depression  Social History Social History   Tobacco Use  . Smoking status: Current Every Day Smoker    Packs/day: 1.00    Types: Cigarettes  . Smokeless tobacco: Never Used  Substance Use Topics  . Alcohol use: Yes    Alcohol/week: 0.0 standard drinks    Comment: occasional  . Drug use: No    Family History Family History  Problem Relation Age of Onset  . Hypertension Mother   . Heart disease Mother   . Heart attack Mother   . Stroke Father   . Heart disease Father   . Diabetes Father   . Hypertension Father  Allergies  Allergies  Allergen Reactions  . Amitriptyline Rash  . Topiramate Nausea And Vomiting    Heart races, almost "blacked out"  . Eggs Or Egg-Derived Products     Hives   . Heparin     Hit   . Penicillins     Hives   . Poultry Meal     Hives      Current Outpatient Medications  Medication Sig Dispense Refill  . albuterol (PROVENTIL HFA;VENTOLIN HFA) 108 (90 BASE) MCG/ACT inhaler Inhale 2 puffs into the lungs every 6 (six) hours as needed. Shortness of breath    . ALPRAZolam (XANAX) 0.5 MG tablet Take 0.5 mg by mouth 3 (three) times daily as needed for anxiety or sleep.     Marland Kitchen aspirin EC 81 MG tablet Take 1 tablet (81 mg total) by mouth daily.    Marland Kitchen atorvastatin (LIPITOR) 40 MG  tablet Take 1 tablet (40 mg total) by mouth daily. 90 tablet 1  . bisacodyl (DULCOLAX) 5 MG EC tablet Take 5 mg by mouth daily.    . cetirizine (ZYRTEC) 10 MG tablet Take 10 mg by mouth daily.    . Cholecalciferol (VITAMIN D) 2000 units tablet Take 1 tablet by mouth daily.    Marland Kitchen diltiazem (CARDIZEM CD) 120 MG 24 hr capsule TAKE 1 CAPSULE BY MOUTH EVERY DAY 30 capsule 6  . isosorbide mononitrate (IMDUR) 30 MG 24 hr tablet Take 15 mg by mouth daily.    Marland Kitchen levothyroxine (SYNTHROID, LEVOTHROID) 75 MCG tablet Take 1 tablet by mouth daily.  3  . lisinopril-hydrochlorothiazide (PRINZIDE,ZESTORETIC) 20-25 MG per tablet Take 1 tablet by mouth every morning.    . methocarbamol (ROBAXIN) 750 MG tablet Take 750 mg by mouth 3 (three) times daily as needed for muscle spasms.     Marland Kitchen morphine (MS CONTIN) 15 MG 12 hr tablet Take 15 mg by mouth 3 (three) times daily.    . naloxone (NARCAN) nasal spray 4 mg/0.1 mL Place 1 spray into the nose as needed.    . nitroGLYCERIN (NITROQUICK) 0.4 MG SL tablet Place 0.4 mg under the tongue every 5 (five) minutes as needed. Chest pain    . Propylene Glycol (SYSTANE BALANCE OP) Apply 1 drop to eye as needed. For dry eyes    . venlafaxine (EFFEXOR) 37.5 MG tablet Take 37.5 mg by mouth 2 (two) times daily. Reported on 11/01/2015    . warfarin (COUMADIN) 7.5 MG tablet Take 7 mg by mouth daily. Monday Tuesday,wednesday, Friday is 5 mg. 7.5 mg on Thursday, Saturday, Sunday. Last INR check was on 10-23-13     No current facility-administered medications for this visit.     Physical Examination  Vitals:   09/18/18 0845  BP: 126/60  Pulse: 71  Resp: 16  Temp: (!) 97.2 F (36.2 C)  TempSrc: Oral  SpO2: 97%  Weight: 174 lb (78.9 kg)  Height: 5\' 4"  (1.626 m)    Body mass index is 29.87 kg/m.  General:  Alert and oriented, no acute distress HEENT: Normal, normocephalic Neck: No bruit or JVD Pulmonary: Clear to auscultation bilaterally Cardiac: Regular Rate and Rhythm  without murmur Abdomen: Soft, non-tender, non-distended, no mass, well healed central abdominal scar Skin: No rash Extremity Pulses:  2+ radial, brachial, femoral, dorsalis pedis,  pulses bilaterally Musculoskeletal: No deformity or edema  Neurologic: Upper and lower extremity motor 5/5 and symmetric  DATA:    Duplex Findings: +---------------+--------+--------+------+---------------------+ Mesenteric     PSV cm/sEDV cm/sPlaque  Comments        +---------------+--------+--------+------+---------------------+ Aorta at Celiac   81                                       +---------------+--------+--------+------+---------------------+ CHA               67                 fills via collaterals +---------------+--------+--------+------+---------------------+ Splenic           66                 fills via collaterals +---------------+--------+--------+------+---------------------+    Right Graft #1: +------------------+--------+---------------+--------+--------+ aorta to SMA      PSV cm/sStenosis       WaveformComments +------------------+--------+---------------+--------+--------+ Inflow            81                                      +------------------+--------+---------------+--------+--------+ Prox Anastomosis  182                                     +------------------+--------+---------------+--------+--------+ Proximal Graft    327     50-70% stenosis                 +------------------+--------+---------------+--------+--------+ Mid Graft                                                 +------------------+--------+---------------+--------+--------+ Distal Graft                                              +------------------+--------+---------------+--------+--------+ Distal Anastomosis                                        +------------------+--------+---------------+--------+--------+ Outflow                                                    +------------------+--------+---------------+--------+--------+    Summary: Mesenteric:   No significant change compared to prior duplex. Patent aorta- SMA bypass graft with elevated velocities seen in the proximal segment of the graft.   Right Graft: Elevated velocities suggest stenosis, however similar findings with follow up CT showed no significant stenosis.  ASSESSMENT:  Mesenteric stenosis s/p  aorto superior mesenteric artery bypass graft.  PLAN:  She has no new symptoms of ischemic bowel.  Regular eating habits without pain, N/V.  She reports regular BMs.    Patent SMA flow without significant change to her ultrasound when compared to previous ultrasound.  She will f/u in 1 year for repeat mesenteric duplex.  We reviewed symptoms of ischemia and if she has problems or concerns she will call sooner.   Roxy Horseman PA-C [facility] Office: [phone]  MD in clinic:[place]

## 2018-10-10 DIAGNOSIS — Z7901 Long term (current) use of anticoagulants: Secondary | ICD-10-CM | POA: Diagnosis not present

## 2018-10-10 DIAGNOSIS — D689 Coagulation defect, unspecified: Secondary | ICD-10-CM | POA: Diagnosis not present

## 2018-10-15 DIAGNOSIS — M47816 Spondylosis without myelopathy or radiculopathy, lumbar region: Secondary | ICD-10-CM | POA: Diagnosis not present

## 2018-10-17 DIAGNOSIS — Z7901 Long term (current) use of anticoagulants: Secondary | ICD-10-CM | POA: Diagnosis not present

## 2018-10-17 DIAGNOSIS — D689 Coagulation defect, unspecified: Secondary | ICD-10-CM | POA: Diagnosis not present

## 2018-10-31 DIAGNOSIS — D689 Coagulation defect, unspecified: Secondary | ICD-10-CM | POA: Diagnosis not present

## 2018-10-31 DIAGNOSIS — Z7901 Long term (current) use of anticoagulants: Secondary | ICD-10-CM | POA: Diagnosis not present

## 2018-11-13 DIAGNOSIS — M533 Sacrococcygeal disorders, not elsewhere classified: Secondary | ICD-10-CM | POA: Diagnosis not present

## 2018-11-13 DIAGNOSIS — M545 Low back pain: Secondary | ICD-10-CM | POA: Diagnosis not present

## 2018-11-13 DIAGNOSIS — M47816 Spondylosis without myelopathy or radiculopathy, lumbar region: Secondary | ICD-10-CM | POA: Diagnosis not present

## 2018-11-13 DIAGNOSIS — G8929 Other chronic pain: Secondary | ICD-10-CM | POA: Diagnosis not present

## 2018-11-13 DIAGNOSIS — Z720 Tobacco use: Secondary | ICD-10-CM | POA: Diagnosis not present

## 2018-11-14 DIAGNOSIS — Z7901 Long term (current) use of anticoagulants: Secondary | ICD-10-CM | POA: Diagnosis not present

## 2018-11-14 DIAGNOSIS — D689 Coagulation defect, unspecified: Secondary | ICD-10-CM | POA: Diagnosis not present

## 2018-11-26 DIAGNOSIS — Z7901 Long term (current) use of anticoagulants: Secondary | ICD-10-CM | POA: Diagnosis not present

## 2018-11-26 DIAGNOSIS — M5412 Radiculopathy, cervical region: Secondary | ICD-10-CM | POA: Diagnosis not present

## 2018-12-04 DIAGNOSIS — M5412 Radiculopathy, cervical region: Secondary | ICD-10-CM | POA: Diagnosis not present

## 2018-12-04 DIAGNOSIS — Z7901 Long term (current) use of anticoagulants: Secondary | ICD-10-CM | POA: Diagnosis not present

## 2018-12-10 DIAGNOSIS — M47817 Spondylosis without myelopathy or radiculopathy, lumbosacral region: Secondary | ICD-10-CM | POA: Diagnosis not present

## 2018-12-11 DIAGNOSIS — Z7901 Long term (current) use of anticoagulants: Secondary | ICD-10-CM | POA: Diagnosis not present

## 2018-12-11 DIAGNOSIS — M5412 Radiculopathy, cervical region: Secondary | ICD-10-CM | POA: Diagnosis not present

## 2018-12-16 DIAGNOSIS — E039 Hypothyroidism, unspecified: Secondary | ICD-10-CM | POA: Diagnosis not present

## 2018-12-16 DIAGNOSIS — I2510 Atherosclerotic heart disease of native coronary artery without angina pectoris: Secondary | ICD-10-CM | POA: Diagnosis not present

## 2018-12-16 DIAGNOSIS — C55 Malignant neoplasm of uterus, part unspecified: Secondary | ICD-10-CM | POA: Diagnosis not present

## 2018-12-16 DIAGNOSIS — Z7901 Long term (current) use of anticoagulants: Secondary | ICD-10-CM | POA: Diagnosis not present

## 2018-12-16 DIAGNOSIS — K559 Vascular disorder of intestine, unspecified: Secondary | ICD-10-CM | POA: Diagnosis not present

## 2018-12-16 DIAGNOSIS — I739 Peripheral vascular disease, unspecified: Secondary | ICD-10-CM | POA: Diagnosis not present

## 2018-12-16 DIAGNOSIS — G8929 Other chronic pain: Secondary | ICD-10-CM | POA: Diagnosis not present

## 2018-12-16 DIAGNOSIS — F3341 Major depressive disorder, recurrent, in partial remission: Secondary | ICD-10-CM | POA: Diagnosis not present

## 2018-12-16 DIAGNOSIS — F419 Anxiety disorder, unspecified: Secondary | ICD-10-CM | POA: Diagnosis not present

## 2018-12-16 DIAGNOSIS — E1169 Type 2 diabetes mellitus with other specified complication: Secondary | ICD-10-CM | POA: Diagnosis not present

## 2018-12-16 DIAGNOSIS — D6859 Other primary thrombophilia: Secondary | ICD-10-CM | POA: Diagnosis not present

## 2018-12-17 DIAGNOSIS — I739 Peripheral vascular disease, unspecified: Secondary | ICD-10-CM | POA: Diagnosis not present

## 2018-12-17 DIAGNOSIS — R7309 Other abnormal glucose: Secondary | ICD-10-CM | POA: Diagnosis not present

## 2018-12-17 DIAGNOSIS — I2510 Atherosclerotic heart disease of native coronary artery without angina pectoris: Secondary | ICD-10-CM | POA: Diagnosis not present

## 2018-12-17 DIAGNOSIS — E039 Hypothyroidism, unspecified: Secondary | ICD-10-CM | POA: Diagnosis not present

## 2018-12-17 DIAGNOSIS — F419 Anxiety disorder, unspecified: Secondary | ICD-10-CM | POA: Diagnosis not present

## 2018-12-24 DIAGNOSIS — D689 Coagulation defect, unspecified: Secondary | ICD-10-CM | POA: Diagnosis not present

## 2018-12-24 DIAGNOSIS — Z79899 Other long term (current) drug therapy: Secondary | ICD-10-CM | POA: Diagnosis not present

## 2018-12-24 DIAGNOSIS — Z7901 Long term (current) use of anticoagulants: Secondary | ICD-10-CM | POA: Diagnosis not present

## 2018-12-25 DIAGNOSIS — M533 Sacrococcygeal disorders, not elsewhere classified: Secondary | ICD-10-CM | POA: Diagnosis not present

## 2018-12-25 DIAGNOSIS — M5417 Radiculopathy, lumbosacral region: Secondary | ICD-10-CM | POA: Diagnosis not present

## 2018-12-25 DIAGNOSIS — M47816 Spondylosis without myelopathy or radiculopathy, lumbar region: Secondary | ICD-10-CM | POA: Diagnosis not present

## 2018-12-30 DIAGNOSIS — Z7901 Long term (current) use of anticoagulants: Secondary | ICD-10-CM | POA: Diagnosis not present

## 2018-12-30 DIAGNOSIS — D689 Coagulation defect, unspecified: Secondary | ICD-10-CM | POA: Diagnosis not present

## 2018-12-30 DIAGNOSIS — Z79899 Other long term (current) drug therapy: Secondary | ICD-10-CM | POA: Diagnosis not present

## 2019-01-02 DIAGNOSIS — Z7901 Long term (current) use of anticoagulants: Secondary | ICD-10-CM | POA: Diagnosis not present

## 2019-01-02 DIAGNOSIS — D689 Coagulation defect, unspecified: Secondary | ICD-10-CM | POA: Diagnosis not present

## 2019-01-02 DIAGNOSIS — Z79899 Other long term (current) drug therapy: Secondary | ICD-10-CM | POA: Diagnosis not present

## 2019-01-07 ENCOUNTER — Other Ambulatory Visit: Payer: Self-pay | Admitting: Cardiology

## 2019-01-15 DIAGNOSIS — Z7901 Long term (current) use of anticoagulants: Secondary | ICD-10-CM | POA: Diagnosis not present

## 2019-01-15 DIAGNOSIS — D689 Coagulation defect, unspecified: Secondary | ICD-10-CM | POA: Diagnosis not present

## 2019-01-15 DIAGNOSIS — Z79899 Other long term (current) drug therapy: Secondary | ICD-10-CM | POA: Diagnosis not present

## 2019-01-16 DIAGNOSIS — M47817 Spondylosis without myelopathy or radiculopathy, lumbosacral region: Secondary | ICD-10-CM | POA: Diagnosis not present

## 2019-01-20 ENCOUNTER — Telehealth: Payer: Self-pay

## 2019-01-20 NOTE — Telephone Encounter (Signed)
YOUR CARDIOLOGY TEAM HAS ARRANGED FOR AN E-VISIT FOR YOUR APPOINTMENT - PLEASE REVIEW IMPORTANT INFORMATION BELOW SEVERAL DAYS PRIOR TO YOUR APPOINTMENT  Due to the recent COVID-19 pandemic, we are transitioning in-person office visits to tele-medicine visits in an effort to decrease unnecessary exposure to our patients, their families, and staff. These visits are billed to your insurance just like a normal visit is. We also encourage you to sign up for MyChart if you have not already done so. You will need a smartphone if possible. For patients that do not have this, we can still complete the visit using a regular telephone but do prefer a smartphone to enable video when possible. You may have a family member that lives with you that can help. If possible, we also ask that you have a blood pressure cuff and scale at home to measure your blood pressure, heart rate and weight prior to your scheduled appointment. Patients with clinical needs that need an in-person evaluation and testing will still be able to come to the office if absolutely necessary. If you have any questions, feel free to call our office.     YOUR PROVIDER WILL BE USING THE FOLLOWING PLATFORM TO COMPLETE YOUR VISIT: [email or web address]  . IF USING MYCHART - How to Download the MyChart App to Your SmartPhone   - If Apple, go to App Store and type in MyChart in the search bar and download the app. If Android, ask patient to go to Google Play Store and type in MyChart in the search bar and download the app. The app is free but as with any other app downloads, your phone may require you to verify saved payment information or Apple/Android password.  - You will need to then log into the app with your MyChart username and password, and select [facility] as your healthcare provider to link the account.  - When it is time for your visit, go to the MyChart app, find appointments, and click Begin Video Visit. Be sure to Select Allow for your device to  access the Microphone and Camera for your visit. You will then be connected, and your provider will be with you shortly.  **If you have any issues connecting or need assistance, please contact MyChart service desk [phone] ([phone])**  **If using a computer, in order to ensure the best quality for your visit, you will need to use either of the following Internet Browsers: Google Chrome or Microsoft Edge**  . IF USING DOXIMITY or [email or web address] - The staff will give you instructions on receiving your link to join the meeting the day of your visit.      2-3 DAYS BEFORE YOUR APPOINTMENT  You will receive a telephone call from one of our [facility] team members - your caller ID may say "Unknown caller." If this is a video visit, we will walk you through how to get the video launched on your phone. We will remind you check your blood pressure, heart rate and weight prior to your scheduled appointment. If you have an Apple Watch or Kardia, please upload any pertinent ECG strips the day before or morning of your appointment to MyChart. Our staff will also make sure you have reviewed the consent and agree to move forward with your scheduled tele-health visit.     THE DAY OF YOUR APPOINTMENT  Approximately 15 minutes prior to your scheduled appointment, you will receive a telephone call from one of Heart[facility] team - your caller ID may say "Unknown caller."    Our staff will confirm medications, vital signs for the day and any symptoms you may be experiencing. Please have this information available prior to the time of visit start. It may also be helpful for you to have a pad of paper and pen handy for any instructions given during your visit. They will also walk you through joining the smartphone meeting if this is a video visit.    CONSENT FOR TELE-HEALTH VISIT - PLEASE REVIEW  I hereby voluntarily request, consent and authorize [facility] and its employed or contracted physicians, physician  assistants, nurse practitioners or other licensed health care professionals (the Practitioner), to provide me with telemedicine health care services (the "Services") as deemed necessary by the treating Practitioner. I acknowledge and consent to receive the Services by the Practitioner via telemedicine. I understand that the telemedicine visit will involve communicating with the Practitioner through live audiovisual communication technology and the disclosure of certain medical information by electronic transmission. I acknowledge that I have been given the opportunity to request an in-person assessment or other available alternative prior to the telemedicine visit and am voluntarily participating in the telemedicine visit.  I understand that I have the right to withhold or withdraw my consent to the use of telemedicine in the course of my care at any time, without affecting my right to future care or treatment, and that the Practitioner or I may terminate the telemedicine visit at any time. I understand that I have the right to inspect all information obtained and/or recorded in the course of the telemedicine visit and may receive copies of available information for a reasonable fee.  I understand that some of the potential risks of receiving the Services via telemedicine include:  . Delay or interruption in medical evaluation due to technological equipment failure or disruption; . Information transmitted may not be sufficient (e.g. poor resolution of images) to allow for appropriate medical decision making by the Practitioner; and/or  . In rare instances, security protocols could fail, causing a breach of personal health information.  Furthermore, I acknowledge that it is my responsibility to provide information about my medical history, conditions and care that is complete and accurate to the best of my ability. I acknowledge that Practitioner's advice, recommendations, and/or decision may be based on  factors not within their control, such as incomplete or inaccurate data provided by me or distortions of diagnostic images or specimens that may result from electronic transmissions. I understand that the practice of medicine is not an exact science and that Practitioner makes no warranties or guarantees regarding treatment outcomes. I acknowledge that I will receive a copy of this consent concurrently upon execution via email to the email address I last provided but may also request a printed copy by calling the office of [facility].    I understand that my insurance will be billed for this visit.   I have read or had this consent read to me. . I understand the contents of this consent, which adequately explains the benefits and risks of the Services being provided via telemedicine.  . I have been provided ample opportunity to ask questions regarding this consent and the Services and have had my questions answered to my satisfaction. . I give my informed consent for the services to be provided through the use of telemedicine in my medical care  By participating in this telemedicine visit I agree to the above.  

## 2019-01-21 DIAGNOSIS — Z7901 Long term (current) use of anticoagulants: Secondary | ICD-10-CM | POA: Diagnosis not present

## 2019-01-21 DIAGNOSIS — Z79899 Other long term (current) drug therapy: Secondary | ICD-10-CM | POA: Diagnosis not present

## 2019-01-21 DIAGNOSIS — D689 Coagulation defect, unspecified: Secondary | ICD-10-CM | POA: Diagnosis not present

## 2019-01-23 ENCOUNTER — Telehealth: Payer: Medicare Other | Admitting: Cardiology

## 2019-01-26 ENCOUNTER — Other Ambulatory Visit: Payer: Self-pay

## 2019-01-26 ENCOUNTER — Encounter: Payer: Self-pay | Admitting: Cardiology

## 2019-01-26 ENCOUNTER — Telehealth (INDEPENDENT_AMBULATORY_CARE_PROVIDER_SITE_OTHER): Payer: Medicare Other | Admitting: Cardiology

## 2019-01-26 VITALS — BP 145/53 | HR 81 | Temp 98.5°F | Ht 64.0 in | Wt 167.0 lb

## 2019-01-26 DIAGNOSIS — I209 Angina pectoris, unspecified: Secondary | ICD-10-CM

## 2019-01-26 DIAGNOSIS — I708 Atherosclerosis of other arteries: Secondary | ICD-10-CM

## 2019-01-26 DIAGNOSIS — I739 Peripheral vascular disease, unspecified: Secondary | ICD-10-CM

## 2019-01-26 DIAGNOSIS — I2510 Atherosclerotic heart disease of native coronary artery without angina pectoris: Secondary | ICD-10-CM

## 2019-01-26 DIAGNOSIS — K551 Chronic vascular disorders of intestine: Secondary | ICD-10-CM

## 2019-01-26 DIAGNOSIS — D6859 Other primary thrombophilia: Secondary | ICD-10-CM

## 2019-01-26 NOTE — Patient Instructions (Signed)
Medication Instructions:  Your physician recommends that you continue on your current medications as directed. Please refer to the Current Medication list given to you today.  If you need a refill on your cardiac medications before your next appointment, please call your pharmacy.   Lab work: None ordered If you have labs (blood work) drawn today and your tests are completely normal, you will receive your results only by: Marland Kitchen MyChart Message (if you have MyChart) OR . A paper copy in the mail If you have any lab test that is abnormal or we need to change your treatment, we will call you to review the results.  Testing/Procedures: Your physician has requested that you have a lexiscan myoview. For further information please visit [email or web address]. Please follow instruction sheet, as given.  Will call you to make the appointment... will go over instructions and mail to you.    Follow-Up:Your physician wants you to follow-up in: one year with Dr. Marlou Porch. You will receive a reminder letter in the mail two months in advance. If you don't receive a letter, please call our office to schedule the follow-up appointment.  At [facility], you and your health needs are our priority.  As part of our continuing mission to provide you with exceptional heart care, we have created designated Provider Care Teams.  These Care Teams include your primary Cardiologist (physician) and Advanced Practice Providers (APPs -  Physician Assistants and Nurse Practitioners) who all work together to provide you with the care you need, when you need it. You will need a follow up appointment in 1 year.  Please call our office 2 months in advance to schedule this appointment.  You may see Candee Furbish, MD or one of the following Advanced Practice Providers on your designated Care Team:   Truitt Merle, NP Cecilie Kicks, NP . Kathyrn Drown, NP  Any Other Special Instructions Will Be Listed Below (If Applicable).

## 2019-01-26 NOTE — Progress Notes (Signed)
Virtual Visit via Video Note   This visit type was conducted due to national recommendations for restrictions regarding the COVID-19 Pandemic (e.g. social distancing) in an effort to limit this patient's exposure and mitigate transmission in our community.  Due to her co-morbid illnesses, this patient is at least at moderate risk for complications without adequate follow up.  This format is felt to be most appropriate for this patient at this time.  All issues noted in this document were discussed and addressed.  A limited physical exam was performed with this format.  Please refer to the patient's chart for her consent to telehealth for [facility].   Date:  01/26/2019   ID:  Danielle Barrett, DOB 06-15-1961, MRN [id number]  Patient Location: Home Provider Location: Home  PCP:  [name], MD  Cardiologist:  Candee Furbish, MD  Electrophysiologist:  None   Evaluation Performed:  Follow-Up Visit  Chief Complaint:  Chest pain  History of Present Illness:    Danielle Barrett is a 58 y.o. female with peripheral vascular disease with prior history of mesenteric ischemia with prior SMA bypass graft with history of heparin-induced thrombocytopenia, anxiety, coronary artery disease with Cutting Balloon angioplasty in June 2011 no stent placed here for follow-up.  Has been on chronic Coumadin therapy because of previous mesenteric ischemia and celiac bypass.  She was also diagnosed with Antithrombin III deficiency.  Smoker.  No prior history of stroke.  Angina and improved.  Had headaches on high-dose isosorbide.  Occasionally would have sensation of palpitations or heart racing when laying down lasting a few seconds duration.  She now is complaining of occasional centralized chest tightness with radiation to her neck moderate severity in situations of stress.  Relieved with rest.   The patient does not have symptoms concerning for COVID-19 infection (fever, chills, cough, or new shortness of  breath).    Past Medical History:  Diagnosis Date  . Antithrombin 3 deficiency ([place])   . Asthma   . Cancer ([place])   . Coronary artery disease   . Grave's disease   . Grave's disease   . Hyperlipidemia   . Hypertension   . Peripheral vascular disease [facility])    Past Surgical History:  Procedure Laterality Date  . ABDOMINAL HYSTERECTOMY    . ABDOMINAL SURGERY    . aorto to celiac and superior mesenteric artery bypass  11/22/2003  . COLON SURGERY    . coronary artery angio  02/2010  . thrombectomy of SMA  01/2007     Current Meds  Medication Sig  . albuterol (PROVENTIL HFA;VENTOLIN HFA) 108 (90 BASE) MCG/ACT inhaler Inhale 2 puffs into the lungs every 6 (six) hours as needed. Shortness of breath  . ALPRAZolam (XANAX) 0.5 MG tablet Take 0.5 mg by mouth 3 (three) times daily as needed for anxiety or sleep.   Marland Kitchen aspirin EC 81 MG tablet Take 1 tablet (81 mg total) by mouth daily.  Marland Kitchen atorvastatin (LIPITOR) 40 MG tablet TAKE 1 TABLET BY MOUTH EVERY DAY  . bisacodyl (DULCOLAX) 5 MG EC tablet Take 5 mg by mouth daily.  . cetirizine (ZYRTEC) 10 MG tablet Take 10 mg by mouth daily.  Marland Kitchen diltiazem (CARDIZEM CD) 120 MG 24 hr capsule TAKE 1 CAPSULE BY MOUTH EVERY DAY  . isosorbide mononitrate (IMDUR) 30 MG 24 hr tablet Take 15 mg by mouth daily.  Marland Kitchen levothyroxine (SYNTHROID) 100 MCG tablet Take 100 mcg by mouth daily.  Marland Kitchen lisinopril-hydrochlorothiazide (PRINZIDE,ZESTORETIC) 20-25 MG per tablet  Take 1 tablet by mouth every morning.  . methocarbamol (ROBAXIN) 750 MG tablet Take 750 mg by mouth 3 (three) times daily as needed for muscle spasms.   Marland Kitchen morphine (MS CONTIN) 15 MG 12 hr tablet Take 15 mg by mouth 3 (three) times daily.  . naloxone (NARCAN) nasal spray 4 mg/0.1 mL Place 1 spray into the nose as needed.  . nitroGLYCERIN (NITROQUICK) 0.4 MG SL tablet Place 0.4 mg under the tongue every 5 (five) minutes as needed. Chest pain  . Propylene Glycol (SYSTANE BALANCE OP) Apply 1 drop to eye as needed.  For dry eyes  . venlafaxine (EFFEXOR) 37.5 MG tablet Take 37.5 mg by mouth 2 (two) times daily. Reported on 11/01/2015  . warfarin (COUMADIN) 7.5 MG tablet Take 7 mg by mouth daily. Monday Tuesday,wednesday, Friday is 5 mg. 7.5 mg on Thursday, Saturday, Sunday. Last INR check was on 10-23-13  . [DISCONTINUED] levothyroxine (SYNTHROID, LEVOTHROID) 75 MCG tablet Take 1 tablet by mouth daily.     Allergies:   Amitriptyline; Topiramate; Eggs or egg-derived products; Heparin; Penicillins; and Poultry meal   Social History   Tobacco Use  . Smoking status: Current Every Day Smoker    Packs/day: 1.00    Types: Cigarettes  . Smokeless tobacco: Never Used  Substance Use Topics  . Alcohol use: Yes    Alcohol/week: 0.0 standard drinks    Comment: occasional  . Drug use: No     Family Hx: The patient's family history includes Diabetes in her father; Heart attack in her mother; Heart disease in her father and mother; Hypertension in her father and mother; Stroke in her father.  ROS:   Please see the history of present illness.    No fevers chills nausea vomiting syncope bleeding All other systems reviewed and are negative.   Prior CV studies:   The following studies were reviewed today:  02/10/10 - Cath -PTCA only to 90% ostial diagonal branch.  40% mid LAD stenosis residual.  Small vessel obtuse marginal disease also noted.  Labs/Other Tests and Data Reviewed:    EKG:  An ECG dated 02/13/2018 was personally reviewed today and demonstrated:  Sinus rhythm low normal voltage otherwise unremarkable 83   Recent Labs: No results found for requested labs within last 8760 hours.   Recent Lipid Panel Lab Results  Component Value Date/Time   CHOL (H) 12/22/2009 06:35 AM    238        ATP III CLASSIFICATION:  <200     mg/dL   Desirable  200-239  mg/dL   Borderline High  >=240    mg/dL   High          TRIG 382 (H) 12/22/2009 06:35 AM   HDL 30 (L) 12/22/2009 06:35 AM   CHOLHDL 7.9 12/22/2009  06:35 AM   LDLCALC (H) 12/22/2009 06:35 AM    132        Total Cholesterol/HDL:CHD Risk Coronary Heart Disease Risk Table                     Men   Women  1/2 Average Risk   3.4   3.3  Average Risk       5.0   4.4  2 X Average Risk   9.6   7.1  3 X Average Risk  23.4   11.0        Use the calculated Patient Ratio above and the CHD Risk Table to determine the patient's CHD  Risk.        ATP III CLASSIFICATION (LDL):  <100     mg/dL   Optimal  100-129  mg/dL   Near or Above                    Optimal  130-159  mg/dL   Borderline  160-189  mg/dL   High  >190     mg/dL   Very High    Wt Readings from Last 3 Encounters:  01/26/19 167 lb (75.8 kg)  09/18/18 174 lb (78.9 kg)  03/12/18 170 lb (77.1 kg)     Objective:    Vital Signs:  BP (!) 145/53   Pulse 81   Temp 98.5 F (36.9 C)   Ht 5\' 4"  (1.626 m)   Wt 167 lb (75.8 kg)   LMP  (LMP Unknown)   BMI 28.67 kg/m    VITAL SIGNS:  reviewed GEN:  no acute distress EYES:  sclerae anicteric, EOMI - Extraocular Movements Intact RESPIRATORY:  normal respiratory effort, symmetric expansion SKIN:  no rash, lesions or ulcers. MUSCULOSKELETAL:  no obvious deformities. NEURO:  alert and oriented x 3, no obvious focal deficit PSYCH:  normal affect  ASSESSMENT & PLAN:    Coronary artery disease with angina - Prior PTCA only with no PCI of obtuse marginal branch in 2011. - Has been taking low-dose isosorbide.  Diltiazem.   -She has been experiencing occasional episodes of centralized chest discomfort radiating up to her neck especially during periods of stress.  Because of this, I will go ahead and check a Lexiscan pharmacologic nuclear stress test to make sure that she is not have any high risk evidence of ischemia.    Peripheral vascular disease - SMA bypass.  Dr. Oneida Alar.  Warfarin.  Stable on prior CT.  Celiac branch chronic occlusion  Chronic anticoagulation - Continues to get her warfarin checked in [place] with [facility].  Occasionally needed to be on Lovenox bridge is because of back injections and nerve ablations.  Tobacco use - Previously both she and her husband would like to try to quit together.  Currently it is been quite stressful with her son and his child with special needs that may need to come and live with her.  COVID-19 Education: The signs and symptoms of COVID-19 were discussed with the patient and how to seek care for testing (follow up with PCP or arrange E-visit).  The importance of social distancing was discussed today.  Time:   Today, I have spent 15 minutes with the patient with telehealth technology discussing the above problems.     Medication Adjustments/Labs and Tests Ordered: Current medicines are reviewed at length with the patient today.  Concerns regarding medicines are outlined above.   Tests Ordered: Orders Placed This Encounter  Procedures  . Myocardial Perfusion Imaging    Medication Changes: No orders of the defined types were placed in this encounter.   Disposition:  Follow up in 1 year(s)  Signed, Candee Furbish, MD  01/26/2019 3:48 PM    [place]

## 2019-01-28 DIAGNOSIS — Z79899 Other long term (current) drug therapy: Secondary | ICD-10-CM | POA: Diagnosis not present

## 2019-01-28 DIAGNOSIS — D689 Coagulation defect, unspecified: Secondary | ICD-10-CM | POA: Diagnosis not present

## 2019-01-28 DIAGNOSIS — Z7901 Long term (current) use of anticoagulants: Secondary | ICD-10-CM | POA: Diagnosis not present

## 2019-02-03 DIAGNOSIS — E039 Hypothyroidism, unspecified: Secondary | ICD-10-CM | POA: Diagnosis not present

## 2019-02-06 ENCOUNTER — Ambulatory Visit: Payer: Medicare Other | Admitting: Cardiology

## 2019-02-07 ENCOUNTER — Other Ambulatory Visit: Payer: Self-pay | Admitting: Cardiology

## 2019-02-07 DIAGNOSIS — I209 Angina pectoris, unspecified: Secondary | ICD-10-CM

## 2019-02-09 DIAGNOSIS — M5412 Radiculopathy, cervical region: Secondary | ICD-10-CM | POA: Diagnosis not present

## 2019-02-09 DIAGNOSIS — M533 Sacrococcygeal disorders, not elsewhere classified: Secondary | ICD-10-CM | POA: Diagnosis not present

## 2019-02-09 DIAGNOSIS — Z720 Tobacco use: Secondary | ICD-10-CM | POA: Diagnosis not present

## 2019-02-09 DIAGNOSIS — M79671 Pain in right foot: Secondary | ICD-10-CM | POA: Diagnosis not present

## 2019-02-13 DIAGNOSIS — Z7901 Long term (current) use of anticoagulants: Secondary | ICD-10-CM | POA: Diagnosis not present

## 2019-02-13 DIAGNOSIS — Z79899 Other long term (current) drug therapy: Secondary | ICD-10-CM | POA: Diagnosis not present

## 2019-02-13 DIAGNOSIS — D689 Coagulation defect, unspecified: Secondary | ICD-10-CM | POA: Diagnosis not present

## 2019-02-23 ENCOUNTER — Telehealth (HOSPITAL_COMMUNITY): Payer: Self-pay | Admitting: *Deleted

## 2019-02-23 NOTE — Telephone Encounter (Signed)
Patient given detailed instructions per Myocardial Perfusion Study Information Sheet for the test on  02/27/19. Patient notified to arrive 15 minutes early and that it is imperative to arrive on time for appointment to keep from having the test rescheduled.  If you need to cancel or reschedule your appointment, please call the office within 24 hours of your appointment. . Patient verbalized understanding. Kirstie Peri

## 2019-02-25 DIAGNOSIS — E119 Type 2 diabetes mellitus without complications: Secondary | ICD-10-CM | POA: Diagnosis not present

## 2019-02-25 DIAGNOSIS — H5213 Myopia, bilateral: Secondary | ICD-10-CM | POA: Diagnosis not present

## 2019-02-25 DIAGNOSIS — H524 Presbyopia: Secondary | ICD-10-CM | POA: Diagnosis not present

## 2019-02-25 DIAGNOSIS — H52223 Regular astigmatism, bilateral: Secondary | ICD-10-CM | POA: Diagnosis not present

## 2019-02-27 ENCOUNTER — Ambulatory Visit (HOSPITAL_COMMUNITY): Payer: Medicare Other | Attending: Cardiology

## 2019-02-27 ENCOUNTER — Other Ambulatory Visit: Payer: Self-pay

## 2019-02-27 DIAGNOSIS — I771 Stricture of artery: Secondary | ICD-10-CM

## 2019-02-27 DIAGNOSIS — Z7901 Long term (current) use of anticoagulants: Secondary | ICD-10-CM | POA: Diagnosis not present

## 2019-02-27 DIAGNOSIS — I2510 Atherosclerotic heart disease of native coronary artery without angina pectoris: Secondary | ICD-10-CM | POA: Diagnosis not present

## 2019-02-27 DIAGNOSIS — K551 Chronic vascular disorders of intestine: Secondary | ICD-10-CM

## 2019-02-27 DIAGNOSIS — D689 Coagulation defect, unspecified: Secondary | ICD-10-CM | POA: Diagnosis not present

## 2019-02-27 LAB — MYOCARDIAL PERFUSION IMAGING
LV dias vol: 45 mL (ref 46–106)
LV sys vol: 9 mL
Peak HR: 95 {beats}/min
Rest HR: 79 {beats}/min
SDS: 0
SRS: 2
SSS: 3
TID: 0.96

## 2019-02-27 MED ORDER — TECHNETIUM TC 99M TETROFOSMIN IV KIT
11.0000 | PACK | Freq: Once | INTRAVENOUS | Status: AC | PRN
  Administered 2019-02-27: 11 via INTRAVENOUS
  Filled 2019-02-27: qty 11

## 2019-02-27 MED ORDER — TECHNETIUM TC 99M TETROFOSMIN IV KIT
32.3000 | PACK | Freq: Once | INTRAVENOUS | Status: AC | PRN
  Administered 2019-02-27: 32.3 via INTRAVENOUS
  Filled 2019-02-27: qty 33

## 2019-02-27 MED ORDER — REGADENOSON 0.4 MG/5ML IV SOLN
0.4000 mg | Freq: Once | INTRAVENOUS | Status: AC
  Administered 2019-02-27: 0.4 mg via INTRAVENOUS

## 2019-03-02 ENCOUNTER — Telehealth: Payer: Self-pay

## 2019-03-02 NOTE — Telephone Encounter (Signed)
Pt is aware of results of myoview

## 2019-03-02 NOTE — Telephone Encounter (Signed)
Notes recorded by Frederik Schmidt, RN on 03/02/2019 at 8:26 AM EDT  Left the patient a message with results.. If questions, she may call. 7/13  ------

## 2019-03-02 NOTE — Telephone Encounter (Signed)
Follow up ° ° °Patient is returning call for results. Please call. °

## 2019-03-02 NOTE — Telephone Encounter (Signed)
-----   Message from [name], MD sent at 02/28/2019  6:53 AM EDT ----- Excellent stress test, no ischemia. Normal pump function Candee Furbish, MD

## 2019-03-10 DIAGNOSIS — Z5181 Encounter for therapeutic drug level monitoring: Secondary | ICD-10-CM | POA: Diagnosis not present

## 2019-03-10 DIAGNOSIS — Z79899 Other long term (current) drug therapy: Secondary | ICD-10-CM | POA: Diagnosis not present

## 2019-03-10 DIAGNOSIS — M7062 Trochanteric bursitis, left hip: Secondary | ICD-10-CM | POA: Diagnosis not present

## 2019-03-10 DIAGNOSIS — M25551 Pain in right hip: Secondary | ICD-10-CM | POA: Diagnosis not present

## 2019-03-10 DIAGNOSIS — M533 Sacrococcygeal disorders, not elsewhere classified: Secondary | ICD-10-CM | POA: Diagnosis not present

## 2019-03-10 DIAGNOSIS — M7061 Trochanteric bursitis, right hip: Secondary | ICD-10-CM | POA: Diagnosis not present

## 2019-03-27 DIAGNOSIS — Z7901 Long term (current) use of anticoagulants: Secondary | ICD-10-CM | POA: Diagnosis not present

## 2019-03-27 DIAGNOSIS — Z79899 Other long term (current) drug therapy: Secondary | ICD-10-CM | POA: Diagnosis not present

## 2019-03-27 DIAGNOSIS — D689 Coagulation defect, unspecified: Secondary | ICD-10-CM | POA: Diagnosis not present

## 2019-04-24 DIAGNOSIS — Z7901 Long term (current) use of anticoagulants: Secondary | ICD-10-CM | POA: Diagnosis not present

## 2019-05-05 DIAGNOSIS — M533 Sacrococcygeal disorders, not elsewhere classified: Secondary | ICD-10-CM | POA: Diagnosis not present

## 2019-05-05 DIAGNOSIS — N94819 Vulvodynia, unspecified: Secondary | ICD-10-CM | POA: Diagnosis not present

## 2019-05-05 DIAGNOSIS — M5440 Lumbago with sciatica, unspecified side: Secondary | ICD-10-CM | POA: Diagnosis not present

## 2019-05-05 DIAGNOSIS — M47816 Spondylosis without myelopathy or radiculopathy, lumbar region: Secondary | ICD-10-CM | POA: Diagnosis not present

## 2019-05-22 DIAGNOSIS — Z79899 Other long term (current) drug therapy: Secondary | ICD-10-CM | POA: Diagnosis not present

## 2019-05-22 DIAGNOSIS — Z7901 Long term (current) use of anticoagulants: Secondary | ICD-10-CM | POA: Diagnosis not present

## 2019-05-22 DIAGNOSIS — D689 Coagulation defect, unspecified: Secondary | ICD-10-CM | POA: Diagnosis not present

## 2019-06-19 DIAGNOSIS — Z79899 Other long term (current) drug therapy: Secondary | ICD-10-CM | POA: Diagnosis not present

## 2019-06-19 DIAGNOSIS — D689 Coagulation defect, unspecified: Secondary | ICD-10-CM | POA: Diagnosis not present

## 2019-06-19 DIAGNOSIS — Z7901 Long term (current) use of anticoagulants: Secondary | ICD-10-CM | POA: Diagnosis not present

## 2019-07-01 DIAGNOSIS — M5440 Lumbago with sciatica, unspecified side: Secondary | ICD-10-CM | POA: Diagnosis not present

## 2019-07-01 DIAGNOSIS — M7061 Trochanteric bursitis, right hip: Secondary | ICD-10-CM | POA: Diagnosis not present

## 2019-07-01 DIAGNOSIS — M533 Sacrococcygeal disorders, not elsewhere classified: Secondary | ICD-10-CM | POA: Diagnosis not present

## 2019-07-01 DIAGNOSIS — M5412 Radiculopathy, cervical region: Secondary | ICD-10-CM | POA: Diagnosis not present

## 2019-07-03 DIAGNOSIS — Z7901 Long term (current) use of anticoagulants: Secondary | ICD-10-CM | POA: Diagnosis not present

## 2019-07-21 DIAGNOSIS — Z7901 Long term (current) use of anticoagulants: Secondary | ICD-10-CM | POA: Diagnosis not present

## 2019-08-07 DIAGNOSIS — Z7901 Long term (current) use of anticoagulants: Secondary | ICD-10-CM | POA: Diagnosis not present

## 2019-08-28 DIAGNOSIS — D689 Coagulation defect, unspecified: Secondary | ICD-10-CM | POA: Diagnosis not present

## 2019-08-28 DIAGNOSIS — Z7901 Long term (current) use of anticoagulants: Secondary | ICD-10-CM | POA: Diagnosis not present

## 2019-08-28 DIAGNOSIS — Z122 Encounter for screening for malignant neoplasm of respiratory organs: Secondary | ICD-10-CM | POA: Diagnosis not present

## 2019-08-28 DIAGNOSIS — F17219 Nicotine dependence, cigarettes, with unspecified nicotine-induced disorders: Secondary | ICD-10-CM | POA: Diagnosis not present

## 2019-09-17 DIAGNOSIS — Z7901 Long term (current) use of anticoagulants: Secondary | ICD-10-CM | POA: Diagnosis not present

## 2019-09-17 DIAGNOSIS — F3341 Major depressive disorder, recurrent, in partial remission: Secondary | ICD-10-CM | POA: Diagnosis not present

## 2019-09-17 DIAGNOSIS — Z122 Encounter for screening for malignant neoplasm of respiratory organs: Secondary | ICD-10-CM | POA: Diagnosis not present

## 2019-09-17 DIAGNOSIS — M533 Sacrococcygeal disorders, not elsewhere classified: Secondary | ICD-10-CM | POA: Diagnosis not present

## 2019-09-17 DIAGNOSIS — F17219 Nicotine dependence, cigarettes, with unspecified nicotine-induced disorders: Secondary | ICD-10-CM | POA: Diagnosis not present

## 2019-09-17 DIAGNOSIS — M4696 Unspecified inflammatory spondylopathy, lumbar region: Secondary | ICD-10-CM | POA: Diagnosis not present

## 2019-09-17 DIAGNOSIS — D6859 Other primary thrombophilia: Secondary | ICD-10-CM | POA: Diagnosis not present

## 2019-09-17 DIAGNOSIS — D7582 Heparin induced thrombocytopenia (HIT): Secondary | ICD-10-CM | POA: Diagnosis not present

## 2019-09-17 DIAGNOSIS — G894 Chronic pain syndrome: Secondary | ICD-10-CM | POA: Diagnosis not present

## 2019-09-17 DIAGNOSIS — D689 Coagulation defect, unspecified: Secondary | ICD-10-CM | POA: Diagnosis not present

## 2019-09-17 DIAGNOSIS — C549 Malignant neoplasm of corpus uteri, unspecified: Secondary | ICD-10-CM | POA: Diagnosis not present

## 2019-09-23 DIAGNOSIS — M5440 Lumbago with sciatica, unspecified side: Secondary | ICD-10-CM | POA: Diagnosis not present

## 2019-09-23 DIAGNOSIS — Z5181 Encounter for therapeutic drug level monitoring: Secondary | ICD-10-CM | POA: Diagnosis not present

## 2019-09-23 DIAGNOSIS — Z79899 Other long term (current) drug therapy: Secondary | ICD-10-CM | POA: Diagnosis not present

## 2019-09-23 DIAGNOSIS — M7061 Trochanteric bursitis, right hip: Secondary | ICD-10-CM | POA: Diagnosis not present

## 2019-09-23 DIAGNOSIS — M25551 Pain in right hip: Secondary | ICD-10-CM | POA: Diagnosis not present

## 2019-09-23 DIAGNOSIS — M5442 Lumbago with sciatica, left side: Secondary | ICD-10-CM | POA: Diagnosis not present

## 2019-09-24 DIAGNOSIS — Z7901 Long term (current) use of anticoagulants: Secondary | ICD-10-CM | POA: Diagnosis not present

## 2019-09-24 DIAGNOSIS — D689 Coagulation defect, unspecified: Secondary | ICD-10-CM | POA: Diagnosis not present

## 2019-09-24 DIAGNOSIS — Z79899 Other long term (current) drug therapy: Secondary | ICD-10-CM | POA: Diagnosis not present

## 2019-10-01 DIAGNOSIS — D689 Coagulation defect, unspecified: Secondary | ICD-10-CM | POA: Diagnosis not present

## 2019-10-01 DIAGNOSIS — Z79899 Other long term (current) drug therapy: Secondary | ICD-10-CM | POA: Diagnosis not present

## 2019-10-01 DIAGNOSIS — Z7901 Long term (current) use of anticoagulants: Secondary | ICD-10-CM | POA: Diagnosis not present

## 2019-10-09 DIAGNOSIS — Z7901 Long term (current) use of anticoagulants: Secondary | ICD-10-CM | POA: Diagnosis not present

## 2019-10-09 DIAGNOSIS — Z79899 Other long term (current) drug therapy: Secondary | ICD-10-CM | POA: Diagnosis not present

## 2019-10-09 DIAGNOSIS — D689 Coagulation defect, unspecified: Secondary | ICD-10-CM | POA: Diagnosis not present

## 2019-10-22 DIAGNOSIS — D689 Coagulation defect, unspecified: Secondary | ICD-10-CM | POA: Diagnosis not present

## 2019-10-22 DIAGNOSIS — Z7901 Long term (current) use of anticoagulants: Secondary | ICD-10-CM | POA: Diagnosis not present

## 2019-11-01 ENCOUNTER — Other Ambulatory Visit: Payer: Self-pay | Admitting: Cardiology

## 2019-11-01 DIAGNOSIS — I209 Angina pectoris, unspecified: Secondary | ICD-10-CM

## 2019-11-12 DIAGNOSIS — Z7901 Long term (current) use of anticoagulants: Secondary | ICD-10-CM | POA: Diagnosis not present

## 2019-11-12 DIAGNOSIS — D689 Coagulation defect, unspecified: Secondary | ICD-10-CM | POA: Diagnosis not present

## 2019-11-18 DIAGNOSIS — M25551 Pain in right hip: Secondary | ICD-10-CM | POA: Diagnosis not present

## 2019-11-18 DIAGNOSIS — M5440 Lumbago with sciatica, unspecified side: Secondary | ICD-10-CM | POA: Diagnosis not present

## 2019-11-18 DIAGNOSIS — Z720 Tobacco use: Secondary | ICD-10-CM | POA: Diagnosis not present

## 2019-11-18 DIAGNOSIS — M25552 Pain in left hip: Secondary | ICD-10-CM | POA: Diagnosis not present

## 2019-11-18 DIAGNOSIS — M5417 Radiculopathy, lumbosacral region: Secondary | ICD-10-CM | POA: Diagnosis not present

## 2019-12-10 DIAGNOSIS — Z7901 Long term (current) use of anticoagulants: Secondary | ICD-10-CM | POA: Diagnosis not present

## 2019-12-11 DIAGNOSIS — M47816 Spondylosis without myelopathy or radiculopathy, lumbar region: Secondary | ICD-10-CM | POA: Diagnosis not present

## 2019-12-21 ENCOUNTER — Other Ambulatory Visit: Payer: Self-pay | Admitting: *Deleted

## 2019-12-21 DIAGNOSIS — I708 Atherosclerosis of other arteries: Secondary | ICD-10-CM

## 2019-12-24 ENCOUNTER — Other Ambulatory Visit: Payer: Self-pay

## 2019-12-24 ENCOUNTER — Ambulatory Visit (INDEPENDENT_AMBULATORY_CARE_PROVIDER_SITE_OTHER): Payer: Medicare Other | Admitting: Physician Assistant

## 2019-12-24 ENCOUNTER — Ambulatory Visit (HOSPITAL_COMMUNITY)
Admission: RE | Admit: 2019-12-24 | Discharge: 2019-12-24 | Disposition: A | Discharge status: Home / Self Care | Payer: Medicare Other | Source: Ambulatory Visit | Attending: Vascular Surgery | Admitting: Vascular Surgery

## 2019-12-24 VITALS — BP 150/69 | HR 68 | Temp 98.2°F | Resp 18 | Ht 64.0 in | Wt 161.0 lb

## 2019-12-24 DIAGNOSIS — I708 Atherosclerosis of other arteries: Secondary | ICD-10-CM

## 2019-12-24 DIAGNOSIS — F17200 Nicotine dependence, unspecified, uncomplicated: Secondary | ICD-10-CM

## 2019-12-24 DIAGNOSIS — K551 Chronic vascular disorders of intestine: Secondary | ICD-10-CM

## 2019-12-24 NOTE — Progress Notes (Signed)
Established Mesenteric Ischemia   History of Present Illness   Danielle Barrett is a 59 y.o. (December 02, 1960) female who presents to go over vascular studies related to mesenteric stenosis.  Surgical history significant for aorto to SMA and celiac artery bypass in 2004.  She subsequently had a thrombectomy of the SMA portion of this bypass in 2008.  Celiac artery bypass branch is known to be occluded.  She denies any postprandial pain, weight loss, or fear of food.  She also denies any claudication or nonhealing wounds of bilateral lower extremities.  She is taking an aspirin and statin daily.  She is also on Coumadin.  She unfortunately is still smoking and lives with her husband and her son who also smoke.  The patient's PMH, PSH, SH, and FamHx were reviewed and are unchanged from prior visit.  Current Outpatient Medications  Medication Sig Dispense Refill  . albuterol (PROVENTIL HFA;VENTOLIN HFA) 108 (90 BASE) MCG/ACT inhaler Inhale 2 puffs into the lungs every 6 (six) hours as needed. Shortness of breath    . ALPRAZolam (XANAX) 0.5 MG tablet Take 0.5 mg by mouth 3 (three) times daily as needed for anxiety or sleep.     Marland Kitchen aspirin EC 81 MG tablet Take 1 tablet (81 mg total) by mouth daily.    Marland Kitchen atorvastatin (LIPITOR) 40 MG tablet Take 1 tablet (40 mg total) by mouth daily. Please make yearly appt with Dr. Marlou Porch for June before anymore refills. 1st attempt 90 tablet 0  . bisacodyl (DULCOLAX) 5 MG EC tablet Take 5 mg by mouth daily.    . cetirizine (ZYRTEC) 10 MG tablet Take 10 mg by mouth daily.    Marland Kitchen diltiazem (CARDIZEM CD) 120 MG 24 hr capsule Take 1 capsule (120 mg total) by mouth daily. Please make yearly appt with Dr. Marlou Porch for June before anymore refills. 1st attempt 90 capsule 0  . isosorbide mononitrate (IMDUR) 30 MG 24 hr tablet Take 15 mg by mouth daily.    Marland Kitchen levothyroxine (SYNTHROID) 100 MCG tablet Take 100 mcg by mouth daily.    Marland Kitchen lisinopril-hydrochlorothiazide (PRINZIDE,ZESTORETIC)  20-25 MG per tablet Take 1 tablet by mouth every morning.    . methocarbamol (ROBAXIN) 750 MG tablet Take 750 mg by mouth 3 (three) times daily as needed for muscle spasms.     Marland Kitchen morphine (MS CONTIN) 15 MG 12 hr tablet Take 15 mg by mouth 3 (three) times daily.    . naloxone (NARCAN) nasal spray 4 mg/0.1 mL Place 1 spray into the nose as needed.    . nitroGLYCERIN (NITROQUICK) 0.4 MG SL tablet Place 0.4 mg under the tongue every 5 (five) minutes as needed. Chest pain    . Propylene Glycol (SYSTANE BALANCE OP) Apply 1 drop to eye as needed. For dry eyes    . venlafaxine (EFFEXOR) 37.5 MG tablet Take 37.5 mg by mouth 2 (two) times daily. Reported on 11/01/2015    . warfarin (COUMADIN) 7.5 MG tablet Take 7 mg by mouth daily. Monday Tuesday,wednesday, Friday is 5 mg. 7.5 mg on Thursday, Saturday, Sunday. Last INR check was on 10-23-13     No current facility-administered medications for this visit.    REVIEW OF SYSTEMS (negative unless checked):   Cardiac:  []  Chest pain or chest pressure? []  Shortness of breath upon activity? []  Shortness of breath when lying flat? []  Irregular heart rhythm?  Vascular:  []  Pain in calf, thigh, or hip brought on by walking? []  Pain in feet  at night that wakes you up from your sleep? []  Blood clot in your veins? []  Leg swelling?  Pulmonary:  []  Oxygen at home? []  Productive cough? []  Wheezing?  Neurologic:  []  Sudden weakness in arms or legs? []  Sudden numbness in arms or legs? []  Sudden onset of difficult speaking or slurred speech? []  Temporary loss of vision in one eye? []  Problems with dizziness?  Gastrointestinal:  []  Blood in stool? []  Vomited blood?  Genitourinary:  []  Burning when urinating? []  Blood in urine?  Psychiatric:  []  Major depression  Hematologic:  []  Bleeding problems? []  Problems with blood clotting?  Dermatologic:  []  Rashes or ulcers?  Constitutional:  []  Fever or chills?  Ear/Nose/Throat:  []  Change in  hearing? []  Nose bleeds? []  Sore throat?  Musculoskeletal:  []  Back pain? []  Joint pain? []  Muscle pain?   Physical Examination   Vitals:   12/24/19 0854  BP: (!) 150/69  Pulse: 68  Resp: 18  Temp: 98.2 F (36.8 C)  TempSrc: Temporal  SpO2: 93%  Weight: 161 lb (73 kg)  Height: 5\' 4"  (1.626 m)   Body mass index is 27.64 kg/m.  General:  WDWN in NAD; vital signs documented above Gait: Not observed HENT: WNL, normocephalic Pulmonary: normal non-labored breathing Cardiac: regular HR Abdomen: soft, NT, no masses Skin: without rashes Vascular Exam/Pulses:  Right Left  Radial 2+ (normal) 2+ (normal)  Ulnar absent absent  Femoral 2+ (normal) 2+ (normal)   Extremities: without ischemic changes, without Gangrene , without cellulitis; without open wounds;  Musculoskeletal: no muscle wasting or atrophy  Neurologic: A&O X 3;  No focal weakness or paresthesias are detected Psychiatric:  The pt has Normal affect.  Non-Invasive Vascular Imaging   Mesenteric Duplex:   Duplex unchanged over the past year  Peak systolic velocity of 0000000 cm/s at the distal SMA  50 to 70% stenosis at the proximal anastomosis of the SMA bypass graft  Chronically occluded celiac bypass branch   Medical Decision Making    Danielle Barrett is a 59 y.o. female who presents for surveillance of mesenteric ischemia with mesenteric duplex  -Patent aorta to SMA bypass graft with out change in velocities compared to last year's study -Patient denies any unintentional weight loss, postprandial pain, or fear of food -She continues her Coumadin, aspirin, and statin daily -Encouraged smoking cessation -Recheck mesenteric duplex in 1 year   Dagoberto Ligas PA-C [facility] Office: [phone]  Clinic MD: Oneida Alar

## 2019-12-28 ENCOUNTER — Other Ambulatory Visit: Payer: Self-pay | Admitting: *Deleted

## 2019-12-28 DIAGNOSIS — K551 Chronic vascular disorders of intestine: Secondary | ICD-10-CM

## 2020-01-08 DIAGNOSIS — Z7901 Long term (current) use of anticoagulants: Secondary | ICD-10-CM | POA: Diagnosis not present

## 2020-01-13 DIAGNOSIS — M47816 Spondylosis without myelopathy or radiculopathy, lumbar region: Secondary | ICD-10-CM | POA: Diagnosis not present

## 2020-01-13 DIAGNOSIS — M1612 Unilateral primary osteoarthritis, left hip: Secondary | ICD-10-CM | POA: Diagnosis not present

## 2020-01-13 DIAGNOSIS — M7061 Trochanteric bursitis, right hip: Secondary | ICD-10-CM | POA: Diagnosis not present

## 2020-01-13 DIAGNOSIS — R102 Pelvic and perineal pain: Secondary | ICD-10-CM | POA: Diagnosis not present

## 2020-01-13 DIAGNOSIS — M5412 Radiculopathy, cervical region: Secondary | ICD-10-CM | POA: Diagnosis not present

## 2020-02-03 DIAGNOSIS — Z7901 Long term (current) use of anticoagulants: Secondary | ICD-10-CM | POA: Diagnosis not present

## 2020-02-25 ENCOUNTER — Other Ambulatory Visit: Payer: Self-pay | Admitting: Cardiology

## 2020-02-29 ENCOUNTER — Other Ambulatory Visit: Payer: Self-pay

## 2020-02-29 ENCOUNTER — Ambulatory Visit (INDEPENDENT_AMBULATORY_CARE_PROVIDER_SITE_OTHER): Payer: Medicare Other | Admitting: Cardiology

## 2020-02-29 ENCOUNTER — Encounter: Payer: Self-pay | Admitting: Cardiology

## 2020-02-29 VITALS — BP 140/60 | HR 73 | Ht 64.0 in | Wt 161.0 lb

## 2020-02-29 DIAGNOSIS — I739 Peripheral vascular disease, unspecified: Secondary | ICD-10-CM | POA: Diagnosis not present

## 2020-02-29 DIAGNOSIS — I708 Atherosclerosis of other arteries: Secondary | ICD-10-CM

## 2020-02-29 DIAGNOSIS — I2510 Atherosclerotic heart disease of native coronary artery without angina pectoris: Secondary | ICD-10-CM

## 2020-02-29 DIAGNOSIS — D6859 Other primary thrombophilia: Secondary | ICD-10-CM

## 2020-02-29 DIAGNOSIS — I209 Angina pectoris, unspecified: Secondary | ICD-10-CM | POA: Diagnosis not present

## 2020-02-29 NOTE — Progress Notes (Signed)
Cardiology Office Note:    Date:  02/29/2020   ID:  Danielle Barrett, DOB July 22, 1961, MRN [id number]  PCP:  Danielle Carol, MD  [facility] Cardiologist:  Danielle Furbish, MD  [facility] Electrophysiologist:  None   Referring MD: Danielle Carol, MD     History of Present Illness:    Danielle Barrett is a 59 y.o. female here for follow-up of peripheral vascular disease, prior mesenteric ischemia, prior SMA bypass, prior heparin-induced thrombocytopenia, CAD with Cutting Balloon angioplasty in June 2011 no stent placed.  No postprandial pain.  No claudication.  Seeing pain medicine clinic for back pain.  Continues to smoke.  Past Medical History:  Diagnosis Date  . Antithrombin 3 deficiency ([place])   . Asthma   . Cancer ([place])   . Coronary artery disease   . Grave's disease   . Grave's disease   . Hyperlipidemia   . Hypertension   . Peripheral vascular disease [facility])     Past Surgical History:  Procedure Laterality Date  . ABDOMINAL HYSTERECTOMY    . ABDOMINAL SURGERY    . aorto to celiac and superior mesenteric artery bypass  11/22/2003  . COLON SURGERY    . coronary artery angio  02/2010  . thrombectomy of SMA  01/2007    Current Medications: No outpatient medications have been marked as taking for the 02/29/20 encounter (Office Visit) with [name], MD.     Allergies:   Amitriptyline, Topiramate, Eggs or egg-derived products, Heparin, Penicillins, and Poultry meal   Social History   Socioeconomic History  . Marital status: Married    Spouse name: Not on file  . Number of children: Not on file  . Years of education: Not on file  . Highest education level: Not on file  Occupational History  . Not on file  Tobacco Use  . Smoking status: Current Every Day Smoker    Packs/day: 1.00    Types: Cigarettes  . Smokeless tobacco: Never Used  Vaping Use  . Vaping Use: Former  Substance and Sexual Activity  . Alcohol use: Yes    Alcohol/week: 0.0 standard  drinks    Comment: occasional  . Drug use: No  . Sexual activity: Yes    Birth control/protection: Surgical  Other Topics Concern  . Not on file  Social History Narrative  . Not on file   Social Determinants of Health   Financial Resource Strain:   . Difficulty of Paying Living Expenses:   Food Insecurity:   . Worried About Charity fundraiser in the Last Year:   . Arboriculturist in the Last Year:   Transportation Needs:   . Film/video editor (Medical):   Marland Kitchen Lack of Transportation (Non-Medical):   Physical Activity:   . Days of Exercise per Week:   . Minutes of Exercise per Session:   Stress:   . Feeling of Stress :   Social Connections:   . Frequency of Communication with Friends and Family:   . Frequency of Social Gatherings with Friends and Family:   . Attends Religious Services:   . Active Member of Clubs or Organizations:   . Attends Archivist Meetings:   Marland Kitchen Marital Status:      Family History: The patient's family history includes Diabetes in her father; Heart attack in her mother; Heart disease in her father and mother; Hypertension in her father and mother; Stroke in her father.  ROS:   Please see the  history of present illness.     All other systems reviewed and are negative.  EKGs/Labs/Other Studies Reviewed:    The following studies were reviewed today:  02/10/10 - Cath -PTCA only to 90% ostial diagonal branch.  40% mid LAD stenosis residual.  Small vessel obtuse marginal disease also noted.  Nuclear stress test 02/27/2019: -Reassuring low risk no ischemia   EKG:  EKG is  ordered today.  The ekg ordered today demonstrates sinus rhythm 73 with nonspecific ST-T wave flattening.  Recent Labs: No results found for requested labs within last 8760 hours.  Recent Lipid Panel    Component Value Date/Time   CHOL (H) 12/22/2009 0635    238        ATP III CLASSIFICATION:  <200     mg/dL   Desirable  200-239  mg/dL   Borderline High  >=240     mg/dL   High          TRIG 382 (H) 12/22/2009 0635   HDL 30 (L) 12/22/2009 0635   CHOLHDL 7.9 12/22/2009 0635   VLDL 76 (H) 12/22/2009 0635   LDLCALC (H) 12/22/2009 0635    132        Total Cholesterol/HDL:CHD Risk Coronary Heart Disease Risk Table                     Men   Women  1/2 Average Risk   3.4   3.3  Average Risk       5.0   4.4  2 X Average Risk   9.6   7.1  3 X Average Risk  23.4   11.0        Use the calculated Patient Ratio above and the CHD Risk Table to determine the patient's CHD Risk.        ATP III CLASSIFICATION (LDL):  <100     mg/dL   Optimal  100-129  mg/dL   Near or Above                    Optimal  130-159  mg/dL   Borderline  160-189  mg/dL   High  >190     mg/dL   Very High    Physical Exam:    VS:  BP 140/60   Pulse 73   Ht 5\' 4"  (1.626 m)   Wt 161 lb (73 kg)   LMP  (LMP Unknown)   SpO2 94%   BMI 27.64 kg/m     Wt Readings from Last 3 Encounters:  02/29/20 161 lb (73 kg)  12/24/19 161 lb (73 kg)  02/27/19 167 lb (75.8 kg)     GEN:  Well nourished, well developed in no acute distress HEENT: Normal NECK: No JVD; No carotid bruits LYMPHATICS: No lymphadenopathy CARDIAC: RRR, no murmurs, rubs, gallops RESPIRATORY:  Clear to auscultation without rales, wheezing or rhonchi  ABDOMEN: Soft, non-tender, non-distended MUSCULOSKELETAL:  No edema; No deformity  SKIN: Warm and dry NEUROLOGIC:  Alert and oriented x 3 PSYCHIATRIC:  Normal affect   ASSESSMENT:    1. Coronary artery disease involving native coronary artery of native heart without angina pectoris   2. PVD (peripheral vascular disease) ([place])   3. Angina pectoris ([place])   4. Antithrombin 3 deficiency ([place])   5. Occlusion of celiac artery    PLAN:    In order of problems listed above:  Coronary artery disease -June 2011, no PCI but Cutting Balloon did take place.  Continue with aggressive secondary risk factor prevention.  Angina -Had headaches on isosorbide.  Overall  angina has improved.  Peripheral vascular disease with prior mesenteric ischemia and Antithrombin III deficiency/SMA bypass by Dr. Oneida Alar -Followed by hematology at [place] with Coumadin followed there. -Chronic celiac branch occlusion -No postprandial pain.  Overall doing well.  Tobacco use -Continue to encourage cessation.  Still having a lot of stress.  Her son and his kids have moved in.  Increased financial stress.  One of his children is 54 years old and has autism.  Back pain -Having procedure soon, nerve ablation.  Status post bowel resection -Having GI evaluation soon.   Medication Adjustments/Labs and Tests Ordered: Current medicines are reviewed at length with the patient today.  Concerns regarding medicines are outlined above.  Orders Placed This Encounter  Procedures  . EKG 12-Lead   No orders of the defined types were placed in this encounter.   Patient Instructions  Medication Instructions:  The current medical regimen is effective;  continue present plan and medications.  *If you need a refill on your cardiac medications before your next appointment, please call your pharmacy*  Follow-Up: At [facility], you and your health needs are our priority.  As part of our continuing mission to provide you with exceptional heart care, we have created designated Provider Care Teams.  These Care Teams include your primary Cardiologist (physician) and Advanced Practice Providers (APPs -  Physician Assistants and Nurse Practitioners) who all work together to provide you with the care you need, when you need it.  We recommend signing up for the patient portal called "MyChart".  Sign up information is provided on this After Visit Summary.  MyChart is used to connect with patients for Virtual Visits (Telemedicine).  Patients are able to view lab/test results, encounter notes, upcoming appointments, etc.  Non-urgent messages can be sent to your provider as well.   To  learn more about what you can do with MyChart, go to [email or web address].    Your next appointment:   12 month(s)  The format for your next appointment:   In Person  Provider:   Candee Furbish, MD   Thank you for choosing [facility]!!         Signed, Danielle Furbish, MD  02/29/2020 10:22 AM    [place]

## 2020-02-29 NOTE — Patient Instructions (Signed)
Medication Instructions:  The current medical regimen is effective;  continue present plan and medications.  *If you need a refill on your cardiac medications before your next appointment, please call your pharmacy*  Follow-Up: At [facility], you and your health needs are our priority.  As part of our continuing mission to provide you with exceptional heart care, we have created designated Provider Care Teams.  These Care Teams include your primary Cardiologist (physician) and Advanced Practice Providers (APPs -  Physician Assistants and Nurse Practitioners) who all work together to provide you with the care you need, when you need it.  We recommend signing up for the patient portal called "MyChart".  Sign up information is provided on this After Visit Summary.  MyChart is used to connect with patients for Virtual Visits (Telemedicine).  Patients are able to view lab/test results, encounter notes, upcoming appointments, etc.  Non-urgent messages can be sent to your provider as well.   To learn more about what you can do with MyChart, go to [email or web address].    Your next appointment:   12 month(s)  The format for your next appointment:   In Person  Provider:   Mark Skains, MD   Thank you for choosing [facility]!!      

## 2020-03-02 DIAGNOSIS — Z7901 Long term (current) use of anticoagulants: Secondary | ICD-10-CM | POA: Diagnosis not present

## 2020-03-07 DIAGNOSIS — M47816 Spondylosis without myelopathy or radiculopathy, lumbar region: Secondary | ICD-10-CM | POA: Diagnosis not present

## 2020-03-07 DIAGNOSIS — F411 Generalized anxiety disorder: Secondary | ICD-10-CM | POA: Diagnosis not present

## 2020-03-21 DIAGNOSIS — F411 Generalized anxiety disorder: Secondary | ICD-10-CM | POA: Diagnosis not present

## 2020-03-21 DIAGNOSIS — M47816 Spondylosis without myelopathy or radiculopathy, lumbar region: Secondary | ICD-10-CM | POA: Diagnosis not present

## 2020-03-24 ENCOUNTER — Other Ambulatory Visit: Payer: Self-pay | Admitting: Cardiology

## 2020-03-24 DIAGNOSIS — I209 Angina pectoris, unspecified: Secondary | ICD-10-CM

## 2020-04-05 DIAGNOSIS — E039 Hypothyroidism, unspecified: Secondary | ICD-10-CM | POA: Diagnosis not present

## 2020-04-06 DIAGNOSIS — N94819 Vulvodynia, unspecified: Secondary | ICD-10-CM | POA: Diagnosis not present

## 2020-04-06 DIAGNOSIS — G894 Chronic pain syndrome: Secondary | ICD-10-CM | POA: Diagnosis not present

## 2020-04-06 DIAGNOSIS — M47816 Spondylosis without myelopathy or radiculopathy, lumbar region: Secondary | ICD-10-CM | POA: Diagnosis not present

## 2020-04-06 DIAGNOSIS — Z79899 Other long term (current) drug therapy: Secondary | ICD-10-CM | POA: Diagnosis not present

## 2020-04-06 DIAGNOSIS — R102 Pelvic and perineal pain: Secondary | ICD-10-CM | POA: Diagnosis not present

## 2020-04-06 DIAGNOSIS — Z5181 Encounter for therapeutic drug level monitoring: Secondary | ICD-10-CM | POA: Diagnosis not present

## 2020-04-07 DIAGNOSIS — Z7901 Long term (current) use of anticoagulants: Secondary | ICD-10-CM | POA: Diagnosis not present

## 2020-04-12 DIAGNOSIS — K559 Vascular disorder of intestine, unspecified: Secondary | ICD-10-CM | POA: Diagnosis not present

## 2020-04-12 DIAGNOSIS — K625 Hemorrhage of anus and rectum: Secondary | ICD-10-CM | POA: Diagnosis not present

## 2020-04-12 DIAGNOSIS — I739 Peripheral vascular disease, unspecified: Secondary | ICD-10-CM | POA: Diagnosis not present

## 2020-04-12 DIAGNOSIS — D6859 Other primary thrombophilia: Secondary | ICD-10-CM | POA: Diagnosis not present

## 2020-04-12 DIAGNOSIS — R195 Other fecal abnormalities: Secondary | ICD-10-CM | POA: Diagnosis not present

## 2020-04-12 DIAGNOSIS — R143 Flatulence: Secondary | ICD-10-CM | POA: Diagnosis not present

## 2020-04-12 DIAGNOSIS — Z9049 Acquired absence of other specified parts of digestive tract: Secondary | ICD-10-CM | POA: Diagnosis not present

## 2020-04-12 DIAGNOSIS — Z8679 Personal history of other diseases of the circulatory system: Secondary | ICD-10-CM | POA: Diagnosis not present

## 2020-04-26 DIAGNOSIS — H16142 Punctate keratitis, left eye: Secondary | ICD-10-CM | POA: Diagnosis not present

## 2020-04-28 DIAGNOSIS — H16142 Punctate keratitis, left eye: Secondary | ICD-10-CM | POA: Diagnosis not present

## 2020-05-05 DIAGNOSIS — Z7901 Long term (current) use of anticoagulants: Secondary | ICD-10-CM | POA: Diagnosis not present

## 2020-05-09 DIAGNOSIS — R05 Cough: Secondary | ICD-10-CM | POA: Diagnosis not present

## 2020-05-12 ENCOUNTER — Other Ambulatory Visit (HOSPITAL_COMMUNITY): Payer: Self-pay

## 2020-05-25 ENCOUNTER — Other Ambulatory Visit: Payer: Self-pay | Admitting: Cardiology

## 2020-06-01 DIAGNOSIS — M47816 Spondylosis without myelopathy or radiculopathy, lumbar region: Secondary | ICD-10-CM | POA: Diagnosis not present

## 2020-06-01 DIAGNOSIS — C549 Malignant neoplasm of corpus uteri, unspecified: Secondary | ICD-10-CM | POA: Diagnosis not present

## 2020-06-01 DIAGNOSIS — M7061 Trochanteric bursitis, right hip: Secondary | ICD-10-CM | POA: Diagnosis not present

## 2020-06-01 DIAGNOSIS — M5417 Radiculopathy, lumbosacral region: Secondary | ICD-10-CM | POA: Diagnosis not present

## 2020-06-01 DIAGNOSIS — M7062 Trochanteric bursitis, left hip: Secondary | ICD-10-CM | POA: Diagnosis not present

## 2020-06-03 DIAGNOSIS — Z7901 Long term (current) use of anticoagulants: Secondary | ICD-10-CM | POA: Diagnosis not present

## 2020-06-28 DIAGNOSIS — M533 Sacrococcygeal disorders, not elsewhere classified: Secondary | ICD-10-CM | POA: Diagnosis not present

## 2020-07-01 DIAGNOSIS — D689 Coagulation defect, unspecified: Secondary | ICD-10-CM | POA: Diagnosis not present

## 2020-07-01 DIAGNOSIS — Z7901 Long term (current) use of anticoagulants: Secondary | ICD-10-CM | POA: Diagnosis not present

## 2020-07-06 DIAGNOSIS — Z7901 Long term (current) use of anticoagulants: Secondary | ICD-10-CM | POA: Diagnosis not present

## 2020-07-12 DIAGNOSIS — Z7901 Long term (current) use of anticoagulants: Secondary | ICD-10-CM | POA: Diagnosis not present

## 2020-07-12 DIAGNOSIS — D689 Coagulation defect, unspecified: Secondary | ICD-10-CM | POA: Diagnosis not present

## 2020-07-19 DIAGNOSIS — Z7901 Long term (current) use of anticoagulants: Secondary | ICD-10-CM | POA: Diagnosis not present

## 2020-07-19 DIAGNOSIS — D689 Coagulation defect, unspecified: Secondary | ICD-10-CM | POA: Diagnosis not present

## 2020-07-26 DIAGNOSIS — Z7901 Long term (current) use of anticoagulants: Secondary | ICD-10-CM | POA: Diagnosis not present

## 2020-07-26 DIAGNOSIS — D689 Coagulation defect, unspecified: Secondary | ICD-10-CM | POA: Diagnosis not present

## 2020-07-28 DIAGNOSIS — M47816 Spondylosis without myelopathy or radiculopathy, lumbar region: Secondary | ICD-10-CM | POA: Diagnosis not present

## 2020-07-28 DIAGNOSIS — M533 Sacrococcygeal disorders, not elsewhere classified: Secondary | ICD-10-CM | POA: Diagnosis not present

## 2020-07-28 DIAGNOSIS — M79671 Pain in right foot: Secondary | ICD-10-CM | POA: Diagnosis not present

## 2020-07-28 DIAGNOSIS — M4317 Spondylolisthesis, lumbosacral region: Secondary | ICD-10-CM | POA: Diagnosis not present

## 2020-07-28 DIAGNOSIS — Z720 Tobacco use: Secondary | ICD-10-CM | POA: Diagnosis not present

## 2020-07-28 DIAGNOSIS — M5417 Radiculopathy, lumbosacral region: Secondary | ICD-10-CM | POA: Diagnosis not present

## 2020-07-28 DIAGNOSIS — M7061 Trochanteric bursitis, right hip: Secondary | ICD-10-CM | POA: Diagnosis not present

## 2020-07-28 DIAGNOSIS — M5440 Lumbago with sciatica, unspecified side: Secondary | ICD-10-CM | POA: Diagnosis not present

## 2020-07-28 DIAGNOSIS — M5442 Lumbago with sciatica, left side: Secondary | ICD-10-CM | POA: Diagnosis not present

## 2020-07-28 DIAGNOSIS — F419 Anxiety disorder, unspecified: Secondary | ICD-10-CM | POA: Diagnosis not present

## 2020-07-28 DIAGNOSIS — M25551 Pain in right hip: Secondary | ICD-10-CM | POA: Diagnosis not present

## 2020-07-28 DIAGNOSIS — C549 Malignant neoplasm of corpus uteri, unspecified: Secondary | ICD-10-CM | POA: Diagnosis not present

## 2020-08-02 DIAGNOSIS — D689 Coagulation defect, unspecified: Secondary | ICD-10-CM | POA: Diagnosis not present

## 2020-08-02 DIAGNOSIS — Z7901 Long term (current) use of anticoagulants: Secondary | ICD-10-CM | POA: Diagnosis not present

## 2020-08-09 DIAGNOSIS — D689 Coagulation defect, unspecified: Secondary | ICD-10-CM | POA: Diagnosis not present

## 2020-08-09 DIAGNOSIS — Z7901 Long term (current) use of anticoagulants: Secondary | ICD-10-CM | POA: Diagnosis not present

## 2020-08-16 DIAGNOSIS — Z7901 Long term (current) use of anticoagulants: Secondary | ICD-10-CM | POA: Diagnosis not present

## 2020-08-16 DIAGNOSIS — D689 Coagulation defect, unspecified: Secondary | ICD-10-CM | POA: Diagnosis not present

## 2020-08-17 ENCOUNTER — Other Ambulatory Visit: Payer: Self-pay | Admitting: Cardiology

## 2020-09-22 DIAGNOSIS — M7061 Trochanteric bursitis, right hip: Secondary | ICD-10-CM | POA: Diagnosis not present

## 2020-09-22 DIAGNOSIS — M47816 Spondylosis without myelopathy or radiculopathy, lumbar region: Secondary | ICD-10-CM | POA: Diagnosis not present

## 2020-09-22 DIAGNOSIS — Z5181 Encounter for therapeutic drug level monitoring: Secondary | ICD-10-CM | POA: Diagnosis not present

## 2020-09-22 DIAGNOSIS — M4317 Spondylolisthesis, lumbosacral region: Secondary | ICD-10-CM | POA: Diagnosis not present

## 2020-09-22 DIAGNOSIS — R69 Illness, unspecified: Secondary | ICD-10-CM | POA: Diagnosis not present

## 2020-09-22 DIAGNOSIS — M5442 Lumbago with sciatica, left side: Secondary | ICD-10-CM | POA: Diagnosis not present

## 2020-09-22 DIAGNOSIS — N94819 Vulvodynia, unspecified: Secondary | ICD-10-CM | POA: Diagnosis not present

## 2020-09-22 DIAGNOSIS — M25551 Pain in right hip: Secondary | ICD-10-CM | POA: Diagnosis not present

## 2020-09-22 DIAGNOSIS — Z79899 Other long term (current) drug therapy: Secondary | ICD-10-CM | POA: Diagnosis not present

## 2020-09-22 DIAGNOSIS — Z720 Tobacco use: Secondary | ICD-10-CM | POA: Diagnosis not present

## 2020-09-22 DIAGNOSIS — Z7901 Long term (current) use of anticoagulants: Secondary | ICD-10-CM | POA: Diagnosis not present

## 2020-09-22 DIAGNOSIS — M5417 Radiculopathy, lumbosacral region: Secondary | ICD-10-CM | POA: Diagnosis not present

## 2020-10-04 DIAGNOSIS — Z7901 Long term (current) use of anticoagulants: Secondary | ICD-10-CM | POA: Diagnosis not present

## 2021-02-09 ENCOUNTER — Other Ambulatory Visit: Payer: Self-pay | Admitting: Cardiology

## 2021-05-06 ENCOUNTER — Other Ambulatory Visit: Payer: Self-pay | Admitting: Cardiology

## 2021-05-22 ENCOUNTER — Other Ambulatory Visit: Payer: Self-pay | Admitting: Cardiology

## 2022-04-06 ENCOUNTER — Telehealth: Payer: Self-pay | Admitting: Cardiology

## 2022-04-06 NOTE — Telephone Encounter (Signed)
Patient has moved and has a new cardiologist. Declines scheduling.

## 2023-08-21 DEATH — deceased
